# Patient Record
Sex: Female | Born: 1970 | Race: White | Hispanic: No | Marital: Married | Smoking: Never smoker
Health system: Southern US, Community
[De-identification: ages and names within clinical notes are randomized; demographics above are authoritative.]

## PROBLEM LIST (undated history)

## (undated) DIAGNOSIS — R32 Unspecified urinary incontinence: Secondary | ICD-10-CM

## (undated) DIAGNOSIS — J302 Other seasonal allergic rhinitis: Secondary | ICD-10-CM

## (undated) DIAGNOSIS — N029 Recurrent and persistent hematuria with unspecified morphologic changes: Secondary | ICD-10-CM

## (undated) DIAGNOSIS — R12 Heartburn: Secondary | ICD-10-CM

## (undated) DIAGNOSIS — K589 Irritable bowel syndrome without diarrhea: Secondary | ICD-10-CM

## (undated) HISTORY — DX: Recurrent and persistent hematuria with unspecified morphologic changes: N02.9

## (undated) HISTORY — DX: Unspecified urinary incontinence: R32

## (undated) HISTORY — DX: Irritable bowel syndrome without diarrhea: K58.9

## (undated) HISTORY — DX: Heartburn: R12

## (undated) HISTORY — DX: Other seasonal allergic rhinitis: J30.2

---

## 1991-03-08 HISTORY — PX: WISDOM TOOTH EXTRACTION: SHX21

## 1998-03-07 HISTORY — PX: COLONOSCOPY: SHX174

## 2007-08-06 HISTORY — PX: DILATION AND CURETTAGE OF UTERUS: SHX78

## 2008-08-05 ENCOUNTER — Other Ambulatory Visit: Admission: RE | Admit: 2008-08-05 | Discharge: 2008-08-05 | Payer: Self-pay | Admitting: Obstetrics and Gynecology

## 2009-10-01 ENCOUNTER — Other Ambulatory Visit: Admission: RE | Admit: 2009-10-01 | Discharge: 2009-10-01 | Payer: Self-pay | Admitting: Obstetrics and Gynecology

## 2010-07-07 ENCOUNTER — Other Ambulatory Visit (HOSPITAL_COMMUNITY)
Admission: RE | Admit: 2010-07-07 | Discharge: 2010-07-07 | Disposition: A | Discharge status: Home / Self Care | Payer: Managed Care, Other (non HMO) | Source: Ambulatory Visit | Attending: Obstetrics and Gynecology | Admitting: Obstetrics and Gynecology

## 2010-07-07 DIAGNOSIS — Z01419 Encounter for gynecological examination (general) (routine) without abnormal findings: Secondary | ICD-10-CM | POA: Insufficient documentation

## 2010-11-05 ENCOUNTER — Other Ambulatory Visit: Payer: Self-pay | Admitting: Obstetrics and Gynecology

## 2010-11-05 DIAGNOSIS — Z1231 Encounter for screening mammogram for malignant neoplasm of breast: Secondary | ICD-10-CM

## 2010-11-24 ENCOUNTER — Ambulatory Visit
Admission: RE | Admit: 2010-11-24 | Discharge: 2010-11-24 | Disposition: A | Discharge status: Home / Self Care | Payer: Managed Care, Other (non HMO) | Source: Ambulatory Visit | Attending: Obstetrics and Gynecology | Admitting: Obstetrics and Gynecology

## 2010-11-24 DIAGNOSIS — Z1231 Encounter for screening mammogram for malignant neoplasm of breast: Secondary | ICD-10-CM

## 2010-11-30 ENCOUNTER — Other Ambulatory Visit: Payer: Self-pay | Admitting: Obstetrics and Gynecology

## 2010-11-30 DIAGNOSIS — R928 Other abnormal and inconclusive findings on diagnostic imaging of breast: Secondary | ICD-10-CM

## 2010-12-09 ENCOUNTER — Ambulatory Visit
Admission: RE | Admit: 2010-12-09 | Discharge: 2010-12-09 | Disposition: A | Discharge status: Home / Self Care | Payer: Managed Care, Other (non HMO) | Source: Ambulatory Visit | Attending: Obstetrics and Gynecology | Admitting: Obstetrics and Gynecology

## 2010-12-09 DIAGNOSIS — R928 Other abnormal and inconclusive findings on diagnostic imaging of breast: Secondary | ICD-10-CM

## 2011-05-24 ENCOUNTER — Other Ambulatory Visit (HOSPITAL_COMMUNITY)
Admission: RE | Admit: 2011-05-24 | Discharge: 2011-05-24 | Disposition: A | Discharge status: Home / Self Care | Payer: Managed Care, Other (non HMO) | Source: Ambulatory Visit | Attending: Obstetrics and Gynecology | Admitting: Obstetrics and Gynecology

## 2011-05-24 DIAGNOSIS — Z01419 Encounter for gynecological examination (general) (routine) without abnormal findings: Secondary | ICD-10-CM | POA: Insufficient documentation

## 2011-11-10 ENCOUNTER — Other Ambulatory Visit: Payer: Self-pay | Admitting: Obstetrics and Gynecology

## 2011-11-11 ENCOUNTER — Other Ambulatory Visit: Payer: Self-pay | Admitting: Obstetrics and Gynecology

## 2011-11-11 DIAGNOSIS — Z1231 Encounter for screening mammogram for malignant neoplasm of breast: Secondary | ICD-10-CM

## 2012-01-10 ENCOUNTER — Ambulatory Visit
Admission: RE | Admit: 2012-01-10 | Discharge: 2012-01-10 | Disposition: A | Discharge status: Home / Self Care | Payer: Managed Care, Other (non HMO) | Source: Ambulatory Visit | Attending: Obstetrics and Gynecology | Admitting: Obstetrics and Gynecology

## 2012-01-10 DIAGNOSIS — Z1231 Encounter for screening mammogram for malignant neoplasm of breast: Secondary | ICD-10-CM

## 2012-10-04 ENCOUNTER — Other Ambulatory Visit: Payer: Self-pay | Admitting: Obstetrics and Gynecology

## 2012-10-04 ENCOUNTER — Other Ambulatory Visit (HOSPITAL_COMMUNITY)
Admission: RE | Admit: 2012-10-04 | Discharge: 2012-10-04 | Disposition: A | Discharge status: Home / Self Care | Payer: Managed Care, Other (non HMO) | Source: Ambulatory Visit | Attending: Obstetrics and Gynecology | Admitting: Obstetrics and Gynecology

## 2012-10-04 DIAGNOSIS — N644 Mastodynia: Secondary | ICD-10-CM

## 2012-10-04 DIAGNOSIS — Z1151 Encounter for screening for human papillomavirus (HPV): Secondary | ICD-10-CM | POA: Insufficient documentation

## 2012-10-04 DIAGNOSIS — Z01419 Encounter for gynecological examination (general) (routine) without abnormal findings: Secondary | ICD-10-CM | POA: Insufficient documentation

## 2012-10-19 ENCOUNTER — Ambulatory Visit
Admission: RE | Admit: 2012-10-19 | Discharge: 2012-10-19 | Disposition: A | Discharge status: Home / Self Care | Payer: Managed Care, Other (non HMO) | Source: Ambulatory Visit | Attending: Obstetrics and Gynecology | Admitting: Obstetrics and Gynecology

## 2012-10-19 DIAGNOSIS — N644 Mastodynia: Secondary | ICD-10-CM

## 2013-01-01 ENCOUNTER — Other Ambulatory Visit: Payer: Self-pay

## 2013-01-01 DIAGNOSIS — Z1231 Encounter for screening mammogram for malignant neoplasm of breast: Secondary | ICD-10-CM

## 2013-01-23 ENCOUNTER — Ambulatory Visit
Admission: RE | Admit: 2013-01-23 | Discharge: 2013-01-23 | Disposition: A | Discharge status: Home / Self Care | Payer: Managed Care, Other (non HMO) | Source: Ambulatory Visit

## 2013-01-23 DIAGNOSIS — Z1231 Encounter for screening mammogram for malignant neoplasm of breast: Secondary | ICD-10-CM

## 2013-11-29 ENCOUNTER — Other Ambulatory Visit (HOSPITAL_COMMUNITY)
Admission: RE | Admit: 2013-11-29 | Discharge: 2013-11-29 | Disposition: A | Discharge status: Home / Self Care | Payer: Managed Care, Other (non HMO) | Source: Ambulatory Visit | Attending: Obstetrics and Gynecology | Admitting: Obstetrics and Gynecology

## 2013-11-29 ENCOUNTER — Other Ambulatory Visit: Payer: Self-pay | Admitting: Obstetrics and Gynecology

## 2013-11-29 DIAGNOSIS — Z01419 Encounter for gynecological examination (general) (routine) without abnormal findings: Secondary | ICD-10-CM | POA: Diagnosis not present

## 2013-12-02 LAB — CYTOLOGY - PAP

## 2013-12-20 ENCOUNTER — Other Ambulatory Visit: Payer: Self-pay

## 2013-12-20 DIAGNOSIS — Z1231 Encounter for screening mammogram for malignant neoplasm of breast: Secondary | ICD-10-CM

## 2014-01-20 ENCOUNTER — Ambulatory Visit (INDEPENDENT_AMBULATORY_CARE_PROVIDER_SITE_OTHER): Payer: Managed Care, Other (non HMO) | Admitting: Family Medicine

## 2014-01-20 ENCOUNTER — Encounter: Payer: Self-pay | Admitting: Family Medicine

## 2014-01-20 VITALS — BP 118/70 | HR 72 | Temp 98.0°F | Ht 66.5 in | Wt 158.5 lb

## 2014-01-20 DIAGNOSIS — Z0000 Encounter for general adult medical examination without abnormal findings: Secondary | ICD-10-CM

## 2014-01-20 DIAGNOSIS — N029 Recurrent and persistent hematuria with unspecified morphologic changes: Secondary | ICD-10-CM

## 2014-01-20 NOTE — Assessment & Plan Note (Signed)
Preventative protocols reviewed and updated unless pt declined. Discussed healthy diet and lifestyle.  

## 2014-01-20 NOTE — Progress Notes (Signed)
Pre visit review using our clinic review tool, if applicable. No additional management support is needed unless otherwise documented below in the visit note. 

## 2014-01-20 NOTE — Patient Instructions (Signed)
Good to meet you today, call us with questions. Return as needed or in 2 years for next physical. Blood work today.

## 2014-01-20 NOTE — Progress Notes (Signed)
BP 118/70 mmHg  Pulse 72  Temp(Src) 98 F (36.7 C) (Oral)  Ht 5' 6.5" (1.689 m)  Wt 158 lb 8 oz (71.895 kg)  BMI 25.20 kg/m2  LMP 12/31/2013   CC: new pt to establish  Subjective:    Patient ID: Tiffany Zamora, female    DOB: 10/18/1970, 43 y.o.   MRN: [id number]  HPI: Tiffany Zamora is a 43 y.o. female presenting on 01/20/2014 for Establish Care   Wife of Carola RhineJames Iannelli  GERD - diet controlled  Preventative: Well woman with OBGYN 11/2013 (Dr Gerald Leitzara Cole), normal pap Mammogram 01/2013 - pending Friday Colonoscopy for IBS - done ~2000 Flu shot - declines Tetanus 2006 G2P1 Seat belt use discussed Sunscreen use discussed. No changing moles.  Wife of Fayrene FearingJames "Alycia RossettiRyan" Naser and daughter and dog Occupation: stay at home mom Edu: master's in criminology Activity: exercises regularly 4-5x/wk Diet: good water, vegetables daily  Relevant past medical, surgical, family and social history reviewed and updated as indicated.  Allergies and medications reviewed and updated. No current outpatient prescriptions on file prior to visit.   No current facility-administered medications on file prior to visit.    Review of Systems  Constitutional: Negative for fever, chills, activity change, appetite change, fatigue and unexpected weight change.  HENT: Negative for hearing loss.   Eyes: Negative for visual disturbance.  Respiratory: Negative for cough, chest tightness, shortness of breath and wheezing.   Cardiovascular: Negative for chest pain, palpitations and leg swelling.  Gastrointestinal: Negative for nausea, vomiting, abdominal pain, diarrhea, constipation, blood in stool and abdominal distention.  Genitourinary: Negative for hematuria and difficulty urinating.  Musculoskeletal: Negative for myalgias, arthralgias and neck pain.  Skin: Negative for rash.  Neurological: Negative for dizziness, seizures, syncope and headaches.  Hematological: Negative for adenopathy. Does not bruise/bleed  easily.  Psychiatric/Behavioral: Negative for dysphoric mood. The patient is not nervous/anxious.    Per HPI unless specifically indicated above    Objective:    BP 118/70 mmHg  Pulse 72  Temp(Src) 98 F (36.7 C) (Oral)  Ht 5' 6.5" (1.689 m)  Wt 158 lb 8 oz (71.895 kg)  BMI 25.20 kg/m2  LMP 12/31/2013  Physical Exam  Constitutional: She is oriented to person, place, and time. She appears well-developed and well-nourished. No distress.  HENT:  Head: Normocephalic and atraumatic.  Right Ear: Hearing, tympanic membrane, external ear and ear canal normal.  Left Ear: Hearing, tympanic membrane, external ear and ear canal normal.  Nose: Nose normal.  Mouth/Throat: Uvula is midline, oropharynx is clear and moist and mucous membranes are normal. No oropharyngeal exudate, posterior oropharyngeal edema or posterior oropharyngeal erythema.  Eyes: Conjunctivae and EOM are normal. Pupils are equal, round, and reactive to light. No scleral icterus.  Neck: Normal range of motion. Neck supple. No thyromegaly present.  Cardiovascular: Normal rate, regular rhythm, normal heart sounds and intact distal pulses.   No murmur heard. Pulses:      Radial pulses are 2+ on the right side, and 2+ on the left side.  Pulmonary/Chest: Effort normal and breath sounds normal. No respiratory distress. She has no wheezes. She has no rales.  Breast - deferred, per OBGYN  Abdominal: Soft. Bowel sounds are normal. She exhibits no distension and no mass. There is no tenderness. There is no rebound and no guarding.  Genitourinary:  Deferred - per OBGYN  Musculoskeletal: Normal range of motion. She exhibits no edema.  Lymphadenopathy:    She has no cervical adenopathy.  Neurological: She  is alert and oriented to person, place, and time.  CN grossly intact, station and gait intact  Skin: Skin is warm and dry. No rash noted.  Psychiatric: She has a normal mood and affect. Her behavior is normal. Judgment and thought  content normal.  Nursing note and vitals reviewed.  Results for orders placed or performed in visit on 11/29/13  Cytology - PAP  Result Value Ref Range   CYTOLOGY - PAP PAP RESULT       Assessment & Plan:   Problem List Items Addressed This Visit    Health maintenance examination - Primary    Preventative protocols reviewed and updated unless pt declined. Discussed healthy diet and lifestyle.    Relevant Orders      Lipid panel      Basic metabolic panel      TSH   Benign hematuria       Follow up plan: Return in about 2 years (around 01/21/2016), or as needed, for annual exam, prior fasting for blood work.

## 2014-01-21 LAB — BASIC METABOLIC PANEL
BUN: 13 mg/dL (ref 6–23)
CO2: 18 mEq/L — ABNORMAL LOW (ref 19–32)
Calcium: 9.4 mg/dL (ref 8.4–10.5)
Chloride: 110 mEq/L (ref 96–112)
Creatinine, Ser: 0.8 mg/dL (ref 0.4–1.2)
GFR: 89.24 mL/min (ref >60.00)
Glucose, Bld: 76 mg/dL (ref 70–99)
Potassium: 4.7 mEq/L (ref 3.5–5.1)
Sodium: 140 mEq/L (ref 135–145)

## 2014-01-21 LAB — LIPID PANEL
Cholesterol: 181 mg/dL (ref 0–200)
HDL: 61.5 mg/dL (ref >39.00)
LDL Cholesterol: 89 mg/dL (ref 0–99)
NonHDL: 119.5
Total CHOL/HDL Ratio: 3
Triglycerides: 153 mg/dL — ABNORMAL HIGH (ref 0.0–149.0)
VLDL: 30.6 mg/dL (ref 0.0–40.0)

## 2014-01-21 LAB — TSH: TSH: 1.3 u[IU]/mL (ref 0.35–4.50)

## 2014-01-22 ENCOUNTER — Encounter: Payer: Self-pay | Admitting: *Deleted

## 2014-01-24 ENCOUNTER — Ambulatory Visit
Admission: RE | Admit: 2014-01-24 | Discharge: 2014-01-24 | Disposition: A | Discharge status: Home / Self Care | Payer: Managed Care, Other (non HMO) | Source: Ambulatory Visit

## 2014-01-24 ENCOUNTER — Other Ambulatory Visit: Payer: Self-pay

## 2014-01-24 DIAGNOSIS — Z1231 Encounter for screening mammogram for malignant neoplasm of breast: Secondary | ICD-10-CM

## 2014-07-24 ENCOUNTER — Ambulatory Visit (INDEPENDENT_AMBULATORY_CARE_PROVIDER_SITE_OTHER): Payer: BLUE CROSS/BLUE SHIELD | Admitting: Family Medicine

## 2014-07-24 ENCOUNTER — Encounter: Payer: Self-pay | Admitting: Family Medicine

## 2014-07-24 VITALS — BP 124/62 | HR 73 | Temp 97.9°F | Wt 164.8 lb

## 2014-07-24 DIAGNOSIS — J209 Acute bronchitis, unspecified: Secondary | ICD-10-CM | POA: Diagnosis not present

## 2014-07-24 DIAGNOSIS — R05 Cough: Secondary | ICD-10-CM | POA: Insufficient documentation

## 2014-07-24 DIAGNOSIS — R059 Cough, unspecified: Secondary | ICD-10-CM | POA: Insufficient documentation

## 2014-07-24 MED ORDER — AZITHROMYCIN 250 MG PO TABS: ORAL_TABLET | ORAL | Status: DC

## 2014-07-24 NOTE — Assessment & Plan Note (Signed)
Given duration, cover for atypicals with zpack. Discussed ibuprofen, mucinex, OTC cough suppressant. Pt declines Rx cough med. Update if not improving with treatment. Pt agrees with plan.

## 2014-07-24 NOTE — Progress Notes (Signed)
Pre visit review using our clinic review tool, if applicable. No additional management support is needed unless otherwise documented below in the visit note. 

## 2014-07-24 NOTE — Patient Instructions (Signed)
I think we have developed bronchitis - treat with zpack and plenty of fluids and rest. May use ibuprofen 400-600mg  twice daily with food. May use plain mucinex over the counter with plenty of water to help mobilize mucous.  Watch for fever > 101, worsening productive cough, or worsening after initial improvement Let us know if not improving with treatment.

## 2014-07-24 NOTE — Progress Notes (Signed)
   BP 124/62 mmHg  Pulse 73  Temp(Src) 97.9 F (36.6 C) (Oral)  Wt 164 lb 12 oz (74.73 kg)  SpO2 97%  LMP 07/02/2014   CC: cough  Subjective:    Patient ID: Tiffany Zamora, female    DOB: 11/01/1970, 44 y.o.   MRN: [id number]  HPI: Tiffany Zamora is a 44 y.o. female presenting on 07/24/2014 for Cough   3 wk h/o productive cough of yellow/green mucous. Cough has plateaued. Noticing AM nasal and chest congestion. + dyspnea and possible wheeze. + coughing fits and post tussive gagging. Trouble sleeping 2/2 cough.   No fevers/chills, rashes, ear or tooth pain, headaches, PNdrainage, ST.   No sick contacts at home.  No smokers at home.  No h/o asthma. + allergic rhinitis but stable.   Did not receive flu shot this year.   Relevant past medical, surgical, family and social history reviewed and updated as indicated. Interim medical history since our last visit reviewed. Allergies and medications reviewed and updated. Current Outpatient Prescriptions on File Prior to Visit  Medication Sig  . levonorgestrel-ethinyl estradiol (AVIANE,ALESSE,LESSINA) 0.1-20 MG-MCG tablet Take 1 tablet by mouth daily.   No current facility-administered medications on file prior to visit.    Review of Systems Per HPI unless specifically indicated above     Objective:    BP 124/62 mmHg  Pulse 73  Temp(Src) 97.9 F (36.6 C) (Oral)  Wt 164 lb 12 oz (74.73 kg)  SpO2 97%  LMP 07/02/2014  Wt Readings from Last 3 Encounters:  07/24/14 164 lb 12 oz (74.73 kg)  01/20/14 158 lb 8 oz (71.895 kg)    Physical Exam  Constitutional: She appears well-developed and well-nourished. No distress.  HENT:  Head: Normocephalic and atraumatic.  Right Ear: Hearing, tympanic membrane, external ear and ear canal normal.  Left Ear: Hearing, tympanic membrane, external ear and ear canal normal.  Nose: No mucosal edema or rhinorrhea. Right sinus exhibits no maxillary sinus tenderness and no frontal sinus tenderness. Left  sinus exhibits no maxillary sinus tenderness and no frontal sinus tenderness.  Mouth/Throat: Uvula is midline, oropharynx is clear and moist and mucous membranes are normal. No oropharyngeal exudate, posterior oropharyngeal edema, posterior oropharyngeal erythema or tonsillar abscesses.  Eyes: Conjunctivae and EOM are normal. Pupils are equal, round, and reactive to light. No scleral icterus.  Neck: Normal range of motion. Neck supple.  Cardiovascular: Normal rate, regular rhythm, normal heart sounds and intact distal pulses.   No murmur heard. Pulmonary/Chest: Effort normal and breath sounds normal. No respiratory distress. She has no wheezes. She has no rales.  Deep rattling cough present  Lymphadenopathy:    She has no cervical adenopathy.  Skin: Skin is warm and dry. No rash noted.  Nursing note and vitals reviewed.     Assessment & Plan:   Problem List Items Addressed This Visit    Acute bronchitis - Primary    Given duration, cover for atypicals with zpack. Discussed ibuprofen, mucinex, OTC cough suppressant. Pt declines Rx cough med. Update if not improving with treatment. Pt agrees with plan.          Follow up plan: Return if symptoms worsen or fail to improve.

## 2014-08-05 ENCOUNTER — Ambulatory Visit: Payer: BLUE CROSS/BLUE SHIELD | Admitting: Primary Care

## 2014-08-05 ENCOUNTER — Ambulatory Visit (INDEPENDENT_AMBULATORY_CARE_PROVIDER_SITE_OTHER): Payer: BLUE CROSS/BLUE SHIELD | Admitting: Family Medicine

## 2014-08-05 ENCOUNTER — Encounter: Payer: Self-pay | Admitting: Family Medicine

## 2014-08-05 VITALS — BP 110/60 | HR 64 | Temp 98.4°F | Wt 161.5 lb

## 2014-08-05 DIAGNOSIS — R05 Cough: Secondary | ICD-10-CM | POA: Diagnosis not present

## 2014-08-05 DIAGNOSIS — R059 Cough, unspecified: Secondary | ICD-10-CM

## 2014-08-05 MED ORDER — LORATADINE 10 MG PO TABS: 10.0000 mg | ORAL_TABLET | Freq: Every day | ORAL | Status: DC | PRN

## 2014-08-05 MED ORDER — FLUTICASONE PROPIONATE 50 MCG/ACT NA SUSP: 2.0000 | Freq: Every day | NASAL | Status: DC

## 2014-08-05 NOTE — Assessment & Plan Note (Signed)
Initial bronchitis seems to have resolved - now persistent cough likely due to allergic rhinitis. rec start claritin daily, flonase, nasal saline irrigation. Discussed allergen avoidance. She does have persistent sinus congestion - advised to let us know if persistent despite above to consider augmentin course to cover sinusitis.

## 2014-08-05 NOTE — Progress Notes (Signed)
   BP 110/60 mmHg  Pulse 64  Temp(Src) 98.4 F (36.9 C) (Oral)  Wt 161 lb 8 oz (73.256 kg)  LMP 07/29/2014   CC: f/u visit  Subjective:    Patient ID: Tiffany Zamora, female    DOB: 07/31/1970, 44 y.o.   MRN: [id number]  HPI: Tiffany BeneKelly Boven is a 44 y.o. female presenting on 08/05/2014 for Follow-up   See prior note for details. Seen here 07/24/2014 with dx bronchitis after 3 wk of productive cough, treated with zpack antibiotic + ibuprofen/mucinex/OTC cough suppressant.   1+ mo h/o productive cough. Prior note reviewed. Cough seems to be drying up some. Persistent nasal congestion.   No GERD sxs. No significant GERD hx. No asthma history.  Here and there allergic rhinitis symptoms but significant.   Relevant past medical, surgical, family and social history reviewed and updated as indicated. Interim medical history since our last visit reviewed. Allergies and medications reviewed and updated. Current Outpatient Prescriptions on File Prior to Visit  Medication Sig  . levonorgestrel-ethinyl estradiol (AVIANE,ALESSE,LESSINA) 0.1-20 MG-MCG tablet Take 1 tablet by mouth daily.   No current facility-administered medications on file prior to visit.    Review of Systems Per HPI unless specifically indicated above     Objective:    BP 110/60 mmHg  Pulse 64  Temp(Src) 98.4 F (36.9 C) (Oral)  Wt 161 lb 8 oz (73.256 kg)  LMP 07/29/2014  Wt Readings from Last 3 Encounters:  08/05/14 161 lb 8 oz (73.256 kg)  07/24/14 164 lb 12 oz (74.73 kg)  01/20/14 158 lb 8 oz (71.895 kg)    Physical Exam  Constitutional: She appears well-developed and well-nourished. No distress.  HENT:  Head: Normocephalic and atraumatic.  Right Ear: Hearing, tympanic membrane, external ear and ear canal normal.  Left Ear: Hearing, tympanic membrane, external ear and ear canal normal.  Nose: Mucosal edema (L>R, pale and swollen turbinates) present. No rhinorrhea. Right sinus exhibits no maxillary sinus tenderness  and no frontal sinus tenderness. Left sinus exhibits no maxillary sinus tenderness and no frontal sinus tenderness.  Mouth/Throat: Uvula is midline, oropharynx is clear and moist and mucous membranes are normal. No oropharyngeal exudate, posterior oropharyngeal edema, posterior oropharyngeal erythema or tonsillar abscesses.  Eyes: Conjunctivae and EOM are normal. Pupils are equal, round, and reactive to light. No scleral icterus.  Neck: Normal range of motion. Neck supple.  Cardiovascular: Normal rate, regular rhythm, normal heart sounds and intact distal pulses.   No murmur heard. Pulmonary/Chest: Effort normal and breath sounds normal. No respiratory distress. She has no wheezes. She has no rales.  Lymphadenopathy:    She has no cervical adenopathy.  Skin: Skin is warm and dry. No rash noted.  Nursing note and vitals reviewed.     Assessment & Plan:   Problem List Items Addressed This Visit    Cough - Primary    Initial bronchitis seems to have resolved - now persistent cough likely due to allergic rhinitis. rec start claritin daily, flonase, nasal saline irrigation. Discussed allergen avoidance. She does have persistent sinus congestion - advised to let us know if persistent despite above to consider augmentin course to cover sinusitis.          Follow up plan: Return if symptoms worsen or fail to improve.

## 2014-08-05 NOTE — Patient Instructions (Signed)
I think bronchitis has cleared up but persistent cough is from allergic rhinitis - start claritin + flonase daily + nasal saline irrigation. If persistent headache or worsening congestion let me know to prescribe antibiotic for possible sinus infection - but I think this cough is more allergic.

## 2014-08-05 NOTE — Progress Notes (Signed)
Pre visit review using our clinic review tool, if applicable. No additional management support is needed unless otherwise documented below in the visit note. 

## 2014-08-07 ENCOUNTER — Ambulatory Visit: Payer: BLUE CROSS/BLUE SHIELD | Admitting: Family Medicine

## 2014-12-01 ENCOUNTER — Other Ambulatory Visit: Payer: Self-pay | Admitting: Obstetrics and Gynecology

## 2014-12-01 ENCOUNTER — Other Ambulatory Visit (HOSPITAL_COMMUNITY)
Admission: RE | Admit: 2014-12-01 | Discharge: 2014-12-01 | Disposition: A | Discharge status: Home / Self Care | Payer: BLUE CROSS/BLUE SHIELD | Source: Ambulatory Visit | Attending: Obstetrics and Gynecology | Admitting: Obstetrics and Gynecology

## 2014-12-01 DIAGNOSIS — Z01419 Encounter for gynecological examination (general) (routine) without abnormal findings: Secondary | ICD-10-CM | POA: Insufficient documentation

## 2014-12-02 LAB — CYTOLOGY - PAP

## 2015-02-11 ENCOUNTER — Other Ambulatory Visit: Payer: Self-pay

## 2015-02-11 DIAGNOSIS — Z1231 Encounter for screening mammogram for malignant neoplasm of breast: Secondary | ICD-10-CM

## 2015-02-27 ENCOUNTER — Ambulatory Visit
Admission: RE | Admit: 2015-02-27 | Discharge: 2015-02-27 | Disposition: A | Discharge status: Home / Self Care | Payer: BLUE CROSS/BLUE SHIELD | Source: Ambulatory Visit

## 2015-02-27 DIAGNOSIS — Z1231 Encounter for screening mammogram for malignant neoplasm of breast: Secondary | ICD-10-CM

## 2015-07-31 DIAGNOSIS — M7541 Impingement syndrome of right shoulder: Secondary | ICD-10-CM | POA: Diagnosis not present

## 2015-08-27 DIAGNOSIS — M25611 Stiffness of right shoulder, not elsewhere classified: Secondary | ICD-10-CM | POA: Diagnosis not present

## 2015-08-27 DIAGNOSIS — M25511 Pain in right shoulder: Secondary | ICD-10-CM | POA: Diagnosis not present

## 2015-08-27 DIAGNOSIS — M7541 Impingement syndrome of right shoulder: Secondary | ICD-10-CM | POA: Diagnosis not present

## 2015-09-20 ENCOUNTER — Other Ambulatory Visit: Payer: Self-pay | Admitting: Family Medicine

## 2015-09-20 DIAGNOSIS — E785 Hyperlipidemia, unspecified: Secondary | ICD-10-CM

## 2015-09-21 ENCOUNTER — Other Ambulatory Visit (INDEPENDENT_AMBULATORY_CARE_PROVIDER_SITE_OTHER): Payer: BLUE CROSS/BLUE SHIELD

## 2015-09-21 DIAGNOSIS — E785 Hyperlipidemia, unspecified: Secondary | ICD-10-CM | POA: Diagnosis not present

## 2015-09-21 LAB — BASIC METABOLIC PANEL
BUN: 12 mg/dL (ref 6–23)
CO2: 28 mEq/L (ref 19–32)
Calcium: 9.3 mg/dL (ref 8.4–10.5)
Chloride: 104 mEq/L (ref 96–112)
Creatinine, Ser: 0.69 mg/dL (ref 0.40–1.20)
GFR: 97.52 mL/min (ref >60.00)
Glucose, Bld: 86 mg/dL (ref 70–99)
Potassium: 3.6 mEq/L (ref 3.5–5.1)
Sodium: 139 mEq/L (ref 135–145)

## 2015-09-21 LAB — LIPID PANEL
Cholesterol: 171 mg/dL (ref 0–200)
HDL: 45.9 mg/dL (ref >39.00)
NonHDL: 125.56
Total CHOL/HDL Ratio: 4
Triglycerides: 257 mg/dL — ABNORMAL HIGH (ref 0.0–149.0)
VLDL: 51.4 mg/dL — ABNORMAL HIGH (ref 0.0–40.0)

## 2015-09-21 LAB — LDL CHOLESTEROL, DIRECT: Direct LDL: 93 mg/dL

## 2015-09-23 ENCOUNTER — Encounter: Payer: BLUE CROSS/BLUE SHIELD | Admitting: Family Medicine

## 2015-09-25 ENCOUNTER — Encounter: Payer: Self-pay | Admitting: Family Medicine

## 2015-09-25 ENCOUNTER — Ambulatory Visit (INDEPENDENT_AMBULATORY_CARE_PROVIDER_SITE_OTHER): Payer: BLUE CROSS/BLUE SHIELD | Admitting: Family Medicine

## 2015-09-25 VITALS — BP 126/70 | HR 72 | Temp 98.7°F | Ht 66.5 in | Wt 163.8 lb

## 2015-09-25 DIAGNOSIS — E781 Pure hyperglyceridemia: Secondary | ICD-10-CM

## 2015-09-25 DIAGNOSIS — K589 Irritable bowel syndrome without diarrhea: Secondary | ICD-10-CM

## 2015-09-25 DIAGNOSIS — Z0000 Encounter for general adult medical examination without abnormal findings: Secondary | ICD-10-CM | POA: Diagnosis not present

## 2015-09-25 HISTORY — DX: Irritable bowel syndrome, unspecified: K58.9

## 2015-09-25 NOTE — Assessment & Plan Note (Signed)
New. Pt attributes to recent traveling and eating out. Will continue to monitor. Discussed diet changes to improve level.s

## 2015-09-25 NOTE — Progress Notes (Signed)
BP 126/70 mmHg  Pulse 72  Temp(Src) 98.7 F (37.1 C) (Oral)  Ht 5' 6.5" (1.689 m)  Wt 163 lb 12 oz (74.277 kg)  BMI 26.04 kg/m2  LMP 09/16/2015   CC: CPE  Subjective:    Patient ID: Tiffany Zamora, female    DOB: 10/29/1970, 45 y.o.   MRN: [id number]  HPI: Tiffany Zamora is a 45 y.o. 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  Preventative: Well woman with OBGYN 11/2014 (Dr Gerald Leitzara Cole), normal pap Mammogram 02/2015.  Colonoscopy for IBS - done ~2000.  Flu shot - declines Tetanus 2006 G2P1 LMP - last Wednesday. Regular cycles, light period  Seat belt use discussed Sunscreen use discussed. No changing moles.   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 Interim medical history since our last visit reviewed. Allergies and medications reviewed and updated. Current Outpatient Prescriptions on File Prior to Visit  Medication Sig  . fluticasone (FLONASE) 50 MCG/ACT nasal spray Place 2 sprays into both nostrils daily. (Patient not taking: Reported on 09/25/2015)  . loratadine (CLARITIN) 10 MG tablet Take 1 tablet (10 mg total) by mouth daily as needed for allergies. (Patient not taking: Reported on 09/25/2015)   No current facility-administered medications on file prior to visit.    Review of Systems  Constitutional: Negative for fever, chills, activity change, appetite change, fatigue and unexpected weight change.  HENT: Negative for hearing loss.   Eyes: Negative for visual disturbance.  Respiratory: Negative for cough, chest tightness, shortness of breath and wheezing.   Cardiovascular: Negative for chest pain, palpitations and leg swelling.  Gastrointestinal: Negative for nausea, vomiting, abdominal pain, diarrhea,  constipation, blood in stool and abdominal distention.  Genitourinary: Negative for hematuria and difficulty urinating.  Musculoskeletal: Negative for myalgias, arthralgias and neck pain.  Skin: Negative for rash.  Neurological: Negative for dizziness, seizures, syncope and headaches.  Hematological: Negative for adenopathy. Does not bruise/bleed easily.  Psychiatric/Behavioral: Negative for dysphoric mood. The patient is not nervous/anxious.    Per HPI unless specifically indicated in ROS section     Objective:    BP 126/70 mmHg  Pulse 72  Temp(Src) 98.7 F (37.1 C) (Oral)  Ht 5' 6.5" (1.689 m)  Wt 163 lb 12 oz (74.277 kg)  BMI 26.04 kg/m2  LMP 09/16/2015  Wt Readings from Last 3 Encounters:  09/25/15 163 lb 12 oz (74.277 kg)  08/05/14 161 lb 8 oz (73.256 kg)  07/24/14 164 lb 12 oz (74.73 kg)    Physical Exam  Constitutional: She is oriented to person, place, and time. She appears well-developed and well-nourished. No distress.  HENT:  Head: Normocephalic and atraumatic.  Right Ear: Hearing, tympanic membrane, external ear and ear canal normal.  Left Ear: Hearing, tympanic membrane, external ear and ear canal normal.  Nose: Nose normal.  Mouth/Throat: Uvula is midline, oropharynx is clear and moist and mucous membranes are normal. No oropharyngeal exudate, posterior oropharyngeal edema or posterior oropharyngeal erythema.  Eyes: Conjunctivae and EOM are normal. Pupils are equal, round, and reactive to light. No scleral icterus.  Neck: Normal range of motion. Neck supple. No thyromegaly present.  Cardiovascular: Normal rate, regular rhythm, normal heart sounds and intact distal pulses.   No murmur heard. Pulses:      Radial  pulses are 2+ on the right side, and 2+ on the left side.  Pulmonary/Chest: Effort normal and breath sounds normal. No respiratory distress. She has no wheezes. She has no rales.  Abdominal: Soft. Bowel sounds are normal. She exhibits no distension and no  mass. There is no tenderness. There is no rebound and no guarding.  Musculoskeletal: Normal range of motion. She exhibits no edema.  Lymphadenopathy:    She has no cervical adenopathy.  Neurological: She is alert and oriented to person, place, and time.  CN grossly intact, station and gait intact  Skin: Skin is warm and dry. No rash noted.  Psychiatric: She has a normal mood and affect. Her behavior is normal. Judgment and thought content normal.  Nursing note and vitals reviewed.  Results for orders placed or performed in visit on 09/21/15  Lipid panel  Result Value Ref Range   Cholesterol 171 0 - 200 mg/dL   Triglycerides 161.0 (H) 0.0 - 149.0 mg/dL   HDL 96.04 >54.09 mg/dL   VLDL 81.1 (H) 0.0 - 91.4 mg/dL   Total CHOL/HDL Ratio 4    NonHDL 125.56   Basic metabolic panel  Result Value Ref Range   Sodium 139 135 - 145 mEq/L   Potassium 3.6 3.5 - 5.1 mEq/L   Chloride 104 96 - 112 mEq/L   CO2 28 19 - 32 mEq/L   Glucose, Bld 86 70 - 99 mg/dL   BUN 12 6 - 23 mg/dL   Creatinine, Ser 7.82 0.40 - 1.20 mg/dL   Calcium 9.3 8.4 - 95.6 mg/dL   GFR 21.30 >86.57 mL/min  LDL cholesterol, direct  Result Value Ref Range   Direct LDL 93.0 mg/dL      Assessment & Plan:   Problem List Items Addressed This Visit    Health maintenance examination - Primary    Preventative protocols reviewed and updated unless pt declined. Discussed healthy diet and lifestyle.       Hypertriglyceridemia    New. Pt attributes to recent traveling and eating out. Will continue to monitor. Discussed diet changes to improve level.s      IBS (irritable bowel syndrome)       Follow up plan: Return in about 1 year (around 09/24/2016), or as needed, for annual exam, prior fasting for blood work.  Eustaquio Boyden, MD

## 2015-09-25 NOTE — Patient Instructions (Signed)
You are doing well today Return as needed or in 1 year for next physical with labs  Health Maintenance, Female Adopting a healthy lifestyle and getting preventive care can go a long way to promote health and wellness. Talk with your health care provider about what schedule of regular examinations is right for you. This is a good chance for you to check in with your provider about disease prevention and staying healthy. In between checkups, there are plenty of things you can do on your own. Experts have done a lot of research about which lifestyle changes and preventive measures are most likely to keep you healthy. Ask your health care provider for more information. WEIGHT AND DIET  Eat a healthy diet  Be sure to include plenty of vegetables, fruits, low-fat dairy products, and lean protein.  Do not eat a lot of foods high in solid fats, added sugars, or salt.  Get regular exercise. This is one of the most important things you can do for your health.  Most adults should exercise for at least 150 minutes each week. The exercise should increase your heart rate and make you sweat (moderate-intensity exercise).  Most adults should also do strengthening exercises at least twice a week. This is in addition to the moderate-intensity exercise.  Maintain a healthy weight  Body mass index (BMI) is a measurement that can be used to identify possible weight problems. It estimates body fat based on height and weight. Your health care provider can help determine your BMI and help you achieve or maintain a healthy weight.  For females 43 years of age and older:   A BMI below 18.5 is considered underweight.  A BMI of 18.5 to 24.9 is normal.  A BMI of 25 to 29.9 is considered overweight.  A BMI of 30 and above is considered obese.  Watch levels of cholesterol and blood lipids  You should start having your blood tested for lipids and cholesterol at 46 years of age, then have this test every 5  years.  You may need to have your cholesterol levels checked more often if:  Your lipid or cholesterol levels are high.  You are older than 46 years of age.  You are at high risk for heart disease.  CANCER SCREENING   Lung Cancer  Lung cancer screening is recommended for adults 75-93 years old who are at high risk for lung cancer because of a history of smoking.  A yearly low-dose CT scan of the lungs is recommended for people who:  Currently smoke.  Have quit within the past 15 years.  Have at least a 30-pack-year history of smoking. A pack year is smoking an average of one pack of cigarettes a day for 1 year.  Yearly screening should continue until it has been 15 years since you quit.  Yearly screening should stop if you develop a health problem that would prevent you from having lung cancer treatment.  Breast Cancer  Practice breast self-awareness. This means understanding how your breasts normally appear and feel.  It also means doing regular breast self-exams. Let your health care provider know about any changes, no matter how small.  If you are in your 20s or 30s, you should have a clinical breast exam (CBE) by a health care provider every 1-3 years as part of a regular health exam.  If you are 17 or older, have a CBE every year. Also consider having a breast X-ray (mammogram) every year.  If you have  a family history of breast cancer, talk to your health care provider about genetic screening.  If you are at high risk for breast cancer, talk to your health care provider about having an MRI and a mammogram every year.  Breast cancer gene (BRCA) assessment is recommended for women who have family members with BRCA-related cancers. BRCA-related cancers include:  Breast.  Ovarian.  Tubal.  Peritoneal cancers.  Results of the assessment will determine the need for genetic counseling and BRCA1 and BRCA2 testing. Cervical Cancer Your health care provider may  recommend that you be screened regularly for cancer of the pelvic organs (ovaries, uterus, and vagina). This screening involves a pelvic examination, including checking for microscopic changes to the surface of your cervix (Pap test). You may be encouraged to have this screening done every 3 years, beginning at age 21.  For women ages 30-65, health care providers may recommend pelvic exams and Pap testing every 3 years, or they may recommend the Pap and pelvic exam, combined with testing for human papilloma virus (HPV), every 5 years. Some types of HPV increase your risk of cervical cancer. Testing for HPV may also be done on women of any age with unclear Pap test results.  Other health care providers may not recommend any screening for nonpregnant women who are considered low risk for pelvic cancer and who do not have symptoms. Ask your health care provider if a screening pelvic exam is right for you.  If you have had past treatment for cervical cancer or a condition that could lead to cancer, you need Pap tests and screening for cancer for at least 20 years after your treatment. If Pap tests have been discontinued, your risk factors (such as having a new sexual partner) need to be reassessed to determine if screening should resume. Some women have medical problems that increase the chance of getting cervical cancer. In these cases, your health care provider may recommend more frequent screening and Pap tests. Colorectal Cancer  This type of cancer can be detected and often prevented.  Routine colorectal cancer screening usually begins at 46 years of age and continues through 46 years of age.  Your health care provider may recommend screening at an earlier age if you have risk factors for colon cancer.  Your health care provider may also recommend using home test kits to check for hidden blood in the stool.  A small camera at the end of a tube can be used to examine your colon directly  (sigmoidoscopy or colonoscopy). This is done to check for the earliest forms of colorectal cancer.  Routine screening usually begins at age 50.  Direct examination of the colon should be repeated every 5-10 years through 46 years of age. However, you may need to be screened more often if early forms of precancerous polyps or small growths are found. Skin Cancer  Check your skin from head to toe regularly.  Tell your health care provider about any new moles or changes in moles, especially if there is a change in a mole's shape or color.  Also tell your health care provider if you have a mole that is larger than the size of a pencil eraser.  Always use sunscreen. Apply sunscreen liberally and repeatedly throughout the day.  Protect yourself by wearing long sleeves, pants, a wide-brimmed hat, and sunglasses whenever you are outside. HEART DISEASE, DIABETES, AND HIGH BLOOD PRESSURE   High blood pressure causes heart disease and increases the risk of stroke. High   blood pressure is more likely to develop in:  People who have blood pressure in the high end of the normal range (130-139/85-89 mm Hg).  People who are overweight or obese.  People who are African American.  If you are 18-39 years of age, have your blood pressure checked every 3-5 years. If you are 40 years of age or older, have your blood pressure checked every year. You should have your blood pressure measured twice--once when you are at a hospital or clinic, and once when you are not at a hospital or clinic. Record the average of the two measurements. To check your blood pressure when you are not at a hospital or clinic, you can use:  An automated blood pressure machine at a pharmacy.  A home blood pressure monitor.  If you are between 55 years and 79 years old, ask your health care provider if you should take aspirin to prevent strokes.  Have regular diabetes screenings. This involves taking a blood sample to check your  fasting blood sugar level.  If you are at a normal weight and have a low risk for diabetes, have this test once every three years after 45 years of age.  If you are overweight and have a high risk for diabetes, consider being tested at a younger age or more often. PREVENTING INFECTION  Hepatitis B  If you have a higher risk for hepatitis B, you should be screened for this virus. You are considered at high risk for hepatitis B if:  You were born in a country where hepatitis B is common. Ask your health care provider which countries are considered high risk.  Your parents were born in a high-risk country, and you have not been immunized against hepatitis B (hepatitis B vaccine).  You have HIV or AIDS.  You use needles to inject street drugs.  You live with someone who has hepatitis B.  You have had sex with someone who has hepatitis B.  You get hemodialysis treatment.  You take certain medicines for conditions, including cancer, organ transplantation, and autoimmune conditions. Hepatitis C  Blood testing is recommended for:  Everyone born from 1945 through 1965.  Anyone with known risk factors for hepatitis C. Sexually transmitted infections (STIs)  You should be screened for sexually transmitted infections (STIs) including gonorrhea and chlamydia if:  You are sexually active and are younger than 46 years of age.  You are older than 46 years of age and your health care provider tells you that you are at risk for this type of infection.  Your sexual activity has changed since you were last screened and you are at an increased risk for chlamydia or gonorrhea. Ask your health care provider if you are at risk.  If you do not have HIV, but are at risk, it may be recommended that you take a prescription medicine daily to prevent HIV infection. This is called pre-exposure prophylaxis (PrEP). You are considered at risk if:  You are sexually active and do not regularly use condoms or  know the HIV status of your partner(s).  You take drugs by injection.  You are sexually active with a partner who has HIV. Talk with your health care provider about whether you are at high risk of being infected with HIV. If you choose to begin PrEP, you should first be tested for HIV. You should then be tested every 3 months for as long as you are taking PrEP.  PREGNANCY   If you are   If you are premenopausal and you may become pregnant, ask your health care provider about preconception counseling.  If you may become pregnant, take 400 to 800 micrograms (mcg) of folic acid every day.  If you want to prevent pregnancy, talk to your health care provider about birth control (contraception). OSTEOPOROSIS AND MENOPAUSE   Osteoporosis is a disease in which the bones lose minerals and strength with aging. This can result in serious bone fractures. Your risk for osteoporosis can be identified using a bone density scan.  If you are 65 years of age or older, or if you are at risk for osteoporosis and fractures, ask your health care provider if you should be screened.  Ask your health care provider whether you should take a calcium or vitamin D supplement to lower your risk for osteoporosis.  Menopause may have certain physical symptoms and risks.  Hormone replacement therapy may reduce some of these symptoms and risks. Talk to your health care provider about whether hormone replacement therapy is right for you.  HOME CARE INSTRUCTIONS   Schedule regular health, dental, and eye exams.  Stay current with your immunizations.   Do not use any tobacco products including cigarettes, chewing tobacco, or electronic cigarettes.  If you are pregnant, do not drink alcohol.  If you are breastfeeding, limit how much and how often you drink alcohol.  Limit alcohol intake to no more than 1 drink per day for nonpregnant women. One drink equals 12 ounces of beer, 5 ounces of wine, or 1 ounces of hard  liquor.  Do not use street drugs.  Do not share needles.  Ask your health care provider for help if you need support or information about quitting drugs.  Tell your health care provider if you often feel depressed.  Tell your health care provider if you have ever been abused or do not feel safe at home.   This information is not intended to replace advice given to you by your health care provider. Make sure you discuss any questions you have with your health care provider.   Document Released: 09/06/2010 Document Revised: 03/14/2014 Document Reviewed: 01/23/2013 Elsevier Interactive Patient Education 2016 [employer]  

## 2015-09-25 NOTE — Assessment & Plan Note (Signed)
Preventative protocols reviewed and updated unless pt declined. Discussed healthy diet and lifestyle.  

## 2015-12-07 ENCOUNTER — Other Ambulatory Visit: Payer: Self-pay | Admitting: Obstetrics and Gynecology

## 2015-12-07 ENCOUNTER — Other Ambulatory Visit (HOSPITAL_COMMUNITY)
Admission: RE | Admit: 2015-12-07 | Discharge: 2015-12-07 | Disposition: A | Discharge status: Home / Self Care | Payer: BLUE CROSS/BLUE SHIELD | Source: Ambulatory Visit | Attending: Obstetrics and Gynecology | Admitting: Obstetrics and Gynecology

## 2015-12-07 DIAGNOSIS — Z1151 Encounter for screening for human papillomavirus (HPV): Secondary | ICD-10-CM | POA: Insufficient documentation

## 2015-12-07 DIAGNOSIS — Z01419 Encounter for gynecological examination (general) (routine) without abnormal findings: Secondary | ICD-10-CM | POA: Diagnosis not present

## 2015-12-10 LAB — CYTOLOGY - PAP

## 2016-01-19 DIAGNOSIS — H43812 Vitreous degeneration, left eye: Secondary | ICD-10-CM | POA: Diagnosis not present

## 2016-01-26 ENCOUNTER — Other Ambulatory Visit: Payer: Self-pay | Admitting: Obstetrics and Gynecology

## 2016-01-26 DIAGNOSIS — Z1231 Encounter for screening mammogram for malignant neoplasm of breast: Secondary | ICD-10-CM

## 2016-03-02 ENCOUNTER — Ambulatory Visit: Payer: BLUE CROSS/BLUE SHIELD

## 2016-03-02 DIAGNOSIS — H43812 Vitreous degeneration, left eye: Secondary | ICD-10-CM | POA: Diagnosis not present

## 2016-03-04 ENCOUNTER — Ambulatory Visit
Admission: RE | Admit: 2016-03-04 | Discharge: 2016-03-04 | Disposition: A | Discharge status: Home / Self Care | Payer: BLUE CROSS/BLUE SHIELD | Source: Ambulatory Visit | Attending: Obstetrics and Gynecology | Admitting: Obstetrics and Gynecology

## 2016-03-04 DIAGNOSIS — Z1231 Encounter for screening mammogram for malignant neoplasm of breast: Secondary | ICD-10-CM | POA: Diagnosis not present

## 2016-06-15 DIAGNOSIS — M7711 Lateral epicondylitis, right elbow: Secondary | ICD-10-CM | POA: Diagnosis not present

## 2016-06-15 DIAGNOSIS — M7541 Impingement syndrome of right shoulder: Secondary | ICD-10-CM | POA: Diagnosis not present

## 2016-07-12 DIAGNOSIS — M7711 Lateral epicondylitis, right elbow: Secondary | ICD-10-CM | POA: Diagnosis not present

## 2016-07-12 DIAGNOSIS — M25511 Pain in right shoulder: Secondary | ICD-10-CM | POA: Diagnosis not present

## 2016-07-19 DIAGNOSIS — M7711 Lateral epicondylitis, right elbow: Secondary | ICD-10-CM | POA: Diagnosis not present

## 2016-07-19 DIAGNOSIS — M7541 Impingement syndrome of right shoulder: Secondary | ICD-10-CM | POA: Diagnosis not present

## 2016-12-07 DIAGNOSIS — Z01419 Encounter for gynecological examination (general) (routine) without abnormal findings: Secondary | ICD-10-CM | POA: Diagnosis not present

## 2016-12-07 DIAGNOSIS — Z3041 Encounter for surveillance of contraceptive pills: Secondary | ICD-10-CM | POA: Diagnosis not present

## 2017-01-10 ENCOUNTER — Encounter: Payer: Self-pay | Admitting: Internal Medicine

## 2017-01-10 ENCOUNTER — Ambulatory Visit: Payer: Self-pay

## 2017-01-10 ENCOUNTER — Ambulatory Visit (INDEPENDENT_AMBULATORY_CARE_PROVIDER_SITE_OTHER)
Admission: RE | Admit: 2017-01-10 | Discharge: 2017-01-10 | Disposition: A | Discharge status: Home / Self Care | Payer: BLUE CROSS/BLUE SHIELD | Source: Ambulatory Visit | Attending: Internal Medicine | Admitting: Internal Medicine

## 2017-01-10 ENCOUNTER — Encounter: Payer: Self-pay | Admitting: Family Medicine

## 2017-01-10 ENCOUNTER — Ambulatory Visit: Payer: BLUE CROSS/BLUE SHIELD | Admitting: Internal Medicine

## 2017-01-10 VITALS — BP 120/78 | HR 77 | Temp 98.1°F | Wt 172.0 lb

## 2017-01-10 DIAGNOSIS — M546 Pain in thoracic spine: Secondary | ICD-10-CM | POA: Diagnosis not present

## 2017-01-10 DIAGNOSIS — M549 Dorsalgia, unspecified: Secondary | ICD-10-CM | POA: Diagnosis not present

## 2017-01-10 DIAGNOSIS — S299XXA Unspecified injury of thorax, initial encounter: Secondary | ICD-10-CM | POA: Diagnosis not present

## 2017-01-10 MED ORDER — METHOCARBAMOL 500 MG PO TABS: 500.0000 mg | ORAL_TABLET | Freq: Three times a day (TID) | ORAL | 0 refills | Status: DC | PRN

## 2017-01-10 NOTE — Patient Instructions (Signed)
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
## 2017-01-11 ENCOUNTER — Telehealth: Payer: Self-pay | Admitting: Internal Medicine

## 2017-01-11 ENCOUNTER — Ambulatory Visit: Payer: BLUE CROSS/BLUE SHIELD | Admitting: Family Medicine
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
## 2017-01-13 ENCOUNTER — Encounter: Payer: Self-pay | Admitting: Internal Medicine
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
## 2017-01-31 ENCOUNTER — Ambulatory Visit (INDEPENDENT_AMBULATORY_CARE_PROVIDER_SITE_OTHER): Payer: BLUE CROSS/BLUE SHIELD | Admitting: Family Medicine

## 2017-01-31 ENCOUNTER — Encounter: Payer: Self-pay | Admitting: Family Medicine

## 2017-01-31 ENCOUNTER — Other Ambulatory Visit: Payer: Self-pay | Admitting: Family Medicine

## 2017-01-31 ENCOUNTER — Other Ambulatory Visit (INDEPENDENT_AMBULATORY_CARE_PROVIDER_SITE_OTHER): Payer: BLUE CROSS/BLUE SHIELD

## 2017-01-31 VITALS — BP 120/74 | HR 74 | Temp 98.4°F | Ht 65.5 in | Wt 169.0 lb

## 2017-01-31 DIAGNOSIS — Z23 Encounter for immunization: Secondary | ICD-10-CM

## 2017-01-31 DIAGNOSIS — M542 Cervicalgia: Secondary | ICD-10-CM | POA: Diagnosis not present

## 2017-01-31 DIAGNOSIS — E781 Pure hyperglyceridemia: Secondary | ICD-10-CM

## 2017-01-31 DIAGNOSIS — Z0000 Encounter for general adult medical examination without abnormal findings: Secondary | ICD-10-CM

## 2017-01-31 LAB — COMPREHENSIVE METABOLIC PANEL
ALT: 20 U/L (ref 0–35)
AST: 17 U/L (ref 0–37)
Albumin: 4.1 g/dL (ref 3.5–5.2)
Alkaline Phosphatase: 80 U/L (ref 39–117)
BUN: 13 mg/dL (ref 6–23)
CO2: 27 mEq/L (ref 19–32)
Calcium: 9.3 mg/dL (ref 8.4–10.5)
Chloride: 103 mEq/L (ref 96–112)
Creatinine, Ser: 0.7 mg/dL (ref 0.40–1.20)
GFR: 95.34 mL/min (ref >60.00)
Glucose, Bld: 92 mg/dL (ref 70–99)
Potassium: 4.6 mEq/L (ref 3.5–5.1)
Sodium: 137 mEq/L (ref 135–145)
Total Bilirubin: 0.8 mg/dL (ref 0.2–1.2)
Total Protein: 7.1 g/dL (ref 6.0–8.3)

## 2017-01-31 LAB — LIPID PANEL
Cholesterol: 186 mg/dL (ref 0–200)
HDL: 64.1 mg/dL (ref >39.00)
LDL Cholesterol: 96 mg/dL (ref 0–99)
NonHDL: 121.74
Total CHOL/HDL Ratio: 3
Triglycerides: 128 mg/dL (ref 0.0–149.0)
VLDL: 25.6 mg/dL (ref 0.0–40.0)
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
## 2017-01-31 NOTE — Patient Instructions (Addendum)
Tdap today Use heating pad to neck, as well as stretching exercises provided today. May continue advil and robaxin as needed.  Continue healthy diet and exercise. Good to see you today, call us with questions. Return as needed or in 1 year for next physical  Health Maintenance, Female Adopting a healthy lifestyle and getting preventive care can go a long way to promote health and wellness. Talk with your health care provider about what schedule of regular examinations is right for you. This is a good chance for you to check in with your provider about disease prevention and staying healthy. In between checkups, there are plenty of things you can do on your own. Experts have done a lot of research about which lifestyle changes and preventive measures are most likely to keep you healthy. Ask your health care provider for more information. Weight and diet Eat a healthy diet  Be sure to include plenty of vegetables, fruits, low-fat dairy products, and lean protein.  Do not eat a lot of foods high in solid fats, added sugars, or salt.  Get regular exercise. This is one of the most important things you can do for your health. ? Most adults should exercise for at least 150 minutes each week. The exercise should increase your heart rate and make you sweat (moderate-intensity exercise). ? Most adults should also do strengthening exercises at least twice a week. This is in addition to the moderate-intensity exercise.  Maintain a healthy weight  Body mass index (BMI) is a measurement that can be used to identify possible weight problems. It estimates body fat based on height and weight. Your health care provider can help determine your BMI and help you achieve or maintain a healthy weight.  For females 5 years of age and older: ? A BMI below 18.5 is considered underweight. ? A BMI of 18.5 to 24.9 is normal. ? A BMI of 25 to 29.9 is considered overweight. ? A BMI of 30 and above is considered  obese.  Watch levels of cholesterol and blood lipids  You should start having your blood tested for lipids and cholesterol at 47 years of age, then have this test every 5 years.  You may need to have your cholesterol levels checked more often if: ? Your lipid or cholesterol levels are high. ? You are older than 47 years of age. ? You are at high risk for heart disease.  Cancer screening Lung Cancer  Lung cancer screening is recommended for adults 35-39 years old who are at high risk for lung cancer because of a history of smoking.  A yearly low-dose CT scan of the lungs is recommended for people who: ? Currently smoke. ? Have quit within the past 15 years. ? Have at least a 30-pack-year history of smoking. A pack year is smoking an average of one pack of cigarettes a day for 1 year.  Yearly screening should continue until it has been 15 years since you quit.  Yearly screening should stop if you develop a health problem that would prevent you from having lung cancer treatment.  Breast Cancer  Practice breast self-awareness. This means understanding how your breasts normally appear and feel.  It also means doing regular breast self-exams. Let your health care provider know about any changes, no matter how small.  If you are in your 20s or 30s, you should have a clinical breast exam (CBE) by a health care provider every 1-3 years as part of a regular health exam.  If you are 40 or older, have a CBE every year. Also consider having a breast X-ray (mammogram) every year.  If you have a family history of breast cancer, talk to your health care provider about genetic screening.  If you are at high risk for breast cancer, talk to your health care provider about having an MRI and a mammogram every year.  Breast cancer gene (BRCA) assessment is recommended for women who have family members with BRCA-related cancers. BRCA-related cancers  include: ? Breast. ? Ovarian. ? Tubal. ? Peritoneal cancers.  Results of the assessment will determine the need for genetic counseling and BRCA1 and BRCA2 testing.  Cervical Cancer Your health care provider may recommend that you be screened regularly for cancer of the pelvic organs (ovaries, uterus, and vagina). This screening involves a pelvic examination, including checking for microscopic changes to the surface of your cervix (Pap test). You may be encouraged to have this screening done every 3 years, beginning at age 21.  For women ages 30-65, health care providers may recommend pelvic exams and Pap testing every 3 years, or they may recommend the Pap and pelvic exam, combined with testing for human papilloma virus (HPV), every 5 years. Some types of HPV increase your risk of cervical cancer. Testing for HPV may also be done on women of any age with unclear Pap test results.  Other health care providers may not recommend any screening for nonpregnant women who are considered low risk for pelvic cancer and who do not have symptoms. Ask your health care provider if a screening pelvic exam is right for you.  If you have had past treatment for cervical cancer or a condition that could lead to cancer, you need Pap tests and screening for cancer for at least 20 years after your treatment. If Pap tests have been discontinued, your risk factors (such as having a new sexual partner) need to be reassessed to determine if screening should resume. Some women have medical problems that increase the chance of getting cervical cancer. In these cases, your health care provider may recommend more frequent screening and Pap tests.  Colorectal Cancer  This type of cancer can be detected and often prevented.  Routine colorectal cancer screening usually begins at 47 years of age and continues through 47 years of age.  Your health care provider may recommend screening at an earlier age if you have risk factors  for colon cancer.  Your health care provider may also recommend using home test kits to check for hidden blood in the stool.  A small camera at the end of a tube can be used to examine your colon directly (sigmoidoscopy or colonoscopy). This is done to check for the earliest forms of colorectal cancer.  Routine screening usually begins at age 50.  Direct examination of the colon should be repeated every 5-10 years through 47 years of age. However, you may need to be screened more often if early forms of precancerous polyps or small growths are found.  Skin Cancer  Check your skin from head to toe regularly.  Tell your health care provider about any new moles or changes in moles, especially if there is a change in a mole's shape or color.  Also tell your health care provider if you have a mole that is larger than the size of a pencil eraser.  Always use sunscreen. Apply sunscreen liberally and repeatedly throughout the day.  Protect yourself by wearing long sleeves, pants, a wide-brimmed hat, and   sunglasses whenever you are outside.  Heart disease, diabetes, and high blood pressure  High blood pressure causes heart disease and increases the risk of stroke. High blood pressure is more likely to develop in: ? People who have blood pressure in the high end of the normal range (130-139/85-89 mm Hg). ? People who are overweight or obese. ? People who are African American.  If you are 25-70 years of age, have your blood pressure checked every 3-5 years. If you are 86 years of age or older, have your blood pressure checked every year. You should have your blood pressure measured twice-once when you are at a hospital or clinic, and once when you are not at a hospital or clinic. Record the average of the two measurements. To check your blood pressure when you are not at a hospital or clinic, you can use: ? An automated blood pressure machine at a pharmacy. ? A home blood pressure monitor.  If  you are between 34 years and 33 years old, ask your health care provider if you should take aspirin to prevent strokes.  Have regular diabetes screenings. This involves taking a blood sample to check your fasting blood sugar level. ? If you are at a normal weight and have a low risk for diabetes, have this test once every three years after 47 years of age. ? If you are overweight and have a high risk for diabetes, consider being tested at a younger age or more often. Preventing infection Hepatitis B  If you have a higher risk for hepatitis B, you should be screened for this virus. You are considered at high risk for hepatitis B if: ? You were born in a country where hepatitis B is common. Ask your health care provider which countries are considered high risk. ? Your parents were born in a high-risk country, and you have not been immunized against hepatitis B (hepatitis B vaccine). ? You have HIV or AIDS. ? You use needles to inject street drugs. ? You live with someone who has hepatitis B. ? You have had sex with someone who has hepatitis B. ? You get hemodialysis treatment. ? You take certain medicines for conditions, including cancer, organ transplantation, and autoimmune conditions.  Hepatitis C  Blood testing is recommended for: ? Everyone born from [birth year] through 1966. ? Anyone with known risk factors for hepatitis C.  Sexually transmitted infections (STIs)  You should be screened for sexually transmitted infections (STIs) including gonorrhea and chlamydia if: ? You are sexually active and are younger than 47 years of age. ? You are older than 47 years of age and your health care provider tells you that you are at risk for this type of infection. ? Your sexual activity has changed since you were last screened and you are at an increased risk for chlamydia or gonorrhea. Ask your health care provider if you are at risk.  If you do not have HIV, but are at risk, it may be recommended  that you take a prescription medicine daily to prevent HIV infection. This is called pre-exposure prophylaxis (PrEP). You are considered at risk if: ? You are sexually active and do not regularly use condoms or know the HIV status of your partner(s). ? You take drugs by injection. ? You are sexually active with a partner who has HIV.  Talk with your health care provider about whether you are at high risk of being infected with HIV. If you choose to begin PrEP, you  should first be tested for HIV. You should then be tested every 3 months for as long as you are taking PrEP. Pregnancy  If you are premenopausal and you may become pregnant, ask your health care provider about preconception counseling.  If you may become pregnant, take 400 to 800 micrograms (mcg) of folic acid every day.  If you want to prevent pregnancy, talk to your health care provider about birth control (contraception). Osteoporosis and menopause  Osteoporosis is a disease in which the bones lose minerals and strength with aging. This can result in serious bone fractures. Your risk for osteoporosis can be identified using a bone density scan.  If you are 65 years of age or older, or if you are at risk for osteoporosis and fractures, ask your health care provider if you should be screened.  Ask your health care provider whether you should take a calcium or vitamin D supplement to lower your risk for osteoporosis.  Menopause may have certain physical symptoms and risks.  Hormone replacement therapy may reduce some of these symptoms and risks. Talk to your health care provider about whether hormone replacement therapy is right for you. Follow these instructions at home:  Schedule regular health, dental, and eye exams.  Stay current with your immunizations.  Do not use any tobacco products including cigarettes, chewing tobacco, or electronic cigarettes.  If you are pregnant, do not drink alcohol.  If you are  breastfeeding, limit how much and how often you drink alcohol.  Limit alcohol intake to no more than 1 drink per day for nonpregnant women. One drink equals 12 ounces of beer, 5 ounces of wine, or 1 ounces of hard liquor.  Do not use street drugs.  Do not share needles.  Ask your health care provider for help if you need support or information about quitting drugs.  Tell your health care provider if you often feel depressed.  Tell your health care provider if you have ever been abused or do not feel safe at home. This information is not intended to replace advice given to you by your health care provider. Make sure you discuss any questions you have with your health care provider. Document Released: 09/06/2010 Document Revised: 07/30/2015 Document Reviewed: 11/25/2014 Elsevier Interactive Patient Education  2018 [employer]  

## 2017-01-31 NOTE — Addendum Note (Signed)
Addended by: Nanci PinaGOINS, Kailly Richoux on: 01/31/2017 04:30 PM   Modules accepted: Orders

## 2017-01-31 NOTE — Assessment & Plan Note (Signed)
Improved readings this year.

## 2017-01-31 NOTE — Assessment & Plan Note (Signed)
Preventative protocols reviewed and updated unless pt declined. Discussed healthy diet and lifestyle.  

## 2017-04-14 ENCOUNTER — Other Ambulatory Visit: Payer: Self-pay | Admitting: Obstetrics and Gynecology

## 2017-04-14 DIAGNOSIS — Z1231 Encounter for screening mammogram for malignant neoplasm of breast: Secondary | ICD-10-CM

## 2017-05-08 ENCOUNTER — Ambulatory Visit
Admission: RE | Admit: 2017-05-08 | Discharge: 2017-05-08 | Disposition: A | Discharge status: Home / Self Care | Payer: BLUE CROSS/BLUE SHIELD | Source: Ambulatory Visit | Attending: Obstetrics and Gynecology | Admitting: Obstetrics and Gynecology

## 2017-05-08 DIAGNOSIS — Z1231 Encounter for screening mammogram for malignant neoplasm of breast: Secondary | ICD-10-CM | POA: Diagnosis not present

## 2017-05-18 ENCOUNTER — Encounter: Payer: Self-pay | Admitting: *Deleted

## 2017-05-18 ENCOUNTER — Encounter: Payer: Self-pay | Admitting: Family Medicine

## 2017-05-18 ENCOUNTER — Ambulatory Visit: Payer: BLUE CROSS/BLUE SHIELD | Admitting: Family Medicine

## 2017-05-18 ENCOUNTER — Ambulatory Visit (INDEPENDENT_AMBULATORY_CARE_PROVIDER_SITE_OTHER)
Admission: RE | Admit: 2017-05-18 | Discharge: 2017-05-18 | Disposition: A | Discharge status: Home / Self Care | Payer: BLUE CROSS/BLUE SHIELD | Source: Ambulatory Visit | Attending: Family Medicine | Admitting: Family Medicine

## 2017-05-18 ENCOUNTER — Other Ambulatory Visit: Payer: Self-pay

## 2017-05-18 VITALS — BP 120/84 | HR 65 | Temp 98.3°F | Ht 65.5 in | Wt 169.5 lb

## 2017-05-18 DIAGNOSIS — S6991XA Unspecified injury of right wrist, hand and finger(s), initial encounter: Secondary | ICD-10-CM

## 2017-05-18 DIAGNOSIS — S62316A Displaced fracture of base of fifth metacarpal bone, right hand, initial encounter for closed fracture: Secondary | ICD-10-CM | POA: Diagnosis not present

## 2017-05-18 NOTE — Patient Instructions (Signed)

## 2017-05-18 NOTE — Progress Notes (Signed)
Dr. Karleen Hampshire T. Marzelle Rutten, MD, CAQ Sports Medicine Primary Care and Sports Medicine [address] [place], [address] Phone: [phone] Fax: [phone]  05/18/2017  Patient: Tiffany Zamora, MRN: [id number], DOB: August 14, 1970, 47 y.o.  Primary Physician:  Eustaquio Boyden, MD   Chief Complaint  Patient presents with  . Hand Injury    [place] yesterday   Subjective:   Tiffany Zamora is a 47 y.o. very pleasant female patient who presents with the following:  R 4th metacarpal fracture.  Very pleasant lady who presents after falling on her outstretched hand in a creek yesterday, and now she has pain primarily at the fourth metacarpal.  On the right.  She is right-hand dominant.  She is otherwise a relatively healthy lady.  She has been able to use her hand today, has full sensation as well as extension and flexion at all joints.  Past Medical History, Surgical History, Social History, Family History, Problem List, Medications, and Allergies have been reviewed and updated if relevant.  Patient Active Problem List   Diagnosis Date Noted  . Cervical pain (neck) 01/31/2017  . IBS (irritable bowel syndrome) 09/25/2015  . Hypertriglyceridemia 09/25/2015  . Health maintenance examination 01/20/2014  . Benign hematuria     Past Medical History:  Diagnosis Date  . Benign hematuria ~2005   per pt normal workup with cystoscopy [facility])  . Heartburn   . Seasonal allergies   . Urine incontinence     Past Surgical History:  Procedure Laterality Date  . COLONOSCOPY  2000   for IBS sxs, WNL per patient  . DILATION AND CURETTAGE OF UTERUS  08/2007  . WISDOM TOOTH EXTRACTION  1993    Social History   Socioeconomic History  . Marital status: Married    Spouse name: Not on file  . Number of children: Not on file  . Years of education: Not on file  . Highest education level: Not on file  Social Needs  . Financial resource strain: Not on file  . Food insecurity - worry: Not on  file  . Food insecurity - inability: Not on file  . Transportation needs - medical: Not on file  . Transportation needs - non-medical: Not on file  Occupational History  . Not on file  Tobacco Use  . Smoking status: Never Smoker  . Smokeless tobacco: Never Used  Substance and Sexual Activity  . Alcohol use: Yes    Alcohol/week: 0.0 oz    Comment: wine weekly  . Drug use: No  . Sexual activity: Not on file  Other Topics Concern  . Not on file  Social History Narrative   Wife of Fayrene Fearing "Alycia Rossetti" [place] and daughter and dog   Occupation: stay at home mom   Edu: master's in criminology   Activity: exercises regularly 4-5x/wk   Diet: good water, vegetables daily    Family History  Problem Relation Age of Onset  . Diabetes Sister   . CAD Maternal Grandfather 75       MI  . Hypertension Mother   . Hypertension Father   . Dementia Maternal Grandmother 95  . Cancer Neg Hx   . Stroke Neg Hx     No Known Allergies  Medication list reviewed and updated in full in [place] [facility].  GEN: No fevers, chills. Nontoxic. Primarily MSK c/o today. MSK: Detailed in the HPI GI: tolerating PO intake without difficulty Neuro: No numbness, parasthesias, or tingling associated. Otherwise the pertinent positives of the  ROS are noted above.   Objective:   BP 120/84   Pulse 65   Temp 98.3 F (36.8 C) (Oral)   Ht 5' 5.5" (1.664 m)   Wt 169 lb 8 oz (76.9 kg)   LMP 05/17/2017   BMI 27.78 kg/m    GEN: WDWN, NAD, Non-toxic, Alert & Oriented x 3 HEENT: Atraumatic, Normocephalic.  Ears and Nose: No external deformity. EXTR: No clubbing/cyanosis/edema NEURO: Normal gait.  PSYCH: Normally interactive. Conversant. Not depressed or anxious appearing.  Calm demeanor.    Patient is nontender throughout all phalanges.  They move without any difficulty.  At the fourth metacarpal distally, there is significant swelling, bruising.  There is tenderness to palpation in this  region.  Radiology:  Assessment and Plan:   Hand injury, right, initial encounter - Plan: DG Hand Complete Right, Ambulatory referral to Orthopedic Surgery  The radiological images were independently reviewed by myself in the office and results were reviewed with the patient. My independent interpretation of images: 3 views of the hand, right, there is a fracture at the right metacarpal head laterally.  There is questionable lucency through the body of the metacarpal head, which may represent fracture extension. Electronically Signed  By: Hannah BeatSpencer Breylon Sherrow, MD On: 05/18/2017 4:01 PM   I stabilized the patient in an ulnar gutter splint, fiberglass on the right with 2 additional Ace wraps for support.  I meant to have her see Dr. Megan MansKracinski, her orthopedic surgeon in follow-up.  Follow-up: No Follow-up on file.  Orders Placed This Encounter  Procedures  . DG Hand Complete Right  . Ambulatory referral to Orthopedic Surgery    Signed,  Karleen HampshireSpencer T. Chandlar Staebell, MD   Allergies as of 05/18/2017   No Known Allergies     Medication List        Accurate as of 05/18/17  4:00 PM. Always use your most recent med list.          fluticasone 50 MCG/ACT nasal spray Commonly known as:  FLONASE Place 2 sprays into both nostrils daily.   loratadine 10 MG tablet Commonly known as:  CLARITIN Take 1 tablet (10 mg total) by mouth daily as needed for allergies.   LUTERA 0.1-20 MG-MCG tablet Generic drug:  levonorgestrel-ethinyl estradiol Take 1 tablet by mouth daily.   methocarbamol 500 MG tablet Commonly known as:  ROBAXIN Take 1 tablet (500 mg total) every 8 (eight) hours as needed by mouth for muscle spasms.

## 2017-05-19 DIAGNOSIS — S62364A Nondisplaced fracture of neck of fourth metacarpal bone, right hand, initial encounter for closed fracture: Secondary | ICD-10-CM | POA: Diagnosis not present

## 2017-05-25 DIAGNOSIS — S62364D Nondisplaced fracture of neck of fourth metacarpal bone, right hand, subsequent encounter for fracture with routine healing: Secondary | ICD-10-CM | POA: Diagnosis not present

## 2017-05-25 DIAGNOSIS — S62309A Unspecified fracture of unspecified metacarpal bone, initial encounter for closed fracture: Secondary | ICD-10-CM | POA: Insufficient documentation

## 2017-06-08 DIAGNOSIS — S62364D Nondisplaced fracture of neck of fourth metacarpal bone, right hand, subsequent encounter for fracture with routine healing: Secondary | ICD-10-CM | POA: Diagnosis not present

## 2017-09-20 ENCOUNTER — Ambulatory Visit: Payer: Self-pay | Admitting: *Deleted

## 2017-09-20 ENCOUNTER — Ambulatory Visit: Payer: BLUE CROSS/BLUE SHIELD | Admitting: Family Medicine

## 2017-09-20 ENCOUNTER — Ambulatory Visit (INDEPENDENT_AMBULATORY_CARE_PROVIDER_SITE_OTHER)
Admission: RE | Admit: 2017-09-20 | Discharge: 2017-09-20 | Disposition: A | Discharge status: Home / Self Care | Payer: BLUE CROSS/BLUE SHIELD | Source: Ambulatory Visit | Attending: Family Medicine | Admitting: Family Medicine

## 2017-09-20 ENCOUNTER — Encounter: Payer: Self-pay | Admitting: Family Medicine

## 2017-09-20 VITALS — BP 126/80 | HR 65 | Temp 98.4°F | Ht 65.5 in | Wt 168.2 lb

## 2017-09-20 DIAGNOSIS — R0609 Other forms of dyspnea: Secondary | ICD-10-CM | POA: Insufficient documentation

## 2017-09-20 DIAGNOSIS — R0600 Dyspnea, unspecified: Secondary | ICD-10-CM | POA: Diagnosis not present

## 2017-09-20 DIAGNOSIS — R0602 Shortness of breath: Secondary | ICD-10-CM | POA: Diagnosis not present

## 2017-09-20 MED ORDER — HYDROXYZINE HCL 25 MG PO TABS: 12.5000 mg | ORAL_TABLET | Freq: Two times a day (BID) | ORAL | 0 refills | Status: DC | PRN

## 2017-09-20 NOTE — Patient Instructions (Signed)
EKG, chest xray and labs today.  Trial hydroxyzine as needed for shortness of breath. Continue regular activity/exercise.  Return as needed or in 1 month for follow up visit.

## 2017-09-20 NOTE — Telephone Encounter (Signed)
Patient is calling to discuss her breathing- she states she has had long standing problems with her breathing that come and go. She states she does not have asthma or any other diagnosed lung issue. She states she can be fine and then out of no where feel like she can't catch her breath- she feels this may be stress/anxiety related. She would like to discuss this with her PCP because it is becoming very worrisome to her. Appointment made to discuss options for evaluation and treatment. Reason for Disposition . [1] MILD longstanding difficulty breathing AND [2]  SAME as normal  Answer Assessment - Initial Assessment Questions 1. RESPIRATORY STATUS: "Describe your breathing?" (e.g., wheezing, shortness of breath, unable to speak, severe coughing)      Patient will be breathing normal- then she has a catch when she goes to breath- something is stopping the breath in 2. ONSET: "When did this breathing problem begin?"      Off and on for 6-7 years 3. PATTERN "Does the difficult breathing come and go, or has it been constant since it started?"      No pattern- or trigger 4. SEVERITY: "How bad is your breathing?" (e.g., mild, moderate, severe)    - MILD: No SOB at rest, mild SOB with walking, speaks normally in sentences, can lay down, no retractions, pulse < 100.    - MODERATE: SOB at rest, SOB with minimal exertion and prefers to sit, cannot lie down flat, speaks in phrases, mild retractions, audible wheezing, pulse 100-120.    - SEVERE: Very SOB at rest, speaks in single words, struggling to breathe, sitting hunched forward, retractions, pulse > 120      Hard to pinpoint- comes and goes for 1 week period 5. RECURRENT SYMPTOM: "Have you had difficulty breathing before?" If so, ask: "When was the last time?" and "What happened that time?"      Patient has never been diagnosed with problem 6. CARDIAC HISTORY: "Do you have any history of heart disease?" (e.g., heart attack, angina, bypass surgery,  angioplasty)      no 7. LUNG HISTORY: "Do you have any history of lung disease?"  (e.g., pulmonary embolus, asthma, emphysema)     no 8. CAUSE: "What do you think is causing the breathing problem?"      Possible stress related 9. OTHER SYMPTOMS: "Do you have any other symptoms? (e.g., dizziness, runny nose, cough, chest pain, fever)     no 10. PREGNANCY: "Is there any chance you are pregnant?" "When was your last menstrual period?"       No- LMP- 09/06/17 11. TRAVEL: "Have you traveled out of the country in the last month?" (e.g., travel history, exposures)       n/a  Protocols used: BREATHING DIFFICULTY-A-AH

## 2017-09-20 NOTE — Progress Notes (Signed)
BP 126/80 (BP Location: Left Arm, Patient Position: Sitting, Cuff Size: Normal)   Pulse 65   Temp 98.4 F (36.9 C) (Oral)   Ht 5' 5.5" (1.664 m)   Wt 168 lb 4 oz (76.3 kg)   LMP 09/06/2017   SpO2 99%   BMI 27.57 kg/m    CC: dyspnea Subjective:    Patient ID: Tiffany Zamora, female    DOB: 04/17/1970, 47 y.o.   MRN: [id number]  HPI: Tiffany BeneKelly Meloni is a 47 y.o. female presenting on 09/20/2017 for Breathing Issues (Has issues trying to get a deep breath. Has this issue off and on for yrs. Says she is fine moving around but sometimes when sitting still or lying in bed is when it occurs. Thinks it could be anxiety-related. )   Longstanding issue for years. At times has trouble taking deep breath . Sometimes associated with chest heaviness. Symptoms not present with exertion or when she's busy. Notes mostly when resting or trying to sleep.   Some stress at home.   No recent prolonged immobility. No leg swelling. No family or personal history of blood clots.  She is regular with OCP.  LMP 09/06/2017.   No fevers/chills, cough, wheezing or chest pain.  No h/o asthma. Second hand smoke growing up.  No personal history of smoking.   She has had several friends of same age with health problems (cancer, heart attacks).   Relevant past medical, surgical, family and social history reviewed and updated as indicated. Interim medical history since our last visit reviewed. Allergies and medications reviewed and updated. Outpatient Medications Prior to Visit  Medication Sig Dispense Refill  . levonorgestrel-ethinyl estradiol (LUTERA) 0.1-20 MG-MCG tablet Take 1 tablet by mouth daily.    . fluticasone (FLONASE) 50 MCG/ACT nasal spray Place 2 sprays into both nostrils daily. 16 g 3  . loratadine (CLARITIN) 10 MG tablet Take 1 tablet (10 mg total) by mouth daily as needed for allergies.    . methocarbamol (ROBAXIN) 500 MG tablet Take 1 tablet (500 mg total) every 8 (eight) hours as needed by mouth for  muscle spasms. 20 tablet 0   No facility-administered medications prior to visit.      Per HPI unless specifically indicated in ROS section below Review of Systems     Objective:    BP 126/80 (BP Location: Left Arm, Patient Position: Sitting, Cuff Size: Normal)   Pulse 65   Temp 98.4 F (36.9 C) (Oral)   Ht 5' 5.5" (1.664 m)   Wt 168 lb 4 oz (76.3 kg)   LMP 09/06/2017   SpO2 99%   BMI 27.57 kg/m   Wt Readings from Last 3 Encounters:  09/20/17 168 lb 4 oz (76.3 kg)  05/18/17 169 lb 8 oz (76.9 kg)  01/31/17 169 lb (76.7 kg)    Physical Exam  Constitutional: She appears well-developed and well-nourished. No distress.  HENT:  Mouth/Throat: Oropharynx is clear and moist. No oropharyngeal exudate.  Cardiovascular: Normal rate, regular rhythm and normal heart sounds.  No murmur heard. Pulmonary/Chest: Effort normal and breath sounds normal. No respiratory distress. She has no wheezes. She has no rales.  Musculoskeletal: She exhibits no edema.  Skin: No rash noted.  Psychiatric: Her speech is normal and behavior is normal. Thought content normal. Her mood appears anxious.  Tearful with discussion of stressors and recent friend's deaths  Nursing note and vitals reviewed.     Assessment & Plan:   Problem List Items Addressed This Visit  Shortness of breath - Primary    Longstanding episodes of dyspnea even at rest.  Recommend eval for organic cause. She is on OCP - will check D dimer eval for ?chronic PE. Check EKG and CXR today. Check TSH, CBC as well.  Discussed possible anxiety contribution, reviewed healthy stress relieving strategies. Rx hydroxyzine PRN episodes to see if any benefit.  Rec f/u in 1 month for re evaluation, sooner if needed.      Relevant Orders   DG Chest 2 View   CBC with Differential/Platelet   TSH   D-dimer, quantitative (not at [facility])   EKG 12-Lead       Meds ordered this encounter  Medications  . hydrOXYzine (ATARAX/VISTARIL) 25 MG tablet     Sig: Take 0.5-1 tablets (12.5-25 mg total) by mouth 2 (two) times daily as needed (stress, shortness of breath).    Dispense:  30 tablet    Refill:  0   Orders Placed This Encounter  Procedures  . DG Chest 2 View    Standing Status:   Future    Number of Occurrences:   1    Standing Expiration Date:   11/22/2018    Order Specific Question:   Reason for Exam (SYMPTOM  OR DIAGNOSIS REQUIRED)    Answer:   longstanding intermittent dyspnea, acutely worsening    Order Specific Question:   Is patient pregnant?    Answer:   No    Order Specific Question:   Preferred imaging location?    Answer:   [facility]    Order Specific Question:   Radiology Contrast Protocol - do NOT remove file path    Answer:   [email or web address]  . CBC with Differential/Platelet  . TSH  . D-dimer, quantitative (not at [facility])  . EKG 12-Lead    Follow up plan: Return in about 1 month (around 10/18/2017) for follow up visit.  Eustaquio Boyden, MD

## 2017-09-20 NOTE — Telephone Encounter (Signed)
Spoke to pt who prefers to be seen to day. [facility]t

## 2017-09-20 NOTE — Telephone Encounter (Signed)
Would offer appt today if availability. Thanks.  Otherwise can keep Friday appt.

## 2017-09-20 NOTE — Assessment & Plan Note (Signed)
Longstanding episodes of dyspnea even at rest.  Recommend eval for organic cause. She is on OCP - will check D dimer eval for ?chronic PE. Check EKG and CXR today. Check TSH, CBC as well.  Discussed possible anxiety contribution, reviewed healthy stress relieving strategies. Rx hydroxyzine PRN episodes to see if any benefit.  Rec f/u in 1 month for re evaluation, sooner if needed.

## 2017-09-21 LAB — CBC WITH DIFFERENTIAL/PLATELET
Basophils Absolute: 0.1 10*3/uL (ref 0.0–0.1)
Basophils Relative: 1.1 % (ref 0.0–3.0)
Eosinophils Absolute: 0.2 10*3/uL (ref 0.0–0.7)
Eosinophils Relative: 2.6 % (ref 0.0–5.0)
HCT: 38.8 % (ref 36.0–46.0)
Hemoglobin: 13.5 g/dL (ref 12.0–15.0)
Lymphocytes Relative: 32.2 % (ref 12.0–46.0)
Lymphs Abs: 2.8 10*3/uL (ref 0.7–4.0)
MCHC: 34.9 g/dL (ref 30.0–36.0)
MCV: 94.6 fl (ref 78.0–100.0)
Monocytes Absolute: 0.6 10*3/uL (ref 0.1–1.0)
Monocytes Relative: 7.1 % (ref 3.0–12.0)
Neutro Abs: 4.9 10*3/uL (ref 1.4–7.7)
Neutrophils Relative %: 57 % (ref 43.0–77.0)
Platelets: 294 10*3/uL (ref 150.0–400.0)
RBC: 4.1 Mil/uL (ref 3.87–5.11)
RDW: 12.6 % (ref 11.5–15.5)
WBC: 8.6 10*3/uL (ref 4.0–10.5)

## 2017-09-21 LAB — TSH: TSH: 1.81 u[IU]/mL (ref 0.35–4.50)

## 2017-09-21 LAB — D-DIMER, QUANTITATIVE: D-Dimer, Quant: 0.78 mcg/mL FEU — ABNORMAL HIGH (ref <0.50)

## 2017-09-22 ENCOUNTER — Ambulatory Visit: Payer: BLUE CROSS/BLUE SHIELD | Admitting: Family Medicine

## 2017-09-22 ENCOUNTER — Ambulatory Visit
Admission: RE | Admit: 2017-09-22 | Discharge: 2017-09-22 | Disposition: A | Discharge status: Home / Self Care | Payer: BLUE CROSS/BLUE SHIELD | Source: Ambulatory Visit | Attending: Family Medicine | Admitting: Family Medicine

## 2017-09-22 ENCOUNTER — Encounter: Payer: Self-pay | Admitting: Family Medicine

## 2017-09-22 ENCOUNTER — Other Ambulatory Visit: Payer: Self-pay | Admitting: Family Medicine

## 2017-09-22 DIAGNOSIS — R911 Solitary pulmonary nodule: Secondary | ICD-10-CM | POA: Insufficient documentation

## 2017-09-22 DIAGNOSIS — R0602 Shortness of breath: Secondary | ICD-10-CM | POA: Diagnosis not present

## 2017-09-22 MED ORDER — IOPAMIDOL (ISOVUE-370) INJECTION 76%
75.0000 mL | Freq: Once | INTRAVENOUS | Status: AC | PRN
  Administered 2017-09-22: 75 mL via INTRAVENOUS

## 2017-10-26 ENCOUNTER — Encounter: Payer: Self-pay | Admitting: Family Medicine

## 2017-11-02 ENCOUNTER — Encounter: Payer: Self-pay | Admitting: Family Medicine

## 2017-11-02 ENCOUNTER — Ambulatory Visit: Payer: BLUE CROSS/BLUE SHIELD | Admitting: Family Medicine

## 2017-11-02 VITALS — BP 118/76 | HR 65 | Temp 98.0°F | Ht 65.5 in | Wt 170.0 lb

## 2017-11-02 DIAGNOSIS — I517 Cardiomegaly: Secondary | ICD-10-CM

## 2017-11-02 DIAGNOSIS — F419 Anxiety disorder, unspecified: Secondary | ICD-10-CM

## 2017-11-02 DIAGNOSIS — R0602 Shortness of breath: Secondary | ICD-10-CM | POA: Diagnosis not present

## 2017-11-02 MED ORDER — BUSPIRONE HCL 7.5 MG PO TABS: 7.5000 mg | ORAL_TABLET | Freq: Every day | ORAL | 1 refills | Status: DC

## 2017-11-02 NOTE — Progress Notes (Signed)
BP 118/76 (BP Location: Left Arm, Patient Position: Sitting, Cuff Size: Normal)   Pulse 65   Temp 98 F (36.7 C) (Oral)   Ht 5' 5.5" (1.664 m)   Wt 170 lb (77.1 kg)   LMP 11/01/2017   SpO2 99%   PF 370 L/min Comment: Best of 3 tries  BMI 27.86 kg/m    CC: dyspnea Subjective:    Patient ID: Tiffany Zamora, female    DOB: 31-Mar-1970, 47 y.o.   MRN: [id number]  HPI: Tiffany Zamora is a 47 y.o. female presenting on 11/02/2017 for Shortness of Breath (States she is still having shortness of breath. Thinks it is stress related. )   See prior note for details.  Sensation of dyspnea over last ~6 yrs - breathes out ok, but trouble taking deep breaths in. Associated with stress. Sometimes associated with chest heaviness. Occasional heart fluttering but no racing heart. Symptoms absent with exertion or when she's busy. Mostly noted with rest, when thinking about her breathing, or when trying to sleep. No sense of impending doom. No dizziness.   Workup included elevated D dimer, but CTA was negative for PE, showing borderline cardiomegaly and 5mm LLL pulm nodule (no f/u needed if low risk). CXR was normal. EKG was ok. Labs were otherwise normal (CBC, TSH).   Intermittent heartburn - a few times a week.  Ongoing dyspnea, feels this is getting worse.  Hydroxyzine was not really effective.  Non smoker.  Significant second hand smoke exposure.  No h/o asthma.   Increased stress recently - daughter having a tough time in 7th grade.  Well woman with OBGYN Dr Richardson Dopp on [facility] for over 20 yrs.   Relevant past medical, surgical, family and social history reviewed and updated as indicated. Interim medical history since our last visit reviewed. Allergies and medications reviewed and updated. Outpatient Medications Prior to Visit  Medication Sig Dispense Refill  . hydrOXYzine (ATARAX/VISTARIL) 25 MG tablet Take 0.5-1 tablets (12.5-25 mg total) by mouth 2 (two) times daily as needed (stress, shortness of  breath). 30 tablet 0  . levonorgestrel-ethinyl estradiol (LUTERA) 0.1-20 MG-MCG tablet Take 1 tablet by mouth daily.     No facility-administered medications prior to visit.      Per HPI unless specifically indicated in ROS section below Review of Systems     Objective:    BP 118/76 (BP Location: Left Arm, Patient Position: Sitting, Cuff Size: Normal)   Pulse 65   Temp 98 F (36.7 C) (Oral)   Ht 5' 5.5" (1.664 m)   Wt 170 lb (77.1 kg)   LMP 11/01/2017   SpO2 99%   PF 370 L/min Comment: Best of 3 tries  BMI 27.86 kg/m   Wt Readings from Last 3 Encounters:  11/02/17 170 lb (77.1 kg)  09/20/17 168 lb 4 oz (76.3 kg)  05/18/17 169 lb 8 oz (76.9 kg)    Physical Exam  Constitutional: She appears well-developed and well-nourished. No distress.  HENT:  Mouth/Throat: Oropharynx is clear and moist. No oropharyngeal exudate.  Eyes: Pupils are equal, round, and reactive to light. EOM are normal.  Cardiovascular: Normal rate, regular rhythm and normal heart sounds.  No murmur heard. Pulmonary/Chest: Effort normal and breath sounds normal. No respiratory distress. She has no wheezes. She has no rales.  Musculoskeletal: She exhibits no edema.  Psychiatric: She has a normal mood and affect.  Nursing note and vitals reviewed.  Results for orders placed or performed in visit on 09/20/17  CBC  with Differential/Platelet  Result Value Ref Range   WBC 8.6 4.0 - 10.5 K/uL   RBC 4.10 3.87 - 5.11 Mil/uL   Hemoglobin 13.5 12.0 - 15.0 g/dL   HCT 16.1 09.6 - 04.5 %   MCV 94.6 78.0 - 100.0 fl   MCHC 34.9 30.0 - 36.0 g/dL   RDW 40.9 81.1 - 91.4 %   Platelets 294.0 150.0 - 400.0 K/uL   Neutrophils Relative % 57.0 43.0 - 77.0 %   Lymphocytes Relative 32.2 12.0 - 46.0 %   Monocytes Relative 7.1 3.0 - 12.0 %   Eosinophils Relative 2.6 0.0 - 5.0 %   Basophils Relative 1.1 0.0 - 3.0 %   Neutro Abs 4.9 1.4 - 7.7 K/uL   Lymphs Abs 2.8 0.7 - 4.0 K/uL   Monocytes Absolute 0.6 0.1 - 1.0 K/uL    Eosinophils Absolute 0.2 0.0 - 0.7 K/uL   Basophils Absolute 0.1 0.0 - 0.1 K/uL  TSH  Result Value Ref Range   TSH 1.81 0.35 - 4.50 uIU/mL  D-dimer, quantitative (not at [facility])  Result Value Ref Range   D-Dimer, Quant 0.78 (H) <0.50 mcg/mL FEU   GAD 7 : Generalized Anxiety Score 11/02/2017  Nervous, Anxious, on Edge 1  Control/stop worrying 3  Worry too much - different things 3  Trouble relaxing 2  Restless 0  Easily annoyed or irritable 1  Afraid - awful might happen 0  Total GAD 7 Score 10    Depression screen [facility] 2/9 11/02/2017 01/31/2017  Decreased Interest 0 0  Down, Depressed, Hopeless 0 0  PHQ - 2 Score 0 0  Altered sleeping 3 -  Tired, decreased energy 1 -  Change in appetite 0 -  Feeling bad or failure about yourself  0 -  Trouble concentrating 0 -  Moving slowly or fidgety/restless 1 -  Suicidal thoughts 0 -  PHQ-9 Score 5 -    Peak flow today = 370 Expected peak flow for age and height = 425 ~87% expected    Assessment & Plan:   Problem List Items Addressed This Visit    Shortness of breath - Primary    Ongoing, hydroxyzine was not beneficial. Symptoms not consistent with ischemic cause. CT did raise question of borderline cardiomegaly. Will check echocardiogram to further evaluate this.  PF overall ok today.  Discussed possible anxiety contribution to symptoms. Pt agrees to trial buspar 7.5mg  - discussed side effects to monitor for ie dizziness as well as slow onset of effect of 3-4 wks. Discussed possible referral for counseling pending results of echo.  RTC 1 mo, or update me over mychart in 1 month.      Relevant Orders   ECHOCARDIOGRAM COMPLETE    Other Visit Diagnoses    Cardiomegaly       Relevant Orders   ECHOCARDIOGRAM COMPLETE   Anxiety       Relevant Medications   busPIRone (BUSPAR) 7.5 MG tablet       Meds ordered this encounter  Medications  . busPIRone (BUSPAR) 7.5 MG tablet    Sig: Take 1 tablet (7.5 mg total) by mouth daily.     Dispense:  30 tablet    Refill:  1   Orders Placed This Encounter  Procedures  . ECHOCARDIOGRAM COMPLETE    Standing Status:   Future    Standing Expiration Date:   02/03/2019    Order Specific Question:   Where should this test be performed    Answer:  [facility]    Order Specific Question:   Perflutren DEFINITY (image enhancing agent) should be administered unless hypersensitivity or allergy exist    Answer:   Administer Perflutren    Order Specific Question:   Expected Date:    Answer:   1 week    Follow up plan: No follow-ups on file.  Eustaquio BoydenJavier Margurite Duffy, MD

## 2017-11-02 NOTE — Patient Instructions (Addendum)
Peak flow reassuring today. We will check heart ultrasound to further evaluate possible cardiomegaly. Try buspar 7.5mg  daily.  We will be in touch with ultrasound results and plan  We may discuss referral for counseling.

## 2017-11-02 NOTE — Assessment & Plan Note (Addendum)
Ongoing, hydroxyzine was not beneficial. Symptoms not consistent with ischemic cause. CT did raise question of borderline cardiomegaly. Will check echocardiogram to further evaluate this.  PF overall ok today.  Discussed possible anxiety contribution to symptoms. Pt agrees to trial buspar 7.5mg  - discussed side effects to monitor for ie dizziness as well as slow onset of effect of 3-4 wks. Discussed possible referral for counseling pending results of echo.  RTC 1 mo, or update me over mychart in 1 month.

## 2017-11-15 ENCOUNTER — Ambulatory Visit (INDEPENDENT_AMBULATORY_CARE_PROVIDER_SITE_OTHER): Payer: BLUE CROSS/BLUE SHIELD

## 2017-11-15 ENCOUNTER — Other Ambulatory Visit: Payer: Self-pay

## 2017-11-15 DIAGNOSIS — I517 Cardiomegaly: Secondary | ICD-10-CM | POA: Diagnosis not present

## 2017-11-15 DIAGNOSIS — R0602 Shortness of breath: Secondary | ICD-10-CM

## 2017-11-24 ENCOUNTER — Other Ambulatory Visit: Payer: Self-pay | Admitting: Family Medicine

## 2017-11-24 DIAGNOSIS — F419 Anxiety disorder, unspecified: Secondary | ICD-10-CM

## 2017-11-24 NOTE — Telephone Encounter (Signed)
You just started pt on buspirone. Is it ok for 90-day rx?

## 2018-01-12 DIAGNOSIS — Z01419 Encounter for gynecological examination (general) (routine) without abnormal findings: Secondary | ICD-10-CM | POA: Diagnosis not present

## 2018-01-29 ENCOUNTER — Ambulatory Visit: Payer: BLUE CROSS/BLUE SHIELD | Admitting: Pulmonary Disease

## 2018-01-29 ENCOUNTER — Encounter: Payer: Self-pay | Admitting: Pulmonary Disease

## 2018-01-29 VITALS — BP 122/70 | HR 74 | Resp 16 | Ht 65.5 in | Wt 171.0 lb

## 2018-01-29 DIAGNOSIS — R0609 Other forms of dyspnea: Secondary | ICD-10-CM

## 2018-01-29 MED ORDER — ALBUTEROL SULFATE HFA 108 (90 BASE) MCG/ACT IN AERS: 1.0000 | INHALATION_SPRAY | RESPIRATORY_TRACT | 10 refills | Status: DC | PRN

## 2018-01-29 NOTE — Patient Instructions (Signed)
Trial of albuterol inhaler.  Use 1-2 actuations as needed for increased sensation of shortness of breath.  Pay attention to whether this relieves your symptoms (or possibly even exacerbates them).  Follow-up in 2 to 4 weeks with lung function tests (PFTs) prior to that visit

## 2018-02-03 ENCOUNTER — Other Ambulatory Visit: Payer: Self-pay | Admitting: Family Medicine

## 2018-02-03 DIAGNOSIS — E781 Pure hyperglyceridemia: Secondary | ICD-10-CM

## 2018-02-03 DIAGNOSIS — R0602 Shortness of breath: Secondary | ICD-10-CM

## 2018-02-05 ENCOUNTER — Other Ambulatory Visit (INDEPENDENT_AMBULATORY_CARE_PROVIDER_SITE_OTHER): Payer: BLUE CROSS/BLUE SHIELD

## 2018-02-05 ENCOUNTER — Encounter: Payer: Self-pay | Admitting: Pulmonary Disease

## 2018-02-05 DIAGNOSIS — E781 Pure hyperglyceridemia: Secondary | ICD-10-CM | POA: Diagnosis not present

## 2018-02-05 DIAGNOSIS — R0602 Shortness of breath: Secondary | ICD-10-CM | POA: Diagnosis not present

## 2018-02-05 LAB — LIPID PANEL
Cholesterol: 165 mg/dL (ref 0–200)
HDL: 57.7 mg/dL (ref >39.00)
LDL Cholesterol: 75 mg/dL (ref 0–99)
NonHDL: 106.83
Total CHOL/HDL Ratio: 3
Triglycerides: 158 mg/dL — ABNORMAL HIGH (ref 0.0–149.0)
VLDL: 31.6 mg/dL (ref 0.0–40.0)

## 2018-02-05 LAB — BASIC METABOLIC PANEL
BUN: 12 mg/dL (ref 6–23)
CO2: 25 mEq/L (ref 19–32)
Calcium: 9.1 mg/dL (ref 8.4–10.5)
Chloride: 103 mEq/L (ref 96–112)
Creatinine, Ser: 0.78 mg/dL (ref 0.40–1.20)
GFR: 83.78 mL/min (ref >60.00)
Glucose, Bld: 92 mg/dL (ref 70–99)
Potassium: 3.8 mEq/L (ref 3.5–5.1)
Sodium: 137 mEq/L (ref 135–145)

## 2018-02-05 LAB — BRAIN NATRIURETIC PEPTIDE: Pro B Natriuretic peptide (BNP): 18 pg/mL (ref 0.0–100.0)

## 2018-02-05 NOTE — Progress Notes (Signed)
PULMONARY CONSULT NOTE  Requesting MD/Service: Sharen Hones Date of initial consultation: 01/29/18 Reason for consultation: Dyspnea  PT PROFILE: 47 y.o. female never smoker referred for bouts of dyspnea with associated anxiety described as "cannot get a deep breath".  Symptoms have occurred intermittently over past 5 years.  DATA: 09/22/17 CTA chest: No PE.  No CT findings to explain shortness of breath.  5 mm LLL nodule.  No follow-up needed if patient is low risk. 11/15/17 echocardiogram: Normal LVEF.  Left atrium mildly dilated.  Right atrium upper limits of normal.  RVSP could not be estimated.   INTERVAL:  HPI:  As above.  Patient describes episodic dyspnea ("cannot get a deep breath") over the past 5 years.  During these episodes she feels a sense of anxiety or even panic but is unable to discern whether the anxiety precedes or follows the dyspnea.  She is unable to identify any exacerbating factors other than anxiety.  She is unable to discern any alleviating factors other than "not thinking about it".  Overall, this is gotten substantially worse in the past 4 months.  She reports that others are unaware of her symptoms.  In other words, she does not have an outward appearance of dyspnea.  She exercises on a regular basis and does not notice breathing difficulties during what she describes as vigorous exercise.  She has been evaluated with EKG, echocardiogram, "blood tests", CT chest.  Occasionally, her symptoms are worse in a supine position.  Occasionally, shortness of breath awakens her at night.  She does note occasional heartburn symptoms.  She has no significant occupational or environmental exposures.  She has no exotic pets in the home.  She has no significant travel history.  Past Medical History:  Diagnosis Date  . Benign hematuria ~2005   per pt normal workup with cystoscopy [facility])  . Heartburn   . IBS (irritable bowel syndrome) 09/25/2015  . Seasonal allergies   . Urine  incontinence     Past Surgical History:  Procedure Laterality Date  . COLONOSCOPY  2000   for IBS sxs, WNL per patient  . DILATION AND CURETTAGE OF UTERUS  08/2007  . WISDOM TOOTH EXTRACTION  1993    MEDICATIONS: I have reviewed all medications and confirmed regimen as documented  Social History   Socioeconomic History  . Marital status: Married    Spouse name: Not on file  . Number of children: Not on file  . Years of education: Not on file  . Highest education level: Not on file  Occupational History  . Not on file  Social Needs  . Financial resource strain: Not on file  . Food insecurity:    Worry: Not on file    Inability: Not on file  . Transportation needs:    Medical: Not on file    Non-medical: Not on file  Tobacco Use  . Smoking status: Never Smoker  . Smokeless tobacco: Never Used  Substance and Sexual Activity  . Alcohol use: Yes    Alcohol/week: 0.0 standard drinks    Comment: wine weekly  . Drug use: No  . Sexual activity: Not on file  Lifestyle  . Physical activity:    Days per week: Not on file    Minutes per session: Not on file  . Stress: Not on file  Relationships  . Social connections:    Talks on phone: Not on file    Gets together: Not on file    Attends religious service: Not on  file    Active member of club or organization: Not on file    Attends meetings of clubs or organizations: Not on file    Relationship status: Not on file  . Intimate partner violence:    Fear of current or ex partner: Not on file    Emotionally abused: Not on file    Physically abused: Not on file    Forced sexual activity: Not on file  Other Topics Concern  . Not on file  Social History Narrative   Wife of Fayrene Fearing "Alycia Rossetti" [place] and daughter and dog   Occupation: stay at home mom   Edu: master's in criminology   Activity: exercises regularly 4-5x/wk   Diet: good water, vegetables daily    Family History  Problem Relation Age of Onset  . Diabetes Sister    . CAD Maternal Grandfather 23       MI  . Hypertension Mother   . Hypertension Father   . Dementia Maternal Grandmother 95  . Cancer Neg Hx   . Stroke Neg Hx     ROS: No fever, myalgias/arthralgias, unexplained weight loss or weight gain No new focal weakness or sensory deficits No otalgia, hearing loss, visual changes, nasal and sinus symptoms, mouth and throat problems No neck pain or adenopathy No abdominal pain, N/V/D, diarrhea, change in bowel pattern No dysuria, change in urinary pattern   Vitals:   01/29/18 1440 01/29/18 1452  BP:  122/70  Pulse:  74  Resp: 16   SpO2:  98%  Weight: 171 lb (77.6 kg)   Height: 5' 5.5" (1.664 m)      EXAM:  Gen: NAD HEENT: NCAT, sclera white Neck: No JVD Lungs: breath sounds full, no wheezes or other adventitious sounds Cardiovascular: RRR, no murmurs Abdomen: Soft, nontender, normal BS Ext: without clubbing, cyanosis, edema Neuro: grossly intact Skin: Limited exam, no lesions noted  DATA:   BMP Latest Ref Rng & Units 02/05/2018 01/31/2017 09/21/2015  Glucose 70 - 99 mg/dL 92 92 86  BUN 6 - 23 mg/dL 12 13 12   Creatinine 0.40 - 1.20 mg/dL 1.61 0.96 0.45  Sodium 135 - 145 mEq/L 137 137 139  Potassium 3.5 - 5.1 mEq/L 3.8 4.6 3.6  Chloride 96 - 112 mEq/L 103 103 104  CO2 19 - 32 mEq/L 25 27 28   Calcium 8.4 - 10.5 mg/dL 9.1 9.3 9.3    CBC Latest Ref Rng & Units 09/20/2017  WBC 4.0 - 10.5 K/uL 8.6  Hemoglobin 12.0 - 15.0 g/dL 40.9  Hematocrit 81.1 - 46.0 % 38.8  Platelets 150.0 - 400.0 K/uL 294.0    CXR 07/17: No evidence of acute or chronic pulmonary or cardiac disease  I have personally reviewed all chest radiographs reported above including CXRs and CT chest unless otherwise indicated  IMPRESSION:     ICD-10-CM   1. Episodic dyspnea  R06.09 Pulmonary Function Test [facility] Only   Her episodic dyspnea is unexplained by any evaluation thus far.  Statistically, most likely diagnosis would be asthma.  However, her  description of symptoms is not highly suggestive of this diagnosis.  It is possible that she has very early pulmonary hypertension.  It is also highly likely that anxiety is a cause of or an amplifier of her symptoms.   PLAN:  Trial of albuterol inhaler.  Use 1-2 actuations as needed for increased sensation of shortness of breath.  Pay attention to whether this relieves your symptoms (or possibly even exacerbates them).  Follow-up in  2 to 4 weeks with lung function tests (PFTs) prior to that visit   Billy Fischeravid Simonds, MD PCCM service Mobile [phone] Pager [phone] 02/05/2018 1:02 PM

## 2018-02-06 ENCOUNTER — Ambulatory Visit (INDEPENDENT_AMBULATORY_CARE_PROVIDER_SITE_OTHER): Payer: BLUE CROSS/BLUE SHIELD | Admitting: Family Medicine

## 2018-02-06 ENCOUNTER — Other Ambulatory Visit: Payer: BLUE CROSS/BLUE SHIELD

## 2018-02-06 ENCOUNTER — Encounter: Payer: Self-pay | Admitting: Family Medicine

## 2018-02-06 VITALS — BP 124/80 | HR 65 | Temp 98.6°F | Ht 65.5 in | Wt 171.0 lb

## 2018-02-06 DIAGNOSIS — E781 Pure hyperglyceridemia: Secondary | ICD-10-CM

## 2018-02-06 DIAGNOSIS — R0600 Dyspnea, unspecified: Secondary | ICD-10-CM | POA: Diagnosis not present

## 2018-02-06 DIAGNOSIS — Z0000 Encounter for general adult medical examination without abnormal findings: Secondary | ICD-10-CM

## 2018-02-06 DIAGNOSIS — Z23 Encounter for immunization: Secondary | ICD-10-CM | POA: Diagnosis not present

## 2018-02-06 DIAGNOSIS — R0609 Other forms of dyspnea: Secondary | ICD-10-CM

## 2018-02-06 NOTE — Assessment & Plan Note (Signed)
Minimal - discussed healthy diet choices to control trig levels.

## 2018-02-06 NOTE — Patient Instructions (Addendum)
Flu shot today.  You are doing well today.  We will wait for results of lung function test.  Work on lowering triglycerides through healthy diet choices.  Return as needed or in 1 year for next physical.   Health Maintenance, Female Adopting a healthy lifestyle and getting preventive care can go a long way to promote health and wellness. Talk with your health care provider about what schedule of regular examinations is right for you. This is a good chance for you to check in with your provider about disease prevention and staying healthy. In between checkups, there are plenty of things you can do on your own. Experts have done a lot of research about which lifestyle changes and preventive measures are most likely to keep you healthy. Ask your health care provider for more information. Weight and diet Eat a healthy diet  Be sure to include plenty of vegetables, fruits, low-fat dairy products, and lean protein.  Do not eat a lot of foods high in solid fats, added sugars, or salt.  Get regular exercise. This is one of the most important things you can do for your health. ? Most adults should exercise for at least 150 minutes each week. The exercise should increase your heart rate and make you sweat (moderate-intensity exercise). ? Most adults should also do strengthening exercises at least twice a week. This is in addition to the moderate-intensity exercise.  Maintain a healthy weight  Body mass index (BMI) is a measurement that can be used to identify possible weight problems. It estimates body fat based on height and weight. Your health care provider can help determine your BMI and help you achieve or maintain a healthy weight.  For females 29 years of age and older: ? A BMI below 18.5 is considered underweight. ? A BMI of 18.5 to 24.9 is normal. ? A BMI of 25 to 29.9 is considered overweight. ? A BMI of 30 and above is considered obese.  Watch levels of cholesterol and blood  lipids  You should start having your blood tested for lipids and cholesterol at 48 years of age, then have this test every 5 years.  You may need to have your cholesterol levels checked more often if: ? Your lipid or cholesterol levels are high. ? You are older than 48 years of age. ? You are at high risk for heart disease.  Cancer screening Lung Cancer  Lung cancer screening is recommended for adults 47-56 years old who are at high risk for lung cancer because of a history of smoking.  A yearly low-dose CT scan of the lungs is recommended for people who: ? Currently smoke. ? Have quit within the past 15 years. ? Have at least a 30-pack-year history of smoking. A pack year is smoking an average of one pack of cigarettes a day for 1 year.  Yearly screening should continue until it has been 15 years since you quit.  Yearly screening should stop if you develop a health problem that would prevent you from having lung cancer treatment.  Breast Cancer  Practice breast self-awareness. This means understanding how your breasts normally appear and feel.  It also means doing regular breast self-exams. Let your health care provider know about any changes, no matter how small.  If you are in your 20s or 30s, you should have a clinical breast exam (CBE) by a health care provider every 1-3 years as part of a regular health exam.  If you are 40 or  older, have a CBE every year. Also consider having a breast X-ray (mammogram) every year.  If you have a family history of breast cancer, talk to your health care provider about genetic screening.  If you are at high risk for breast cancer, talk to your health care provider about having an MRI and a mammogram every year.  Breast cancer gene (BRCA) assessment is recommended for women who have family members with BRCA-related cancers. BRCA-related cancers include: ? Breast. ? Ovarian. ? Tubal. ? Peritoneal cancers.  Results of the assessment will  determine the need for genetic counseling and BRCA1 and BRCA2 testing.  Cervical Cancer Your health care provider may recommend that you be screened regularly for cancer of the pelvic organs (ovaries, uterus, and vagina). This screening involves a pelvic examination, including checking for microscopic changes to the surface of your cervix (Pap test). You may be encouraged to have this screening done every 3 years, beginning at age 35.  For women ages 82-65, health care providers may recommend pelvic exams and Pap testing every 3 years, or they may recommend the Pap and pelvic exam, combined with testing for human papilloma virus (HPV), every 5 years. Some types of HPV increase your risk of cervical cancer. Testing for HPV may also be done on women of any age with unclear Pap test results.  Other health care providers may not recommend any screening for nonpregnant women who are considered low risk for pelvic cancer and who do not have symptoms. Ask your health care provider if a screening pelvic exam is right for you.  If you have had past treatment for cervical cancer or a condition that could lead to cancer, you need Pap tests and screening for cancer for at least 20 years after your treatment. If Pap tests have been discontinued, your risk factors (such as having a new sexual partner) need to be reassessed to determine if screening should resume. Some women have medical problems that increase the chance of getting cervical cancer. In these cases, your health care provider may recommend more frequent screening and Pap tests.  Colorectal Cancer  This type of cancer can be detected and often prevented.  Routine colorectal cancer screening usually begins at 48 years of age and continues through 48 years of age.  Your health care provider may recommend screening at an earlier age if you have risk factors for colon cancer.  Your health care provider may also recommend using home test kits to check  for hidden blood in the stool.  A small camera at the end of a tube can be used to examine your colon directly (sigmoidoscopy or colonoscopy). This is done to check for the earliest forms of colorectal cancer.  Routine screening usually begins at age 53.  Direct examination of the colon should be repeated every 5-10 years through 48 years of age. However, you may need to be screened more often if early forms of precancerous polyps or small growths are found.  Skin Cancer  Check your skin from head to toe regularly.  Tell your health care provider about any new moles or changes in moles, especially if there is a change in a mole's shape or color.  Also tell your health care provider if you have a mole that is larger than the size of a pencil eraser.  Always use sunscreen. Apply sunscreen liberally and repeatedly throughout the day.  Protect yourself by wearing long sleeves, pants, a wide-brimmed hat, and sunglasses whenever you are outside.  Heart disease, diabetes, and high blood pressure  High blood pressure causes heart disease and increases the risk of stroke. High blood pressure is more likely to develop in: ? People who have blood pressure in the high end of the normal range (130-139/85-89 mm Hg). ? People who are overweight or obese. ? People who are African American.  If you are 3-21 years of age, have your blood pressure checked every 3-5 years. If you are 28 years of age or older, have your blood pressure checked every year. You should have your blood pressure measured twice-once when you are at a hospital or clinic, and once when you are not at a hospital or clinic. Record the average of the two measurements. To check your blood pressure when you are not at a hospital or clinic, you can use: ? An automated blood pressure machine at a pharmacy. ? A home blood pressure monitor.  If you are between 62 years and 77 years old, ask your health care provider if you should take  aspirin to prevent strokes.  Have regular diabetes screenings. This involves taking a blood sample to check your fasting blood sugar level. ? If you are at a normal weight and have a low risk for diabetes, have this test once every three years after 48 years of age. ? If you are overweight and have a high risk for diabetes, consider being tested at a younger age or more often. Preventing infection Hepatitis B  If you have a higher risk for hepatitis B, you should be screened for this virus. You are considered at high risk for hepatitis B if: ? You were born in a country where hepatitis B is common. Ask your health care provider which countries are considered high risk. ? Your parents were born in a high-risk country, and you have not been immunized against hepatitis B (hepatitis B vaccine). ? You have HIV or AIDS. ? You use needles to inject street drugs. ? You live with someone who has hepatitis B. ? You have had sex with someone who has hepatitis B. ? You get hemodialysis treatment. ? You take certain medicines for conditions, including cancer, organ transplantation, and autoimmune conditions.  Hepatitis C  Blood testing is recommended for: ? Everyone born from [birth year] through 1966. ? Anyone with known risk factors for hepatitis C.  Sexually transmitted infections (STIs)  You should be screened for sexually transmitted infections (STIs) including gonorrhea and chlamydia if: ? You are sexually active and are younger than 48 years of age. ? You are older than 48 years of age and your health care provider tells you that you are at risk for this type of infection. ? Your sexual activity has changed since you were last screened and you are at an increased risk for chlamydia or gonorrhea. Ask your health care provider if you are at risk.  If you do not have HIV, but are at risk, it may be recommended that you take a prescription medicine daily to prevent HIV infection. This is called  pre-exposure prophylaxis (PrEP). You are considered at risk if: ? You are sexually active and do not regularly use condoms or know the HIV status of your partner(s). ? You take drugs by injection. ? You are sexually active with a partner who has HIV.  Talk with your health care provider about whether you are at high risk of being infected with HIV. If you choose to begin PrEP, you should first be tested for HIV.  You should then be tested every 3 months for as long as you are taking PrEP. Pregnancy  If you are premenopausal and you may become pregnant, ask your health care provider about preconception counseling.  If you may become pregnant, take 400 to 800 micrograms (mcg) of folic acid every day.  If you want to prevent pregnancy, talk to your health care provider about birth control (contraception). Osteoporosis and menopause  Osteoporosis is a disease in which the bones lose minerals and strength with aging. This can result in serious bone fractures. Your risk for osteoporosis can be identified using a bone density scan.  If you are 65 years of age or older, or if you are at risk for osteoporosis and fractures, ask your health care provider if you should be screened.  Ask your health care provider whether you should take a calcium or vitamin D supplement to lower your risk for osteoporosis.  Menopause may have certain physical symptoms and risks.  Hormone replacement therapy may reduce some of these symptoms and risks. Talk to your health care provider about whether hormone replacement therapy is right for you. Follow these instructions at home:  Schedule regular health, dental, and eye exams.  Stay current with your immunizations.  Do not use any tobacco products including cigarettes, chewing tobacco, or electronic cigarettes.  If you are pregnant, do not drink alcohol.  If you are breastfeeding, limit how much and how often you drink alcohol.  Limit alcohol intake to no more  than 1 drink per day for nonpregnant women. One drink equals 12 ounces of beer, 5 ounces of wine, or 1 ounces of hard liquor.  Do not use street drugs.  Do not share needles.  Ask your health care provider for help if you need support or information about quitting drugs.  Tell your health care provider if you often feel depressed.  Tell your health care provider if you have ever been abused or do not feel safe at home. This information is not intended to replace advice given to you by your health care provider. Make sure you discuss any questions you have with your health care provider. Document Released: 09/06/2010 Document Revised: 07/30/2015 Document Reviewed: 11/25/2014 [employer] Interactive Patient Education  2018 [employer]  

## 2018-02-06 NOTE — Progress Notes (Signed)
BP 124/80 (BP Location: Left Arm, Patient Position: Sitting, Cuff Size: Normal)   Pulse 65   Temp 98.6 F (37 C) (Oral)   Ht 5' 5.5" (1.664 m)   Wt 171 lb (77.6 kg)   LMP 01/17/2018   SpO2 98%   BMI 28.02 kg/m    CC: CPE Subjective:    Patient ID: Tiffany Zamora, female    DOB: 06-15-1970, 47 y.o.   MRN: [id number]  HPI: Tiffany Zamora is a 47 y.o. female presenting on 02/06/2018 for Annual Exam   Ongoing longstanding episodic dyspnea- saw pulm Dr Sung Amabile, note reviewed. Planned formal PFTs to evaluate for asthma. Also placed on albuterol inhaler PRN. Hydroxyzine and bupropion didn't help.   Preventative: Well woman with OBGYN 01/2018 (Dr Gerald Leitz), normal pap.  Mammogram 05/2017 birads1 Colonoscopy for IBS - done ~2000.  Flu shot - today Td 2006, Tdap 2018  G2P1 LMP - last Wednesday. Regular cycles, light period. On OCP.  Seat belt use discussed Sunscreen use discussed. No changing moles.  Non smoker Alcohol - socially Dentist q4 months Eye exam yearly  Wife of Fayrene Fearing "Alycia Rossetti" Heeg and daughter and dog Occupation: stay at home mom Edu: master's in criminology Activity: exercises regularly 4-5x/wk  Diet: good water, vegetables daily   Relevant past medical, surgical, family and social history reviewed and updated as indicated. Interim medical history since our last visit reviewed. Allergies and medications reviewed and updated. Outpatient Medications Prior to Visit  Medication Sig Dispense Refill  . albuterol (PROVENTIL HFA;VENTOLIN HFA) 108 (90 Base) MCG/ACT inhaler Inhale 1-2 puffs into the lungs every 4 (four) hours as needed for wheezing or shortness of breath. 1 Inhaler 10  . levonorgestrel-ethinyl estradiol (LUTERA) 0.1-20 MG-MCG tablet Take 1 tablet by mouth daily.    . hydrOXYzine (ATARAX/VISTARIL) 25 MG tablet Take 0.5-1 tablets (12.5-25 mg total) by mouth 2 (two) times daily as needed (stress, shortness of breath). 30 tablet 0   No facility-administered  medications prior to visit.      Per HPI unless specifically indicated in ROS section below Review of Systems  Constitutional: Negative for activity change, appetite change, chills, fatigue, fever and unexpected weight change.  HENT: Negative for hearing loss.   Eyes: Negative for visual disturbance.  Respiratory: Negative for cough, chest tightness, shortness of breath and wheezing.   Cardiovascular: Negative for chest pain, palpitations and leg swelling.  Gastrointestinal: Negative for abdominal distention, abdominal pain, blood in stool, constipation, diarrhea, nausea and vomiting.  Genitourinary: Negative for difficulty urinating and hematuria.  Musculoskeletal: Negative for arthralgias, myalgias and neck pain.  Skin: Negative for rash.  Neurological: Negative for dizziness, seizures, syncope and headaches.  Hematological: Negative for adenopathy. Bruises/bleeds easily (mild).  Psychiatric/Behavioral: Negative for dysphoric mood. The patient is not nervous/anxious.        Objective:    BP 124/80 (BP Location: Left Arm, Patient Position: Sitting, Cuff Size: Normal)   Pulse 65   Temp 98.6 F (37 C) (Oral)   Ht 5' 5.5" (1.664 m)   Wt 171 lb (77.6 kg)   LMP 01/17/2018   SpO2 98%   BMI 28.02 kg/m   Wt Readings from Last 3 Encounters:  02/06/18 171 lb (77.6 kg)  01/29/18 171 lb (77.6 kg)  11/02/17 170 lb (77.1 kg)    Physical Exam  Constitutional: She is oriented to person, place, and time. She appears well-developed and well-nourished. No distress.  HENT:  Head: Normocephalic and atraumatic.  Right Ear: Hearing, tympanic membrane, external  ear and ear canal normal.  Left Ear: Hearing, tympanic membrane, external ear and ear canal normal.  Nose: Nose normal.  Mouth/Throat: Uvula is midline, oropharynx is clear and moist and mucous membranes are normal. No oropharyngeal exudate, posterior oropharyngeal edema or posterior oropharyngeal erythema.  Eyes: Pupils are equal,  round, and reactive to light. Conjunctivae and EOM are normal. No scleral icterus.  Neck: Normal range of motion. Neck supple.  Cardiovascular: Normal rate, regular rhythm, normal heart sounds and intact distal pulses.  No murmur heard. Pulses:      Radial pulses are 2+ on the right side, and 2+ on the left side.  Pulmonary/Chest: Effort normal and breath sounds normal. No respiratory distress. She has no wheezes. She has no rales.  Abdominal: Soft. Bowel sounds are normal. She exhibits no distension and no mass. There is no tenderness. There is no rebound and no guarding.  Musculoskeletal: Normal range of motion. She exhibits no edema.  Lymphadenopathy:    She has no cervical adenopathy.  Neurological: She is alert and oriented to person, place, and time.  CN grossly intact, station and gait intact  Skin: Skin is warm and dry. No rash noted.  Psychiatric: She has a normal mood and affect. Her behavior is normal. Judgment and thought content normal.  Nursing note and vitals reviewed.  Results for orders placed or performed in visit on 02/05/18  Brain natriuretic peptide  Result Value Ref Range   Pro B Natriuretic peptide (BNP) 18.0 0.0 - 100.0 pg/mL  Basic metabolic panel  Result Value Ref Range   Sodium 137 135 - 145 mEq/L   Potassium 3.8 3.5 - 5.1 mEq/L   Chloride 103 96 - 112 mEq/L   CO2 25 19 - 32 mEq/L   Glucose, Bld 92 70 - 99 mg/dL   BUN 12 6 - 23 mg/dL   Creatinine, Ser 1.610.78 0.40 - 1.20 mg/dL   Calcium 9.1 8.4 - 09.610.5 mg/dL   GFR 04.5483.78 >09.81>60.00 mL/min  Lipid panel  Result Value Ref Range   Cholesterol 165 0 - 200 mg/dL   Triglycerides 191.4158.0 (H) 0.0 - 149.0 mg/dL   HDL 78.2957.70 >56.21>39.00 mg/dL   VLDL 30.831.6 0.0 - 65.740.0 mg/dL   LDL Cholesterol 75 0 - 99 mg/dL   Total CHOL/HDL Ratio 3    NonHDL 106.83       Assessment & Plan:   Problem List Items Addressed This Visit    Hypertriglyceridemia    Minimal - discussed healthy diet choices to control trig levels.       Health  maintenance examination - Primary    Preventative protocols reviewed and updated unless pt declined. Discussed healthy diet and lifestyle.       Chronic dyspnea    Appreciate pulm care.        Other Visit Diagnoses    Need for influenza vaccination       Relevant Orders   Flu Vaccine QUAD 36+ mos IM (Completed)       No orders of the defined types were placed in this encounter.  Orders Placed This Encounter  Procedures  . Flu Vaccine QUAD 36+ mos IM    Follow up plan: Return in about 1 year (around 02/07/2019) for annual exam, prior fasting for blood work.  Eustaquio BoydenJavier Jonaya Freshour, MD

## 2018-02-06 NOTE — Assessment & Plan Note (Signed)
Appreciate pulm care.  ?

## 2018-02-06 NOTE — Assessment & Plan Note (Signed)
Preventative protocols reviewed and updated unless pt declined. Discussed healthy diet and lifestyle.  

## 2018-02-20 ENCOUNTER — Ambulatory Visit: Payer: BLUE CROSS/BLUE SHIELD | Attending: Pulmonary Disease

## 2018-02-20 DIAGNOSIS — R0609 Other forms of dyspnea: Secondary | ICD-10-CM | POA: Diagnosis not present

## 2018-02-23 ENCOUNTER — Ambulatory Visit: Payer: BLUE CROSS/BLUE SHIELD | Admitting: Pulmonary Disease

## 2018-02-23 ENCOUNTER — Encounter: Payer: Self-pay | Admitting: Pulmonary Disease

## 2018-02-23 VITALS — BP 124/74 | HR 78 | Ht 65.5 in | Wt 170.6 lb

## 2018-02-23 DIAGNOSIS — J4520 Mild intermittent asthma, uncomplicated: Secondary | ICD-10-CM | POA: Diagnosis not present

## 2018-02-23 NOTE — Patient Instructions (Signed)
Continue albuterol as needed for increased shortness of breath, wheezing, chest tightness, cough.  You may also use it prior to exercise if you notice that exercise brings up the symptoms.  Follow-up as needed.  In particular, I want to hear back from you if you are using albuterol for the above symptoms more than 3 or 4 times per week.

## 2018-03-04 NOTE — Progress Notes (Signed)
PULMONARY OFFICE FOLLOW-UP NOTE  Requesting MD/Service: Sharen HonesGutierrez Date of initial consultation: 01/29/18 Reason for consultation: Dyspnea  PT PROFILE: 47 y.o. female never smoker referred for bouts of dyspnea with associated anxiety described as "cannot get a deep breath".  Symptoms have occurred intermittently over past 5 years.  Responded favorably to albuterol inhaler.  Provisional diagnosis: Mild intermittent asthma  DATA: 09/22/17 CTA chest: No PE.  No CT findings to explain shortness of breath.  5 mm LLL nodule.  No follow-up needed if patient is low risk. 11/15/17 echocardiogram: Normal LVEF.  Left atrium mildly dilated.  Right atrium upper limits of normal.  RVSP could not be estimated. 02/20/18 PFTs: Spirometry normal, lung volumes normal, DLCO normal.  Bronchodilator not administered   INTERVAL: No major events  SUBJ:  This is a scheduled follow-up.  Last visit, I prescribed albuterol to be used as needed during episodes of chest tightness and inability to "get a deep breath".  She believes that the albuterol has helped significantly.  She has no new complaints.  She is presently using albuterol 2-3 times per week.  She denies CP, fever, purulent sputum, hemoptysis, LE edema and calf tenderness.   Vitals:   02/23/18 1051 02/23/18 1055  BP:  124/74  Pulse:  78  SpO2:  98%  Weight: 170 lb 9.6 oz (77.4 kg)   Height: 5' 5.5" (1.664 m)      EXAM:  Gen: NAD HEENT: NCAT, sclera white Neck: No JVD Lungs: breath sounds full, no wheezes or other adventitious sounds Cardiovascular: RRR, no murmurs Abdomen: Soft, nontender, normal BS Ext: without clubbing, cyanosis, edema Neuro: grossly intact Skin: Limited exam, no lesions noted   DATA:   BMP Latest Ref Rng & Units 02/05/2018 01/31/2017 09/21/2015  Glucose 70 - 99 mg/dL 92 92 86  BUN 6 - 23 mg/dL 12 13 12   Creatinine 0.40 - 1.20 mg/dL 1.610.78 0.960.70 0.450.69  Sodium 135 - 145 mEq/L 137 137 139  Potassium 3.5 - 5.1 mEq/L 3.8 4.6  3.6  Chloride 96 - 112 mEq/L 103 103 104  CO2 19 - 32 mEq/L 25 27 28   Calcium 8.4 - 10.5 mg/dL 9.1 9.3 9.3    CBC Latest Ref Rng & Units 09/20/2017  WBC 4.0 - 10.5 K/uL 8.6  Hemoglobin 12.0 - 15.0 g/dL 40.913.5  Hematocrit 81.136.0 - 46.0 % 38.8  Platelets 150.0 - 400.0 K/uL 294.0    CXR: No new film  I have personally reviewed all chest radiographs reported above including CXRs and CT chest unless otherwise indicated  IMPRESSION:     ICD-10-CM   1. Mild intermittent asthma without complication J45.20    Episodic dyspnea and chest tightness responsive to albuterol is virtually diagnostic of asthma.  Hers is best classified as mild intermittent and does not presently require controller therapy.   PLAN:  She is instructed to continue albuterol as needed for increased shortness of breath, wheezing, chest tightness, cough  She is also instructed that she may use it prior to exercise  She is to follow-up as needed.  In particular, I want to hear back from her if she is using her albuterol for the above symptoms more than 3-4 times per week at which point, we would consider initiating controller medication.   Billy Fischeravid Rayshawn Visconti, MD PCCM service Mobile [phone] Pager [phone] 03/04/2018 1:19 PM

## 2018-03-12 ENCOUNTER — Ambulatory Visit: Payer: BLUE CROSS/BLUE SHIELD | Admitting: Pulmonary Disease

## 2018-04-05 DIAGNOSIS — M542 Cervicalgia: Secondary | ICD-10-CM | POA: Diagnosis not present

## 2018-04-05 DIAGNOSIS — M546 Pain in thoracic spine: Secondary | ICD-10-CM | POA: Diagnosis not present

## 2018-04-05 DIAGNOSIS — M6281 Muscle weakness (generalized): Secondary | ICD-10-CM | POA: Diagnosis not present

## 2018-04-16 DIAGNOSIS — M546 Pain in thoracic spine: Secondary | ICD-10-CM | POA: Diagnosis not present

## 2018-04-16 DIAGNOSIS — M6281 Muscle weakness (generalized): Secondary | ICD-10-CM | POA: Diagnosis not present

## 2018-04-16 DIAGNOSIS — M542 Cervicalgia: Secondary | ICD-10-CM | POA: Diagnosis not present

## 2018-04-19 DIAGNOSIS — M542 Cervicalgia: Secondary | ICD-10-CM | POA: Diagnosis not present

## 2018-04-19 DIAGNOSIS — M546 Pain in thoracic spine: Secondary | ICD-10-CM | POA: Diagnosis not present

## 2018-04-19 DIAGNOSIS — M6281 Muscle weakness (generalized): Secondary | ICD-10-CM | POA: Diagnosis not present

## 2018-04-23 DIAGNOSIS — M542 Cervicalgia: Secondary | ICD-10-CM | POA: Diagnosis not present

## 2018-04-23 DIAGNOSIS — M546 Pain in thoracic spine: Secondary | ICD-10-CM | POA: Diagnosis not present

## 2018-04-23 DIAGNOSIS — M6281 Muscle weakness (generalized): Secondary | ICD-10-CM | POA: Diagnosis not present

## 2018-04-25 ENCOUNTER — Encounter: Payer: Self-pay | Admitting: Family Medicine

## 2018-04-25 DIAGNOSIS — M546 Pain in thoracic spine: Secondary | ICD-10-CM | POA: Diagnosis not present

## 2018-04-25 DIAGNOSIS — M542 Cervicalgia: Secondary | ICD-10-CM | POA: Diagnosis not present

## 2018-04-25 DIAGNOSIS — M6281 Muscle weakness (generalized): Secondary | ICD-10-CM | POA: Diagnosis not present

## 2018-04-25 DIAGNOSIS — J4520 Mild intermittent asthma, uncomplicated: Secondary | ICD-10-CM | POA: Insufficient documentation

## 2018-04-30 DIAGNOSIS — M6281 Muscle weakness (generalized): Secondary | ICD-10-CM | POA: Diagnosis not present

## 2018-04-30 DIAGNOSIS — M546 Pain in thoracic spine: Secondary | ICD-10-CM | POA: Diagnosis not present

## 2018-04-30 DIAGNOSIS — M542 Cervicalgia: Secondary | ICD-10-CM | POA: Diagnosis not present

## 2018-05-02 DIAGNOSIS — M542 Cervicalgia: Secondary | ICD-10-CM | POA: Diagnosis not present

## 2018-05-02 DIAGNOSIS — M6281 Muscle weakness (generalized): Secondary | ICD-10-CM | POA: Diagnosis not present

## 2018-05-02 DIAGNOSIS — M546 Pain in thoracic spine: Secondary | ICD-10-CM | POA: Diagnosis not present

## 2018-05-07 DIAGNOSIS — M6281 Muscle weakness (generalized): Secondary | ICD-10-CM | POA: Diagnosis not present

## 2018-05-07 DIAGNOSIS — M546 Pain in thoracic spine: Secondary | ICD-10-CM | POA: Diagnosis not present

## 2018-05-07 DIAGNOSIS — M542 Cervicalgia: Secondary | ICD-10-CM | POA: Diagnosis not present

## 2018-05-15 DIAGNOSIS — M546 Pain in thoracic spine: Secondary | ICD-10-CM | POA: Diagnosis not present

## 2018-05-15 DIAGNOSIS — M6281 Muscle weakness (generalized): Secondary | ICD-10-CM | POA: Diagnosis not present

## 2018-05-15 DIAGNOSIS — M542 Cervicalgia: Secondary | ICD-10-CM | POA: Diagnosis not present

## 2018-10-02 ENCOUNTER — Other Ambulatory Visit: Payer: Self-pay | Admitting: Obstetrics and Gynecology

## 2018-10-02 DIAGNOSIS — N644 Mastodynia: Secondary | ICD-10-CM | POA: Diagnosis not present

## 2018-10-08 ENCOUNTER — Ambulatory Visit
Admission: RE | Admit: 2018-10-08 | Discharge: 2018-10-08 | Disposition: A | Discharge status: Home / Self Care | Payer: BLUE CROSS/BLUE SHIELD | Source: Ambulatory Visit | Attending: Obstetrics and Gynecology | Admitting: Obstetrics and Gynecology

## 2018-10-08 ENCOUNTER — Ambulatory Visit
Admission: RE | Admit: 2018-10-08 | Discharge: 2018-10-08 | Disposition: A | Discharge status: Home / Self Care | Payer: BC Managed Care – PPO | Source: Ambulatory Visit | Attending: Obstetrics and Gynecology | Admitting: Obstetrics and Gynecology

## 2018-10-08 ENCOUNTER — Other Ambulatory Visit: Payer: Self-pay

## 2018-10-08 DIAGNOSIS — R922 Inconclusive mammogram: Secondary | ICD-10-CM | POA: Diagnosis not present

## 2018-10-08 DIAGNOSIS — N6489 Other specified disorders of breast: Secondary | ICD-10-CM | POA: Diagnosis not present

## 2018-10-08 DIAGNOSIS — N644 Mastodynia: Secondary | ICD-10-CM

## 2018-11-18 ENCOUNTER — Other Ambulatory Visit: Payer: Self-pay

## 2018-11-18 ENCOUNTER — Ambulatory Visit
Admission: EM | Admit: 2018-11-18 | Discharge: 2018-11-18 | Disposition: A | Discharge status: Home / Self Care | Payer: BC Managed Care – PPO | Attending: Emergency Medicine | Admitting: Emergency Medicine

## 2018-11-18 DIAGNOSIS — S61213A Laceration without foreign body of left middle finger without damage to nail, initial encounter: Secondary | ICD-10-CM | POA: Diagnosis not present

## 2018-11-18 DIAGNOSIS — W293XXA Contact with powered garden and outdoor hand tools and machinery, initial encounter: Secondary | ICD-10-CM

## 2018-11-18 NOTE — ED Triage Notes (Signed)
Patient states that she was using hedge trimmer and cut her left middle finger.

## 2018-11-18 NOTE — ED Provider Notes (Signed)
[facility] [place]   Name: Tiffany BeneKelly Zamora DOB: 08/16/1970 MRN: [id number] CSN: [id number] PCP: Eustaquio BoydenGutierrez, Javier, MD  Arrival date and time:  11/18/18 1201  Chief Complaint:  Laceration (left middle finger)   NOTE: Prior to seeing the patient today, I have reviewed the triage nursing documentation and vital signs. Clinical staff has updated patient's PMH/PSHx, current medication list, and drug allergies/intolerances to ensure comprehensive history available to assist in medical decision making.   History:   HPI: Tiffany Zamora is a 48 y.o. female who presents today with complaints of a laceration to her left middle finger. She was using a Counsellorhedge trimmer and misplaced her hand. She came to the clinic as soon aright after the injury, about 30 mins ago. She denies any numbness or tingling to the tip of her finger.    Past Medical History:  Diagnosis Date  . Benign hematuria ~2005   per pt normal workup with cystoscopy [facility]  . Heartburn   . IBS (irritable bowel syndrome) 09/25/2015  . Seasonal allergies   . Urine incontinence     Past Surgical History:  Procedure Laterality Date  . COLONOSCOPY  2000   for IBS sxs, WNL per patient  . DILATION AND CURETTAGE OF UTERUS  08/2007  . WISDOM TOOTH EXTRACTION  1993    Family History  Problem Relation Age of Onset  . Diabetes Sister   . CAD Maternal Grandfather 3061       MI  . Hypertension Mother   . Hypertension Father   . Dementia Maternal Grandmother 95  . Cancer Neg Hx   . Stroke Neg Hx     Social History   Tobacco Use  . Smoking status: Never Smoker  . Smokeless tobacco: Never Used  Substance Use Topics  . Alcohol use: Yes    Alcohol/week: 0.0 standard drinks    Comment: wine weekly  . Drug use: No    Patient Active Problem List   Diagnosis Date Noted  . Mild intermittent asthma 04/25/2018  . Chronic dyspnea 09/20/2017  . Fracture of metacarpal bone 05/25/2017  . Cervical pain (neck) 01/31/2017   . Hypertriglyceridemia 09/25/2015  . Health maintenance examination 01/20/2014  . Benign hematuria     Home Medications:    Current Meds  Medication Sig  . levonorgestrel-ethinyl estradiol (LUTERA) 0.1-20 MG-MCG tablet Take 1 tablet by mouth daily.    Allergies:   Patient has no known allergies.  Review of Systems (ROS): Review of Systems  Skin: Positive for wound.  All other systems reviewed and are negative.    Vital Signs: Today's Vitals   11/18/18 1221 11/18/18 1222  BP:  (!) 143/83  Pulse:  76  Resp:  18  Temp:  98.5 F (36.9 C)  TempSrc:  Oral  SpO2:  100%  Weight: 170 lb (77.1 kg)   Height: 5\' 6"  (1.676 m)   PainSc: 5      Physical Exam: Physical Exam Vitals signs and nursing note reviewed.  Constitutional:      Appearance: Normal appearance.  Skin:      Neurological:     Mental Status: She is alert.  Psychiatric:        Mood and Affect: Mood normal.        Behavior: Behavior normal.     Urgent Care Treatments / Results:   LABS: PLEASE NOTE: all labs that were ordered this encounter are listed, however only abnormal results are displayed. Labs Reviewed - No  data to display  EKG: -None  RADIOLOGY: No results found.  PROCEDURES: Laceration Repair  Date/Time: 11/18/2018 1:39 PM Performed by: Tiffany Baron, NP Authorized by: Tiffany Baron, NP   Consent:    Consent obtained:  Verbal   Consent given by:  Patient   Risks discussed:  Pain and infection Anesthesia (see MAR for exact dosages):    Anesthesia method:  Local infiltration   Local anesthetic:  Lidocaine 1% w/o epi Laceration details:    Location:  Finger   Finger location:  L long finger   Length (cm):  2 Pre-procedure details:    Preparation:  Patient was prepped and draped in usual sterile fashion Exploration:    Hemostasis achieved with:  Direct pressure   Wound extent: no foreign bodies/material noted, no muscle damage noted, no tendon damage noted and no  underlying fracture noted     Contaminated: no   Treatment:    Area cleansed with:  Betadine   Amount of cleaning:  Standard   Irrigation solution:  Sterile saline   Irrigation volume:  30 ml   Irrigation method:  Syringe Mucous membrane repair:    Suture size:  4-0   Suture material:  Vicryl   Suture technique:  Simple interrupted Skin repair:    Repair method:  Sutures Approximation:    Approximation:  Close (with a few areas of loose aproximation due to jagged tissue) Post-procedure details:    Dressing:  Antibiotic ointment and non-adherent dressing   Patient tolerance of procedure:  Tolerated well, no immediate complications    MEDICATIONS RECEIVED THIS VISIT: Medications - No data to display  PERTINENT CLINICAL COURSE NOTES/UPDATES:   Initial Impression / Assessment and Plan / Urgent Care Course:  Pertinent labs & imaging results that were available during my care of the patient were personally reviewed by me and considered in my medical decision making (see lab/imaging section of note for values and interpretations).  Tiffany Zamora is a 48 y.o. female who presents to [facility] today with complaints of a finger laceration, diagnosed with the same, and treated as such with the procedure above. NP and patient reviewed discharge instructions below during visit.   Patient is well appearing overall in clinic today. She does not appear to be in any acute distress. Presenting symptoms (see HPI) and exam as documented above.   I have reviewed the follow up and strict return precautions for any new or worsening symptoms. Patient is aware of symptoms that would be deemed urgent/emergent, and would thus require further evaluation either here or in the emergency department. At the time of discharge, she verbalized understanding and consent with the discharge plan as it was reviewed with her. All questions were fielded by provider and/or clinic staff prior to patient discharge.     Final Clinical Impressions / Urgent Care Diagnoses:   Final diagnoses:  Laceration of left middle finger without foreign body without damage to nail, initial encounter    New Prescriptions:  [place] Controlled Substance Registry consulted? Not Applicable  No orders of the defined types were placed in this encounter.     Discharge Instructions     You have 5 sutures (stiches) to your cut on your left middle finger. Keep the area covered for two days, then leave it uncovered when there is little/no chance that contamination.   Follow-up in 7-10 days for possible removal.   Follow-up sooner if signs or symptoms of infection arise.      Recommended Follow  up Care:  Patient encouraged to follow up with the following provider within the specified time frame, or sooner as dictated by the severity of her symptoms. As always, she was instructed that for any urgent/emergent care needs, she should seek care either here or in the emergency department for more immediate evaluation.   Bailey Mech, DNP, NP-c    Bailey Mech, NP 11/18/18 1342

## 2018-11-18 NOTE — Discharge Instructions (Addendum)
You have 5 sutures (stiches) to your cut on your left middle finger. Keep the area covered for two days, then leave it uncovered when there is little/no chance that contamination.   Follow-up in 7-10 days for possible removal.   Follow-up sooner if signs or symptoms of infection arise.

## 2018-11-27 ENCOUNTER — Encounter: Payer: Self-pay | Admitting: Family Medicine

## 2018-11-27 ENCOUNTER — Ambulatory Visit: Payer: BC Managed Care – PPO | Admitting: Family Medicine

## 2018-11-27 ENCOUNTER — Other Ambulatory Visit: Payer: Self-pay

## 2018-11-27 DIAGNOSIS — S61219A Laceration without foreign body of unspecified finger without damage to nail, initial encounter: Secondary | ICD-10-CM | POA: Diagnosis not present

## 2018-11-27 NOTE — Assessment & Plan Note (Addendum)
Sutures removed, pt tolerated well. After care instructions reviewed.

## 2018-11-27 NOTE — Progress Notes (Signed)
   This visit was conducted in person.  BP 130/80 (BP Location: Left Arm, Patient Position: Sitting, Cuff Size: Normal)   Pulse 88   Temp 97.9 F (36.6 C) (Temporal)   Ht 5' 5.5" (1.664 m)   Wt 174 lb 2 oz (79 kg)   LMP 11/21/2018   SpO2 98%   BMI 28.54 kg/m    CC: suture removal Subjective:    Patient ID: Tiffany Zamora, female    DOB: [birth year], 48 y.o.   MRN: [id number]  HPI: Tiffany Zamora is a 48 y.o. female presenting on 11/27/2018 for Suture / Staple Removal (Had 5 sutures applied to laceration left middle finger on 11/18/18 at [place].  Told to have removed in 7-10 days.  Denies any sxs of infection. )   DOI: 11/18/2018 Cute L middle finger while using hedge trimmer to cut bushes at home.  5 simple sutures placed 11/18/2018 at [place]. Note reviewed.  Here for suture removal.  Tolerated sutures well. Denies redness or discharge or pain.      Relevant past medical, surgical, family and social history reviewed and updated as indicated. Interim medical history since our last visit reviewed. Allergies and medications reviewed and updated. Outpatient Medications Prior to Visit  Medication Sig Dispense Refill  . albuterol (PROVENTIL HFA;VENTOLIN HFA) 108 (90 Base) MCG/ACT inhaler Inhale 1-2 puffs into the lungs every 4 (four) hours as needed for wheezing or shortness of breath. 1 Inhaler 10  . levonorgestrel-ethinyl estradiol (LUTERA) 0.1-20 MG-MCG tablet Take 1 tablet by mouth daily.     No facility-administered medications prior to visit.      Per HPI unless specifically indicated in ROS section below Review of Systems Objective:    BP 130/80 (BP Location: Left Arm, Patient Position: Sitting, Cuff Size: Normal)   Pulse 88   Temp 97.9 F (36.6 C) (Temporal)   Ht 5' 5.5" (1.664 m)   Wt 174 lb 2 oz (79 kg)   LMP 11/21/2018   SpO2 98%   BMI 28.54 kg/m   Wt Readings from Last 3 Encounters:  11/27/18 174 lb 2 oz (79 kg)  11/18/18 170 lb (77.1 kg)  02/23/18 170 lb  9.6 oz (77.4 kg)    Physical Exam Vitals signs and nursing note reviewed.  Constitutional:      General: She is not in acute distress.    Appearance: Normal appearance. She is not ill-appearing.  Musculoskeletal: Normal range of motion.     Comments: 2+ rad pulses bilat. Sensation intact  Skin:    Findings: Laceration (U shaped laceration to mid phalanx R middle finger) present. No erythema or rash.     Comments: Wound scabbed over, edges well approximated, without surrounding erythema or drainage  Neurological:     Mental Status: She is alert.       Suture removal: 5 sutures removed from L middle finger laceration without complication, pt tolerated well. Wound cleaned with alcohol pad and dressed with abx and bandage Assessment & Plan:   Problem List Items Addressed This Visit    Finger laceration, initial encounter    Sutures removed, pt tolerated well. After care instructions reviewed.           No orders of the defined types were placed in this encounter.  No orders of the defined types were placed in this encounter.   Follow up plan: No follow-ups on file.  [name], MD

## 2019-01-15 ENCOUNTER — Other Ambulatory Visit (HOSPITAL_COMMUNITY)
Admission: RE | Admit: 2019-01-15 | Discharge: 2019-01-15 | Disposition: A | Discharge status: Home / Self Care | Payer: BC Managed Care – PPO | Source: Ambulatory Visit | Attending: Obstetrics and Gynecology | Admitting: Obstetrics and Gynecology

## 2019-01-15 DIAGNOSIS — Z01419 Encounter for gynecological examination (general) (routine) without abnormal findings: Secondary | ICD-10-CM | POA: Insufficient documentation

## 2019-01-15 LAB — RESULTS CONSOLE HPV: CHL HPV: NEGATIVE

## 2019-01-15 LAB — HM PAP SMEAR

## 2019-01-17 LAB — CYTOLOGY - PAP
Comment: NEGATIVE
Diagnosis: NEGATIVE
High risk HPV: NEGATIVE

## 2019-02-24 DIAGNOSIS — Z20828 Contact with and (suspected) exposure to other viral communicable diseases: Secondary | ICD-10-CM | POA: Diagnosis not present

## 2019-04-18 DIAGNOSIS — J069 Acute upper respiratory infection, unspecified: Secondary | ICD-10-CM | POA: Diagnosis not present

## 2019-04-23 ENCOUNTER — Telehealth: Payer: Self-pay | Admitting: Family Medicine

## 2019-04-23 NOTE — Telephone Encounter (Signed)
OK with me.

## 2019-04-23 NOTE — Telephone Encounter (Signed)
Ok by me - very nice patient.

## 2019-04-23 NOTE — Telephone Encounter (Signed)
Patient called to schedule her annual physical Patient would really prefer a female provider and would like to know if she can transfer her care over to Dr Selena Batten  Is this TOC okay?

## 2019-05-23 ENCOUNTER — Other Ambulatory Visit: Payer: Self-pay

## 2019-05-23 ENCOUNTER — Encounter: Payer: Self-pay | Admitting: Family Medicine

## 2019-05-23 ENCOUNTER — Ambulatory Visit (INDEPENDENT_AMBULATORY_CARE_PROVIDER_SITE_OTHER): Payer: BC Managed Care – PPO | Admitting: Family Medicine

## 2019-05-23 VITALS — BP 132/78 | HR 71 | Temp 98.2°F | Ht 65.5 in | Wt 174.5 lb

## 2019-05-23 DIAGNOSIS — Z131 Encounter for screening for diabetes mellitus: Secondary | ICD-10-CM | POA: Diagnosis not present

## 2019-05-23 DIAGNOSIS — R32 Unspecified urinary incontinence: Secondary | ICD-10-CM | POA: Insufficient documentation

## 2019-05-23 DIAGNOSIS — Z0000 Encounter for general adult medical examination without abnormal findings: Secondary | ICD-10-CM | POA: Diagnosis not present

## 2019-05-23 DIAGNOSIS — Z13220 Encounter for screening for lipoid disorders: Secondary | ICD-10-CM

## 2019-05-23 NOTE — Progress Notes (Signed)
Annual Exam   Chief Complaint:  Chief Complaint  Patient presents with  . Annual Exam    History of Present Illness:  Ms. Annice Jolly is a 49 y.o. No obstetric history on file. who LMP was Patient's last menstrual period was 05/08/2019., presents today for her annual examination.    #Axillary symptoms - concerned about lymph nodes - somewhat tender when she touches - other spot feels like a pressure/pulling when she raises her arm - normal mammogram and axillary [place] in August - symptoms >6 months  Nutrition She does get adequate calcium and Vitamin D in her diet. Diet: trying to do better Exercise: 3-5 times a week  Safety The patient wears seatbelts: yes.     The patient feels safe at home and in their relationships: yes.   Menstrual Her menses are regular every 28-30 days, light periods which are getting lighter.  Taking OCPs  GYN She is single partner, contraception - OCP (estrogen/progesterone).    Cervical Cancer Screening:   Last Pap:   October 2020 Results were: no abnormalities /neg HPV DNA   Abnormal PAP in 1993  Breast Cancer Screening There is no FH of breast cancer. There is no FH of ovarian cancer. BRCA screening Not Indicated.  Discussed that for average risk women between age 48-49 screening may reduce the risk of breast cancer death, however, at a lower rate than those over age 5. And that the the false-positive rates resulting in unnecessary biopsies with more screening is higher. The balance of benefits vs harms likely improves as you progress through your 40s. The patient does want a mammogram this year. - gets with GYN and last was 8/3  Weight Wt Readings from Last 3 Encounters:  05/23/19 174 lb 8 oz (79.2 kg)  11/27/18 174 lb 2 oz (79 kg)  11/18/18 170 lb (77.1 kg)   Patient has high BMI  BMI Readings from Last 1 Encounters:  05/23/19 28.60 kg/m     Chronic disease screening Blood pressure monitoring:  BP Readings from Last 3  Encounters:  05/23/19 132/78  11/27/18 130/80  11/18/18 (!) 143/83    Lipid Monitoring: Indication for screening: age >69, obesity, diabetes, family hx, CV risk factors.  Lipid screening: Yes  Lab Results  Component Value Date   CHOL 165 02/05/2018   HDL 57.70 02/05/2018   LDLCALC 75 02/05/2018   LDLDIRECT 93.0 09/21/2015   TRIG 158.0 (H) 02/05/2018   CHOLHDL 3 02/05/2018     Diabetes Screening: age >52, overweight, family hx, PCOS, hx of gestational diabetes, at risk ethnicity Diabetes Screening screening: Yes  No results found for: HGBA1C   Past Medical History:  Diagnosis Date  . Benign hematuria ~2005   per pt normal workup with cystoscopy [facility])  . Heartburn   . IBS (irritable bowel syndrome) 09/25/2015  . Seasonal allergies   . Urine incontinence     Past Surgical History:  Procedure Laterality Date  . COLONOSCOPY  2000   for IBS sxs, WNL per patient  . DILATION AND CURETTAGE OF UTERUS  08/2007  . [place]    Prior to Admission medications   Medication Sig Start Date End Date Taking? Authorizing Provider  albuterol (PROVENTIL HFA;VENTOLIN HFA) 108 (90 Base) MCG/ACT inhaler Inhale 1-2 puffs into the lungs every 4 (four) hours as needed for wheezing or shortness of breath. 01/29/18  Yes Wilhelmina Mcardle, MD  levonorgestrel-ethinyl estradiol (LUTERA) 0.1-20 MG-MCG tablet Take 1 tablet by mouth daily.  Yes [provider]    No Known Allergies  Gynecologic History: Patient's last menstrual period was 05/08/2019.  Obstetric History: No obstetric history on file.  Social History   Socioeconomic History  . Marital status: Married    Spouse name: Thurmond Butts  . Number of children: 1  . Years of education: Masters  . Highest education level: Not on file  Occupational History  . Not on file  Tobacco Use  . Smoking status: Never Smoker  . Smokeless tobacco: Never Used  Substance and Sexual Activity  . Alcohol use: Yes     Alcohol/week: 0.0 standard drinks    Comment: a few times a week, 2 glasses  . Drug use: No  . Sexual activity: Yes    Birth control/protection: Pill  Other Topics Concern  . 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 Difficulty of Paying Living Expenses:   Food Insecurity:   . Worried About Charity fundraiser in the Last Year:   . Arboriculturist in the Last Year:   Transportation Needs:   . Film/video editor (Medical):   Marland Kitchen Lack of Transportation (Non-Medical):   Physical Activity:   . Days of Exercise per Week:   . Minutes of Exercise per Session:   Stress:   . Feeling of Stress :   Social Connections:   . Frequency of Communication with Friends and Family:   . Frequency of Social Gatherings with Friends and Family:   . Attends Religious Services:   . Active Member of Clubs or Organizations:   . Attends Archivist Meetings:   Marland Kitchen Marital Status:   Intimate Partner Violence:   . Fear of Current or Ex-Partner:   . Emotionally Abused:   Marland Kitchen Physically Abused:   . Sexually Abused:     Family History  Problem Relation Age of Onset  . Diabetes Sister   . Heart attack Maternal Grandfather 61       MI  . Hypertension Mother   . Tongue cancer Mother        prior smoker  .  Hypertension Father   . Dementia Maternal Grandmother 95       died at 33  . Cancer Neg Hx   . Stroke Neg Hx     Review of Systems  Constitutional: Negative for chills and fever.  HENT: Negative for congestion and sore throat.   Eyes: Negative for blurred vision and double vision.  Respiratory: Negative for shortness of breath.   Cardiovascular: Negative for chest pain.  Gastrointestinal: Negative for heartburn, nausea and vomiting.  Genitourinary: Negative.   Musculoskeletal: Negative.  Negative for myalgias.  Skin: Negative for rash.  Neurological: Negative for dizziness and headaches.  Endo/Heme/Allergies: Does not bruise/bleed easily.  Psychiatric/Behavioral: Negative for depression. The patient is not nervous/anxious.      Physical Exam BP 132/78 (BP Location: Left Arm, Patient Position: Sitting, Cuff Size: Normal)   Pulse 71   Temp 98.2 F (36.8 C) (Temporal)   Ht 5' 5.5" (1.664 m)   Wt 174 lb 8 oz (79.2 kg)   LMP 05/08/2019   SpO2 100%   BMI 28.60 kg/m    BP Readings from Last 3 Encounters:  05/23/19 132/78  11/27/18 130/80  11/18/18 (!) 143/83      Physical Exam Constitutional:      General: She is not in acute distress.    Appearance: She is well-developed. She is not diaphoretic.  HENT:     Head: Normocephalic and atraumatic.     Right Ear: External ear normal.     Left Ear: External ear normal.     Nose: Nose normal.  Eyes:     General: No scleral icterus.    Conjunctiva/sclera: Conjunctivae normal.  Cardiovascular:     Rate and Rhythm: Normal rate and regular rhythm.     Heart sounds: No murmur.  Pulmonary:     Effort: Pulmonary effort is normal. No respiratory distress.     Breath sounds: Normal breath sounds. No wheezing.  Chest:     Comments: The right lateral chest adjacent to the breast with mild muscular hypertrophy and some ttp. No skin changes, no masses Abdominal:     General: Bowel sounds are normal. There is no distension.      Palpations: Abdomen is soft. There is no mass.     Tenderness: There is no abdominal tenderness. There is no guarding or rebound.  Musculoskeletal:        General: Normal range of motion.     Cervical back: Neck supple.  Lymphadenopathy:     Cervical: No cervical adenopathy.     Upper Body:     Right upper body: No axillary adenopathy.     Left upper body: No axillary adenopathy.  Skin:    General: Skin is warm and dry.     Capillary Refill: Capillary refill takes less than 2 seconds.  Neurological:     Mental Status: She is alert and oriented to person, place, and time.     Deep Tendon Reflexes: Reflexes normal.  Psychiatric:        Behavior: Behavior normal.      Results:  PHQ-9:    Office Visit from 11/02/2017 in [place] at [facility]  PHQ-9 Total Score  5        Assessment: 49 y.o. No obstetric history on file. female here for routine annual physical examination.  Plan: Problem List Items Addressed This Visit      Other   Urinary incontinence    Other Visit Diagnoses    Annual physical exam    -  Primary   Screening for hyperlipidemia       Relevant Orders   Lipid panel   Screening for diabetes mellitus       Relevant Orders   Basic metabolic panel      Screening: -- Blood pressure screen normal -- cholesterol screening: will obtain -- Weight screening: overweight: continue to monitor -- Diabetes Screening: will obtain -- Nutrition: normal - encouraged healthy eating  The 10-year ASCVD risk score Mikey Bussing  DC Jr., et al., 2013) is: 0.9%   Values used to calculate the score:     Age: 65 years     Sex: Female     Is Non-Hispanic African American: No     Diabetic: No     Tobacco smoker: No     Systolic Blood Pressure: 275 mmHg     Is BP treated: No     HDL Cholesterol: 57.7 mg/dL     Total Cholesterol: 165 mg/dL  -- Statin therapy for Age 58-75 with CVD risk >7.5%  Psych -- Depression screening (PHQ-9):    Office Visit from 11/02/2017  in [place] at [facility]  PHQ-9 Total Score  5       Safety -- tobacco screening: not using -- alcohol screening:  low-risk usage. -- no evidence of domestic violence or intimate partner violence.   Cancer Screening -- pap smear not collected per ASCCP guidelines -- family history of breast cancer screening: done. not at high risk. -- Mammogram - up to date  Immunizations -- flu vaccine up to date -- TDAP q10 years up to date  Encouraged regular vision and dental care.   Reassurance regarding area of concern. No swollen lymph nodes. Suspect muscle strain - recommend stretching routine.    Lesleigh Noe, MD

## 2019-05-23 NOTE — Patient Instructions (Addendum)
Arm pit area 1) No swollen lymph nodes - normal fat distribution 2) Some muscle enlargement - may have some inflammation. I suggest a good upper body stretching routine    Preventive Care 50-50 Years Old, Female Preventive care refers to visits with your health care provider and lifestyle choices that can promote health and wellness. This includes:  A yearly physical exam. This may also be called an annual well check.  Regular dental visits and eye exams.  Immunizations.  Screening for certain conditions.  Healthy lifestyle choices, such as eating a healthy diet, getting regular exercise, not using drugs or products that contain nicotine and tobacco, and limiting alcohol use. What can I expect for my preventive care visit? Physical exam Your health care provider will check your:  Height and weight. This may be used to calculate body mass index (BMI), which tells if you are at a healthy weight.  Heart rate and blood pressure.  Skin for abnormal spots. Counseling Your health care provider may ask you questions about your:  Alcohol, tobacco, and drug use.  Emotional well-being.  Home and relationship well-being.  Sexual activity.  Eating habits.  Work and work Statistician.  Method of birth control.  Menstrual cycle.  Pregnancy history. What immunizations do I need?  Influenza (flu) vaccine  This is recommended every year. Tetanus, diphtheria, and pertussis (Tdap) vaccine  You may need a Td booster every 10 years. Varicella (chickenpox) vaccine  You may need this if you have not been vaccinated. Zoster (shingles) vaccine  You may need this after age 50. Measles, mumps, and rubella (MMR) vaccine  You may need at least one dose of MMR if you were born in 1957 or later. You may also need a second dose. Pneumococcal conjugate (PCV13) vaccine  You may need this if you have certain conditions and were not previously vaccinated. Pneumococcal polysaccharide  (PPSV23) vaccine  You may need one or two doses if you smoke cigarettes or if you have certain conditions. Meningococcal conjugate (MenACWY) vaccine  You may need this if you have certain conditions. Hepatitis A vaccine  You may need this if you have certain conditions or if you travel or work in places where you may be exposed to hepatitis A. Hepatitis B vaccine  You may need this if you have certain conditions or if you travel or work in places where you may be exposed to hepatitis B. Haemophilus influenzae type b (Hib) vaccine  You may need this if you have certain conditions. Human papillomavirus (HPV) vaccine  If recommended by your health care provider, you may need three doses over 6 months. You may receive vaccines as individual doses or as more than one vaccine together in one shot (combination vaccines). Talk with your health care provider about the risks and benefits of combination vaccines. What tests do I need? Blood tests  Lipid and cholesterol levels. These may be checked every 5 years, or more frequently if you are over 63 years old.  Hepatitis C test.  Hepatitis B test. Screening  Lung cancer screening. You may have this screening every year starting at age 35 if you have a 30-pack-year history of smoking and currently smoke or have quit within the past 15 years.  Colorectal cancer screening. All adults should have this screening starting at age 44 and continuing until age 74. Your health care provider may recommend screening at age 53 if you are at increased risk. You will have tests every 1-10 years, depending on your  results and the type of screening test.  Diabetes screening. This is done by checking your blood sugar (glucose) after you have not eaten for a while (fasting). You may have this done every 1-3 years.  Mammogram. This may be done every 1-2 years. Talk with your health care provider about when you should start having regular mammograms. This may  depend on whether you have a family history of breast cancer.  BRCA-related cancer screening. This may be done if you have a family history of breast, ovarian, tubal, or peritoneal cancers.  Pelvic exam and Pap test. This may be done every 3 years starting at age 24. Starting at age 3, this may be done every 5 years if you have a Pap test in combination with an HPV test. Other tests  Sexually transmitted disease (STD) testing.  Bone density scan. This is done to screen for osteoporosis. You may have this scan if you are at high risk for osteoporosis. Follow these instructions at home: Eating and drinking  Eat a diet that includes fresh fruits and vegetables, whole grains, lean protein, and low-fat dairy.  Take vitamin and mineral supplements as recommended by your health care provider.  Do not drink alcohol if: ? Your health care provider tells you not to drink. ? You are pregnant, may be pregnant, or are planning to become pregnant.  If you drink alcohol: ? Limit how much you have to 0-1 drink a day. ? Be aware of how much alcohol is in your drink. In the [place] one drink equals one 12 oz bottle of beer (355 mL), one 5 oz glass of wine (148 mL), or one 1 oz glass of hard liquor (44 mL). Lifestyle  Take daily care of your teeth and gums.  Stay active. Exercise for at least 30 minutes on 5 or more days each week.  Do not use any products that contain nicotine or tobacco, such as cigarettes, e-cigarettes, and chewing tobacco. If you need help quitting, ask your health care provider.  If you are sexually active, practice safe sex. Use a condom or other form of birth control (contraception) in order to prevent pregnancy and STIs (sexually transmitted infections).  If told by your health care provider, take low-dose aspirin daily starting at age 78. What's next?  Visit your health care provider once a year for a well check visit.  Ask your health care provider how often you should  have your eyes and teeth checked.  Stay up to date on all vaccines. This information is not intended to replace advice given to you by your health care provider. Make sure you discuss any questions you have with your health care provider. Document Revised: 11/02/2017 Document Reviewed: 11/02/2017 Elsevier Patient Education  2020 [employer].

## 2019-05-24 LAB — LIPID PANEL
Cholesterol: 173 mg/dL (ref 0–200)
HDL: 61.6 mg/dL (ref >39.00)
LDL Cholesterol: 83 mg/dL (ref 0–99)
NonHDL: 111.38
Total CHOL/HDL Ratio: 3
Triglycerides: 142 mg/dL (ref 0.0–149.0)
VLDL: 28.4 mg/dL (ref 0.0–40.0)

## 2019-05-24 LAB — BASIC METABOLIC PANEL
BUN: 13 mg/dL (ref 6–23)
CO2: 24 mEq/L (ref 19–32)
Calcium: 9.2 mg/dL (ref 8.4–10.5)
Chloride: 101 mEq/L (ref 96–112)
Creatinine, Ser: 0.72 mg/dL (ref 0.40–1.20)
GFR: 85.99 mL/min (ref >60.00)
Glucose, Bld: 90 mg/dL (ref 70–99)
Potassium: 3.9 mEq/L (ref 3.5–5.1)
Sodium: 134 mEq/L — ABNORMAL LOW (ref 135–145)

## 2019-06-03 ENCOUNTER — Other Ambulatory Visit: Payer: Self-pay

## 2019-06-03 ENCOUNTER — Ambulatory Visit: Payer: BC Managed Care – PPO | Attending: Internal Medicine

## 2019-06-03 DIAGNOSIS — Z23 Encounter for immunization: Secondary | ICD-10-CM

## 2019-06-03 NOTE — Progress Notes (Signed)
   Covid-19 Vaccination Clinic  Name:  Tiffany Zamora    MRN: [id number] DOB: October 23, 1970  06/03/2019  Tiffany Zamora was observed post Covid-19 immunization for 15 minutes without incident. She was provided with Vaccine Information Sheet and instruction to access the V-Safe system.   Tiffany Zamora was instructed to call 911 with any severe reactions post vaccine: Marland Kitchen Difficulty breathing  . Swelling of face and throat  . A fast heartbeat  . A bad rash all over body  . Dizziness and weakness   Immunizations Administered    Name Date Dose VIS Date Route   Pfizer COVID-19 Vaccine 06/03/2019 10:09 AM 0.3 mL 02/15/2019 Intramuscular   Manufacturer: [employer], [employer]   Lot: [id number]   NDC: [address]-2

## 2019-06-26 ENCOUNTER — Encounter: Payer: Self-pay | Admitting: Family Medicine

## 2019-06-26 ENCOUNTER — Other Ambulatory Visit: Payer: Self-pay

## 2019-06-26 ENCOUNTER — Ambulatory Visit: Payer: BC Managed Care – PPO | Admitting: Family Medicine

## 2019-06-26 VITALS — BP 112/64 | HR 90 | Temp 98.2°F | Ht 65.5 in | Wt 174.0 lb

## 2019-06-26 DIAGNOSIS — R3129 Other microscopic hematuria: Secondary | ICD-10-CM | POA: Diagnosis not present

## 2019-06-26 DIAGNOSIS — R1030 Lower abdominal pain, unspecified: Secondary | ICD-10-CM | POA: Diagnosis not present

## 2019-06-26 LAB — POC URINALSYSI DIPSTICK (AUTOMATED)
Bilirubin, UA: NEGATIVE
Glucose, UA: NEGATIVE
Ketones, UA: NEGATIVE
Leukocytes, UA: NEGATIVE
Nitrite, UA: NEGATIVE
Protein, UA: NEGATIVE
Spec Grav, UA: 1.015 (ref 1.010–1.025)
Urobilinogen, UA: 0.2 E.U./dL
pH, UA: 7 (ref 5.0–8.0)

## 2019-06-26 MED ORDER — NITROFURANTOIN MONOHYD MACRO 100 MG PO CAPS: 100.0000 mg | ORAL_CAPSULE | Freq: Two times a day (BID) | ORAL | 0 refills | Status: DC

## 2019-06-26 NOTE — Patient Instructions (Signed)
Can take AZO over the counter (pyridium)  I will notify you of urine culture results

## 2019-06-26 NOTE — Progress Notes (Signed)
   Subjective:    Patient ID: Tiffany Zamora, female    DOB: 03-27-1970, 49 y.o.   MRN: [id number]  HPI This is a 49 yo female who presents today with lower abdominal pressure x 3 days. Started after using new soap, [employer]. Increased frequency yesterday, some pressure yesterday and today. Good water intake. LMP 2 weeks ago. No vaginal itching, burning or discharge. Has had prior UTI, not for years. History of hematuria, has been worked up in past by urology.  No vaginal discharge, itching or burning, no fever/chills, some mild nausea, no vomiting, no back pain. No history of kidney stones.    Review of Systems Per HPI    Objective:   Physical Exam Vitals reviewed.  Constitutional:      General: She is not in acute distress.    Appearance: Normal appearance. She is normal weight. She is not ill-appearing, toxic-appearing or diaphoretic.  HENT:     Head: Normocephalic and atraumatic.     Right Ear: External ear normal.     Left Ear: External ear normal.  Eyes:     Conjunctiva/sclera: Conjunctivae normal.  Cardiovascular:     Rate and Rhythm: Normal rate.  Pulmonary:     Effort: Pulmonary effort is normal.  Neurological:     Mental Status: She is alert and oriented to person, place, and time.  Psychiatric:        Mood and Affect: Mood normal.        Behavior: Behavior normal.        Thought Content: Thought content normal.        Judgment: Judgment normal.       BP 112/64 (BP Location: Left Arm, Patient Position: Sitting, Cuff Size: Normal)   Pulse 90   Temp 98.2 F (36.8 C) (Temporal)   Ht 5' 5.5" (1.664 m)   Wt 174 lb (78.9 kg)   SpO2 99%   BMI 28.51 kg/m      Assessment & Plan:  1. Other microscopic hematuria - Urine Culture  2. Lower abdominal pain - ua with blood only, discussed starting antibiotic therapy and will notify her of culture results and length of course of antibiotic - continue good fluid intake, can take otc pyridium as needed, follow  up if any vomiting, fever, severe pain - nitrofurantoin, macrocrystal-monohydrate, (MACROBID) 100 MG capsule; Take 1 capsule (100 mg total) by mouth 2 (two) times daily.  Dispense: 20 capsule; Refill: 0   Olean Ree, FNP-BC  [facility], [facility]  06/26/2019 10:39 AM

## 2019-06-26 NOTE — Addendum Note (Signed)
Addended by: Janelle Floor B on: 06/26/2019 11:39 AM   Modules accepted: Orders

## 2019-06-27 LAB — URINE CULTURE
MICRO NUMBER:: 10389800
Result:: NO GROWTH
SPECIMEN QUALITY:: ADEQUATE

## 2019-07-02 ENCOUNTER — Ambulatory Visit: Payer: BC Managed Care – PPO | Attending: Internal Medicine

## 2019-07-02 DIAGNOSIS — Z23 Encounter for immunization: Secondary | ICD-10-CM

## 2019-07-02 NOTE — Progress Notes (Signed)
   Covid-19 Vaccination Clinic  Name:  Tiffany Zamora    MRN: [id number] DOB: [birth year]  07/02/2019  [name] was observed post Covid-19 immunization for 15 minutes without incident. She was provided with Vaccine Information Sheet and instruction to access the V-Safe system.   [name] was instructed to call 911 with any severe reactions post vaccine: Marland Kitchen Difficulty breathing  . Swelling of face and throat  . A fast heartbeat  . A bad rash all over body  . Dizziness and weakness   Immunizations Administered    Name Date Dose VIS Date Route   Pfizer COVID-19 Vaccine 07/02/2019 10:17 AM 0.3 mL 05/01/2018 Intramuscular   Manufacturer: [employer], [employer]   Lot: [id number]   NDC: [address]-6

## 2019-08-20 IMAGING — DX DG CHEST 2V
2 series · 2 of 2 positions shown · non-contrast
Comparison: None

CLINICAL DATA: Acutely worsening intermittent dyspnea..

EXAM:
CHEST - 2 VIEW

[chest pa]
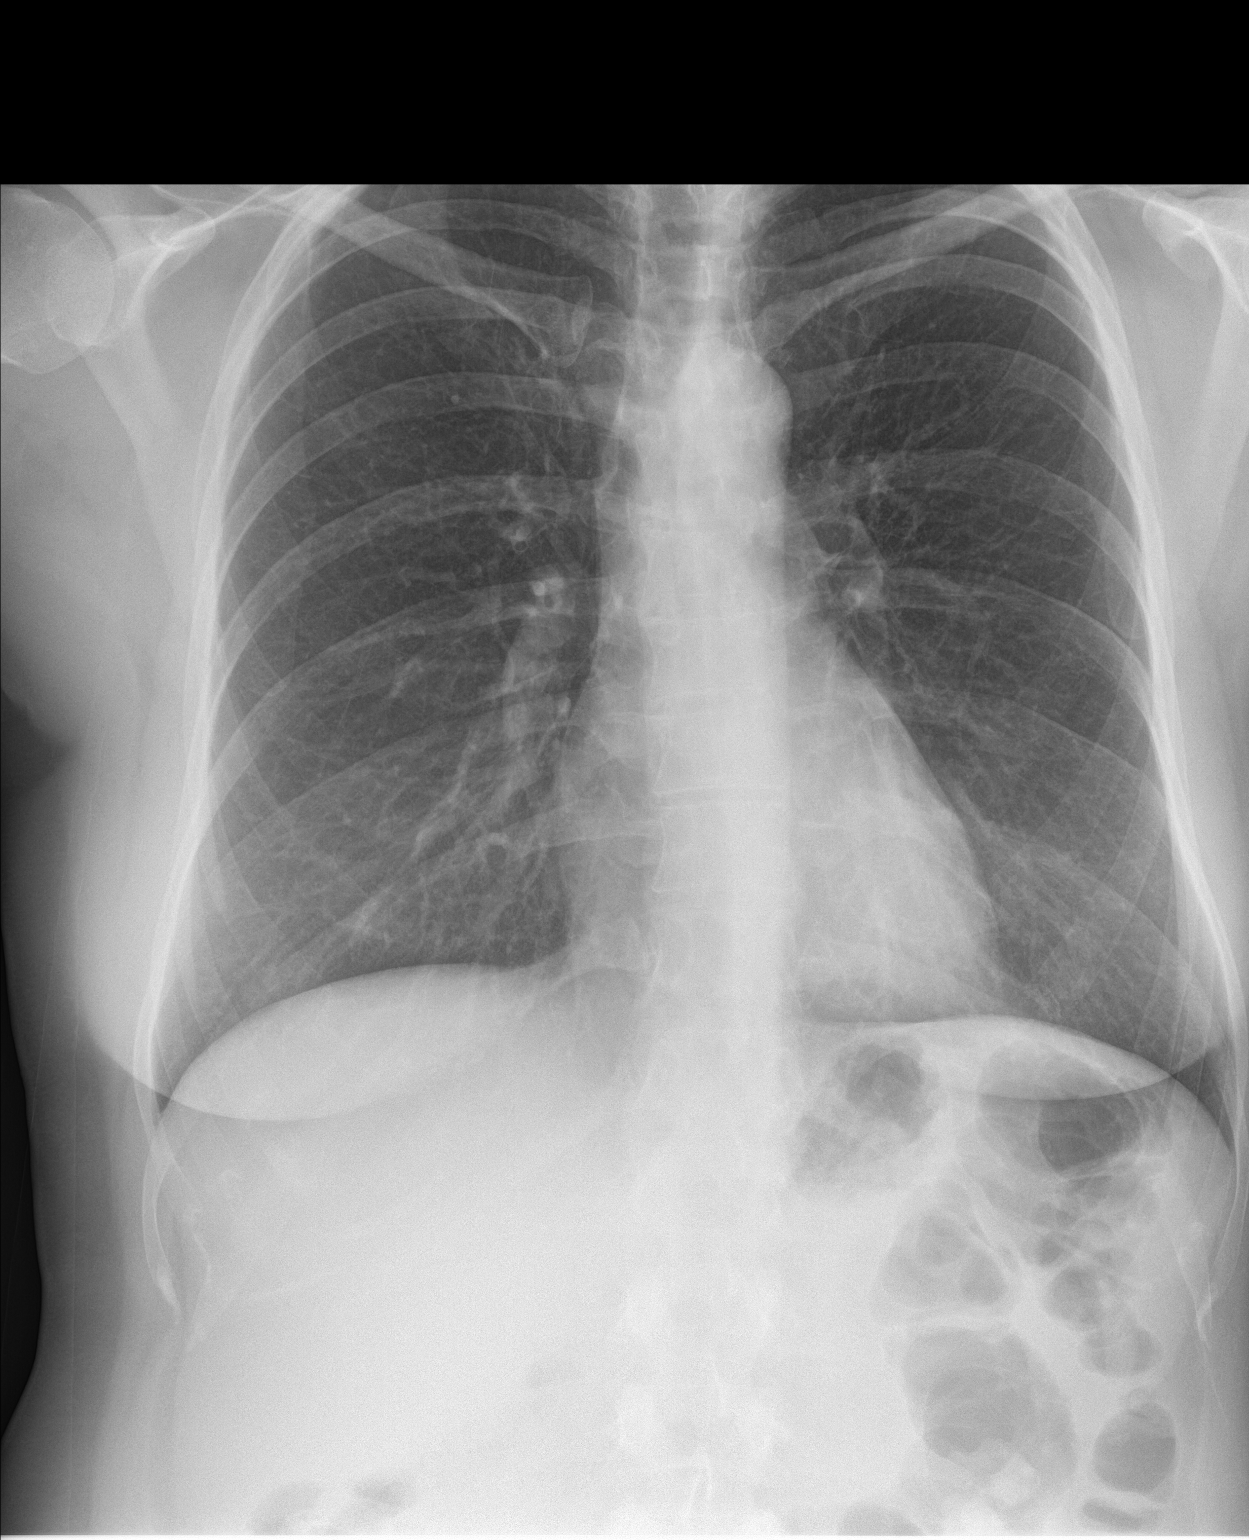

[chest lat]
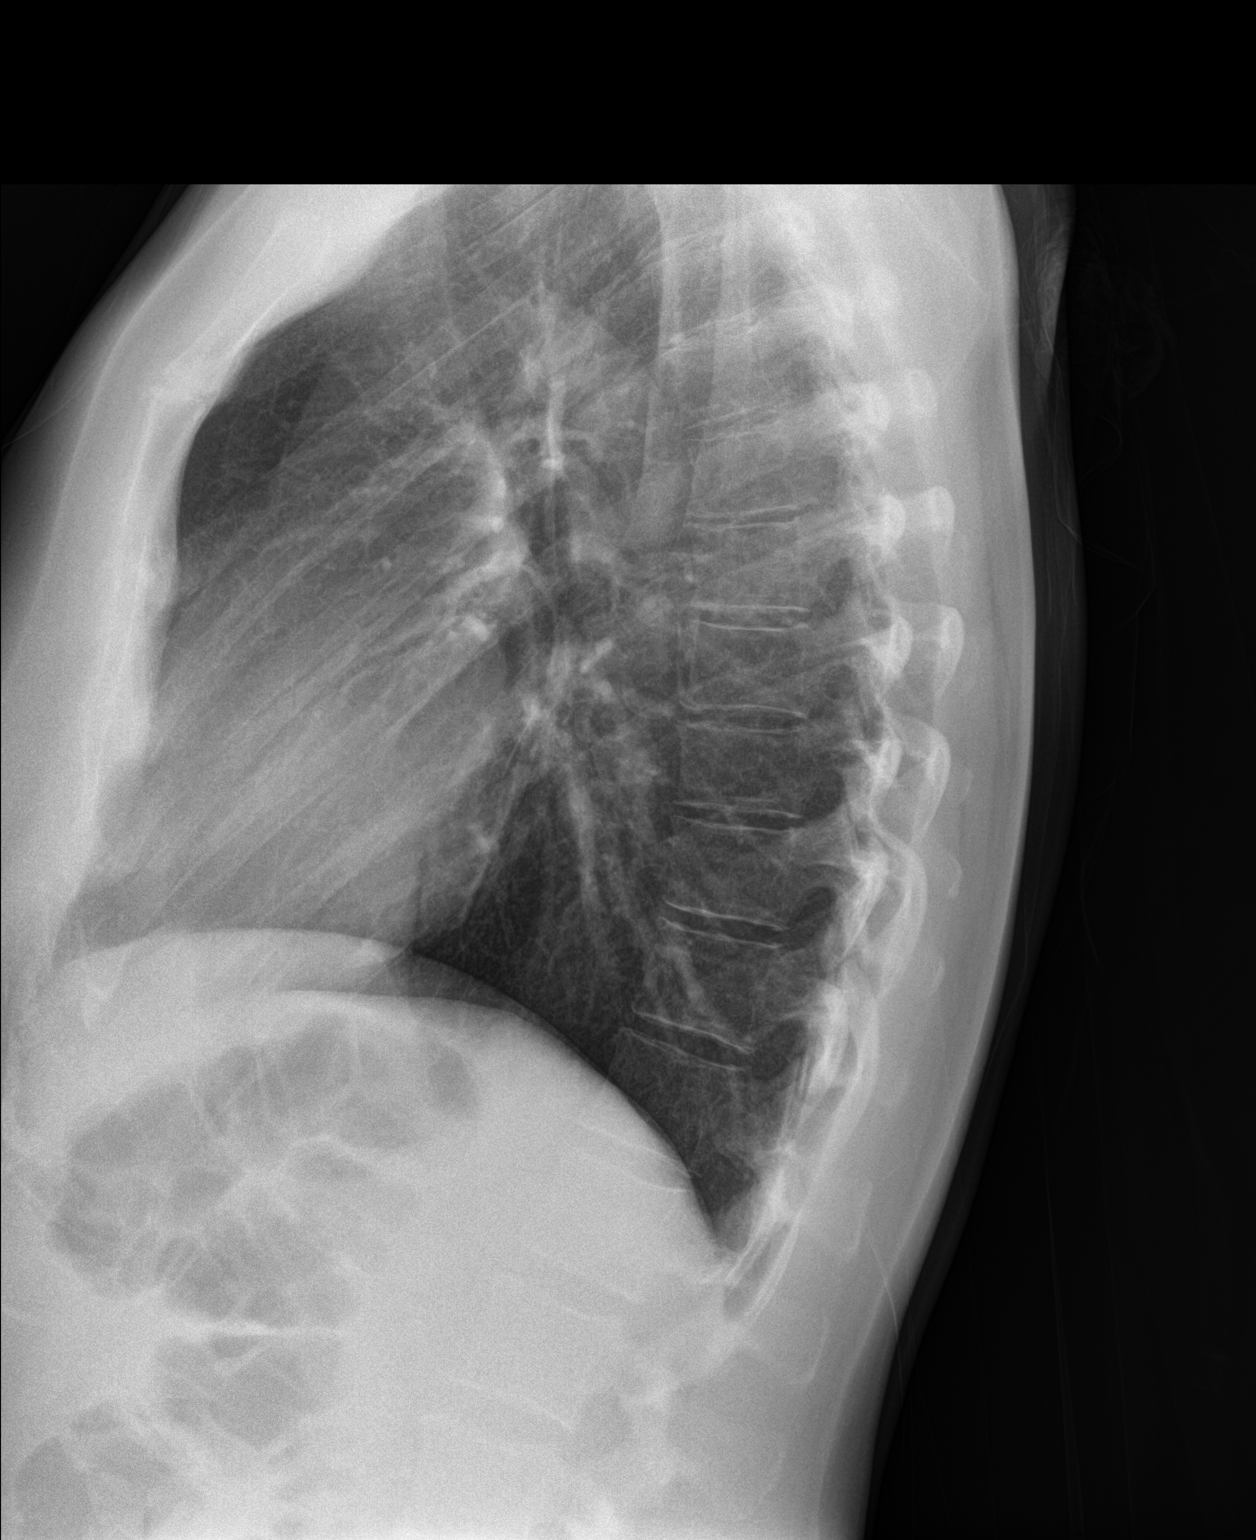

[2 of 2 positions shown; findings below may reference images not displayed]

FINDINGS: The heart size and mediastinal contours are within normal limits.
Both lungs are clear. The visualized skeletal structures are
unremarkable.
IMPRESSION: No active cardiopulmonary disease.

## 2019-08-26 DIAGNOSIS — N762 Acute vulvitis: Secondary | ICD-10-CM | POA: Diagnosis not present

## 2019-09-16 ENCOUNTER — Other Ambulatory Visit: Payer: Self-pay | Admitting: Obstetrics and Gynecology

## 2019-09-26 DIAGNOSIS — N644 Mastodynia: Secondary | ICD-10-CM | POA: Diagnosis not present

## 2019-09-27 ENCOUNTER — Other Ambulatory Visit: Payer: Self-pay | Admitting: Obstetrics and Gynecology

## 2019-09-27 DIAGNOSIS — N644 Mastodynia: Secondary | ICD-10-CM

## 2019-10-14 ENCOUNTER — Ambulatory Visit
Admission: RE | Admit: 2019-10-14 | Discharge: 2019-10-14 | Disposition: A | Discharge status: Home / Self Care | Payer: BC Managed Care – PPO | Source: Ambulatory Visit | Attending: Obstetrics and Gynecology | Admitting: Obstetrics and Gynecology

## 2019-10-14 ENCOUNTER — Other Ambulatory Visit: Payer: Self-pay

## 2019-10-14 DIAGNOSIS — N644 Mastodynia: Secondary | ICD-10-CM

## 2019-10-14 DIAGNOSIS — R922 Inconclusive mammogram: Secondary | ICD-10-CM | POA: Diagnosis not present

## 2019-10-24 ENCOUNTER — Ambulatory Visit: Payer: BC Managed Care – PPO | Admitting: Family Medicine

## 2019-11-01 ENCOUNTER — Other Ambulatory Visit: Payer: Self-pay

## 2019-11-01 ENCOUNTER — Encounter: Payer: Self-pay | Admitting: Family Medicine

## 2019-11-01 ENCOUNTER — Ambulatory Visit: Payer: BC Managed Care – PPO | Admitting: Family Medicine

## 2019-11-01 VITALS — BP 118/70 | HR 63 | Temp 97.9°F | Ht 65.5 in | Wt 172.0 lb

## 2019-11-01 DIAGNOSIS — R221 Localized swelling, mass and lump, neck: Secondary | ICD-10-CM | POA: Diagnosis not present

## 2019-11-01 DIAGNOSIS — N951 Menopausal and female climacteric states: Secondary | ICD-10-CM | POA: Diagnosis not present

## 2019-11-01 DIAGNOSIS — J309 Allergic rhinitis, unspecified: Secondary | ICD-10-CM

## 2019-11-01 MED ORDER — FLUTICASONE PROPIONATE 50 MCG/ACT NA SUSP: 2.0000 | Freq: Every day | NASAL | 6 refills | Status: DC

## 2019-11-01 NOTE — Patient Instructions (Signed)
Good to see you today  I have sent in a nasal spray for you to use at bedtime, please start a daily antihistamine  I have ordered an ultrasound  If anything changes let me know  For sleep- try the extended release melatonin, 1 hour before bedtime   Perimenopause  Perimenopause is the normal time of life before and after menstrual periods stop completely (menopause). Perimenopause can begin 2-8 years before menopause, and it usually lasts for 1 year after menopause. During perimenopause, the ovaries may or may not produce an egg. What are the causes? This condition is caused by a natural change in hormone levels that happens as you get older. What increases the risk? This condition is more likely to start at an earlier age if you have certain medical conditions or treatments, including:  A tumor of the pituitary gland in the brain.  A disease that affects the ovaries and hormone production.  Radiation treatment for cancer.  Certain cancer treatments, such as chemotherapy or hormone (anti-estrogen) therapy.  Heavy smoking and excessive alcohol use.  Family history of early menopause. What are the signs or symptoms? Perimenopausal changes affect each woman differently. Symptoms of this condition may include:  Hot flashes.  Night sweats.  Irregular menstrual periods.  Decreased sex drive.  Vaginal dryness.  Headaches.  Mood swings.  Depression.  Memory problems or trouble concentrating.  Irritability.  Tiredness.  Weight gain.  Anxiety.  Trouble getting pregnant. How is this diagnosed? This condition is diagnosed based on your medical history, a physical exam, your age, your menstrual history, and your symptoms. Hormone tests may also be done. How is this treated? In some cases, no treatment is needed. You and your health care provider should make a decision together about whether treatment is necessary. Treatment will be based on your individual condition  and preferences. Various treatments are available, such as:  Menopausal hormone therapy (MHT).  Medicines to treat specific symptoms.  Acupuncture.  Vitamin or herbal supplements. Before starting treatment, make sure to let your health care provider know if you have a personal or family history of:  Heart disease.  Breast cancer.  Blood clots.  Diabetes.  Osteoporosis. Follow these instructions at home: Lifestyle  Do not use any products that contain nicotine or tobacco, such as cigarettes and e-cigarettes. If you need help quitting, ask your health care provider.  Eat a balanced diet that includes fresh fruits and vegetables, whole grains, soybeans, eggs, lean meat, and low-fat dairy.  Get at least 30 minutes of physical activity on 5 or more days each week.  Avoid alcoholic and caffeinated beverages, as well as spicy foods. This may help prevent hot flashes.  Get 7-8 hours of sleep each night.  Dress in layers that can be removed to help you manage hot flashes.  Find ways to manage stress, such as deep breathing, meditation, or journaling. General instructions  Keep track of your menstrual periods, including: ? When they occur. ? How heavy they are and how long they last. ? How much time passes between periods.  Keep track of your symptoms, noting when they start, how often you have them, and how long they last.  Take over-the-counter and prescription medicines only as told by your health care provider.  Take vitamin supplements only as told by your health care provider. These may include calcium, vitamin E, and vitamin D.  Use vaginal lubricants or moisturizers to help with vaginal dryness and improve comfort during sex.  Talk  with your health care provider before starting any herbal supplements.  Keep all follow-up visits as told by your health care provider. This is important. This includes any group therapy or counseling. Contact a health care provider  if:  You have heavy vaginal bleeding or pass blood clots.  Your period lasts more than 2 days longer than normal.  Your periods are recurring sooner than 21 days.  You bleed after having sex. Get help right away if:  You have chest pain, trouble breathing, or trouble talking.  You have severe depression.  You have pain when you urinate.  You have severe headaches.  You have vision problems. Summary  Perimenopause is the time when a woman's body begins to move into menopause. This may happen naturally or as a result of other health problems or medical treatments.  Perimenopause can begin 2-8 years before menopause, and it usually lasts for 1 year after menopause.  Perimenopausal symptoms can be managed through medicines, lifestyle changes, and complementary therapies such as acupuncture. This information is not intended to replace advice given to you by your health care provider. Make sure you discuss any questions you have with your health care provider. Document Revised: 02/03/2017 Document Reviewed: 03/29/2016 Elsevier Patient Education  2020 [employer].

## 2019-11-01 NOTE — Progress Notes (Signed)
   Subjective:    Patient ID: Tiffany Zamora, female    DOB: 1970/12/23, 49 y.o.   MRN: [id number]  HPI Chief Complaint  Patient presents with  . lump in throat    started 4wks.     Off and on x 3-4 weeks. Worse when she doesn't sleep well. Feels like her lymph nodes are swollen and pressing on her throat. Sometimes feels at base of throat, sometimes at sides of throat. Feels like someone is choking her.  Has sensation of postnasal drainage.  Peri menopause- having some vasomotor symptoms at night and irritability.  Some difficulty sleeping.  Has some extended release melatonin at home but has not tried it.  Occasionally takes diphenhydramine-containing medication with some improvement.  Review of Systems Per HPI    Objective:   Physical Exam Vitals reviewed.  Constitutional:      General: She is not in acute distress.    Appearance: Normal appearance. She is normal weight. She is not ill-appearing, toxic-appearing or diaphoretic.  HENT:     Head: Normocephalic and atraumatic.     Nose: Rhinorrhea present.     Right Turbinates: Swollen.     Left Turbinates: Swollen.     Mouth/Throat:     Mouth: Mucous membranes are moist.     Pharynx: Oropharynx is clear.  Neck:     Comments: ? Slightly full thyroid gland on palpation Cardiovascular:     Rate and Rhythm: Normal rate and regular rhythm.     Heart sounds: Normal heart sounds.  Pulmonary:     Effort: Pulmonary effort is normal.     Breath sounds: Normal breath sounds.  Musculoskeletal:     Cervical back: Normal range of motion and neck supple. No rigidity or tenderness.  Lymphadenopathy:     Cervical: No cervical adenopathy.  Neurological:     Mental Status: She is alert and oriented to person, place, and time.  Psychiatric:        Mood and Affect: Mood normal.        Behavior: Behavior normal.        Thought Content: Thought content normal.        Judgment: Judgment normal.       BP 118/70   Pulse 63   Temp  97.9 F (36.6 C) (Temporal)   Ht 5' 5.5" (1.664 m)   Wt 172 lb (78 kg)   LMP 10/23/2019 (Exact Date)   SpO2 97%   BMI 28.19 kg/m  Wt Readings from Last 3 Encounters:  11/01/19 172 lb (78 kg)  06/26/19 174 lb (78.9 kg)  05/23/19 174 lb 8 oz (79.2 kg)       Assessment & Plan:  1. Neck fullness - US THYROID; Future  2. Allergic rhinitis, unspecified seasonality, unspecified trigger - fluticasone (FLONASE) 50 MCG/ACT nasal spray; Place 2 sprays into both nostrils daily.  Dispense: 16 g; Refill: 6  3. Perimenopause -Provided written and verbal information regarding diagnosis and treatment. - she is currently on OCPs from gyn, discussed sleep, stress, diet  This visit occurred during the SARS-CoV-2 public health emergency.  Safety protocols were in place, including screening questions prior to the visit, additional usage of staff PPE, and extensive cleaning of exam room while observing appropriate contact time as indicated for disinfecting solutions.      Olean Ree, FNP-BC  [facility], [facility]  11/03/2019 8:12 AM

## 2019-11-03 ENCOUNTER — Encounter: Payer: Self-pay | Admitting: Family Medicine

## 2019-11-08 ENCOUNTER — Ambulatory Visit
Admission: RE | Admit: 2019-11-08 | Discharge: 2019-11-08 | Disposition: A | Discharge status: Home / Self Care | Payer: BC Managed Care – PPO | Source: Ambulatory Visit | Attending: Family Medicine | Admitting: Family Medicine

## 2019-11-08 ENCOUNTER — Other Ambulatory Visit: Payer: Self-pay

## 2019-11-08 DIAGNOSIS — R221 Localized swelling, mass and lump, neck: Secondary | ICD-10-CM | POA: Diagnosis not present

## 2019-11-28 DIAGNOSIS — Z1152 Encounter for screening for COVID-19: Secondary | ICD-10-CM | POA: Diagnosis not present

## 2019-11-28 DIAGNOSIS — Z03818 Encounter for observation for suspected exposure to other biological agents ruled out: Secondary | ICD-10-CM | POA: Diagnosis not present

## 2019-12-02 ENCOUNTER — Encounter: Payer: Self-pay | Admitting: Family Medicine

## 2019-12-11 ENCOUNTER — Encounter: Payer: Self-pay | Admitting: Family Medicine

## 2019-12-11 NOTE — Telephone Encounter (Signed)
FYI to referral coordinator with update regarding Tiffany Zamora - weight is stable per different mychart message

## 2019-12-21 ENCOUNTER — Encounter: Payer: Self-pay | Admitting: Family Medicine

## 2019-12-24 ENCOUNTER — Encounter: Payer: Self-pay | Admitting: Family Medicine

## 2020-01-20 DIAGNOSIS — Z01419 Encounter for gynecological examination (general) (routine) without abnormal findings: Secondary | ICD-10-CM | POA: Diagnosis not present

## 2020-03-05 ENCOUNTER — Encounter: Payer: Self-pay | Admitting: Family Medicine

## 2020-03-11 NOTE — Telephone Encounter (Signed)
Called to discuss and Saveah stated she was able to get access to daughter's mychart.

## 2020-03-14 DIAGNOSIS — Z1152 Encounter for screening for COVID-19: Secondary | ICD-10-CM | POA: Diagnosis not present

## 2020-04-03 DIAGNOSIS — Z20822 Contact with and (suspected) exposure to covid-19: Secondary | ICD-10-CM | POA: Diagnosis not present

## 2020-04-03 DIAGNOSIS — Z03818 Encounter for observation for suspected exposure to other biological agents ruled out: Secondary | ICD-10-CM | POA: Diagnosis not present

## 2020-04-15 ENCOUNTER — Telehealth: Payer: Self-pay

## 2020-04-15 NOTE — Telephone Encounter (Signed)
Pt already has appt scheduled to see Dr Selena Batten on 04/21/20 at 12 noon with Dr Selena Batten. Unable to reach pt x 2 on phone. Sending note to DR cody and [facility] CMA.

## 2020-04-15 NOTE — Telephone Encounter (Signed)
[facility] Day - Client TELEPHONE ADVICE RECORD AccessNurse Patient Name: Tiffany Zamora Gender: Female DOB: [birth year] Age: 50 Y 2 M 4 D Return Phone Number: [phone] (Primary) [place] [address] Client [facility] Day - Client Client Site [facility] - Day Physician Gweneth Dimitri- MD Contact Type Call Who Is Calling Patient / Member / Family / Caregiver Call Type Triage / Clinical Relationship To Patient Self Return Phone Number [phone] (Primary) Chief Complaint Blood In Stool Reason for Call Symptomatic / Request for Health Information Initial Comment Caller states bright red blood after wiping. Translation No Nurse Assessment Nurse: Annye English, RN, Denise Date/Time (Eastern Time): 04/15/2020 1:24:28 PM Confirm and document reason for call. If symptomatic, describe symptoms. ---Caller states red blood after wiping. States rectal bleeding. Does the patient have any new or worsening symptoms? ---Yes Will a triage be completed? ---Yes Related visit to physician within the last 2 weeks? ---No Does the PT have any chronic conditions? (i.e. diabetes, asthma, this includes High risk factors for pregnancy, etc.) ---No Is the patient pregnant or possibly pregnant? (Ask all females between the ages of 61-55) ---No Is this a behavioral health or substance abuse call? ---No Guidelines Guideline Title Affirmed Question Affirmed Notes Nurse Date/Time (Eastern Time) Rectal Bleeding Age > 50 years Annye English, RN, Angelique Blonder 04/15/2020 1:25:10 PM Disp. Time Lamount Cohen Time) Disposition Final User 04/15/2020 1:28:24 PM See PCP within 2 Weeks Yes Carmon, RN, Leighton Ruff Disagree/Comply Comply Caller Understands Yes PreDisposition Call Doctor Care Advice Given Per Guideline PLEASE NOTE: All timestamps contained within this report are represented as [place] Standard Time. CONFIDENTIALTY NOTICE: This fax  transmission is intended only for the addressee. It contains information that is legally privileged, confidential or otherwise protected from use or disclosure. If you are not the intended recipient, you are strictly prohibited from reviewing, disclosing, copying using or disseminating any of this information or taking any action in reliance on or regarding this information. If you have received this fax in error, please notify us immediately by telephone so that we can arrange for its return to [place]. Phone: [phone], Toll-Free: [phone], Fax: [phone] Page: 2 of 2 Call Id: [id number] Care Advice Given Per Guideline SEE PCP WITHIN 2 WEEKS: REASSURANCE AND EDUCATION - MILD RECTAL BLEEDING: * You have told me that there is only mild bleeding. * Often this can be caused by either hemorrhoids or a small tear (fissure) in the skin of the anus (rectal opening). * Here is some care advice that should help. WARM SALINE SITZ BATHS - FOR RECTAL SYMPTOMS: * Sit in a warm sitz bath for 20 minutes twice a day. * This will decrease swelling and irritation, keep the area clean, and help with healing. TO SOFTEN STOOLS AND TREAT CONSTIPATION: * Eat a high fiber diet. * Drink adequate liquids (6-8 glasses of water a day) CALL BACK IF: * Bleeding increases in amount * Bleeding occurs 3 or more times after treatment begins * You become worse CARE ADVICE given per Rectal Bleeding (Adult) guideline. Referrals REFERRED TO PCP OFFICE

## 2020-04-16 NOTE — Telephone Encounter (Signed)
Noted will see next week 

## 2020-04-21 ENCOUNTER — Other Ambulatory Visit: Payer: Self-pay

## 2020-04-21 ENCOUNTER — Ambulatory Visit: Payer: BC Managed Care – PPO | Admitting: Family Medicine

## 2020-04-21 VITALS — BP 102/76 | HR 71 | Temp 98.6°F | Resp 14 | Ht 65.5 in | Wt 173.5 lb

## 2020-04-21 DIAGNOSIS — K644 Residual hemorrhoidal skin tags: Secondary | ICD-10-CM | POA: Insufficient documentation

## 2020-04-21 DIAGNOSIS — K5900 Constipation, unspecified: Secondary | ICD-10-CM | POA: Insufficient documentation

## 2020-04-21 DIAGNOSIS — Z1211 Encounter for screening for malignant neoplasm of colon: Secondary | ICD-10-CM

## 2020-04-21 NOTE — Patient Instructions (Addendum)
You have a Hemorrhoid  Here are ways to prevent future issues and to treat your hemorrhoid 1) Make sure you get 25-35 g of INSOLUBLE fiber a day -- ideally from diet but you can also take a fiber supplement like Metamucil 2) Drink 1.5 to 2 liters of water daily  3) Avoid straining and prolonged periods on the toilet 4) Limit fatty foods and alcohol 5) Try to lose weight  How to care for your hemorrhoid now 1) Sitz Baths and warm water sprays 2) Wipe with a wet wipe or flushable wipe  3) Consider using stool softeners (Like Colace or Miralax) 4) Topical Hemorrhoid cream (Preparation H is available over the counter)  5) Steroid suppository - though can be expensive    #Referral I have placed a referral to a specialist for you. You should receive a phone call from the specialty office. Make sure your voicemail is not full and that if you are able to answer your phone to unknown or new numbers.   It may take up to 2 weeks to hear about the referral. If you do not hear anything in 2 weeks, please call our office and ask to speak with the referral coordinator.

## 2020-04-21 NOTE — Progress Notes (Signed)
Subjective:     Tiffany Zamora is a 50 y.o. female presenting for Blood after bowel movement (Last week noticed blood on toilet paper after bowel movement. Only noticed once. No pain, itching or burning in the rectal area. States her stomach has felt "weird" but could not describe the sensation. )     HPI  #Blood on toilet paper - last week after bm - x 1 - no pain, itching burning - stomach upset - x 1 week, a little crampy - more stress with daughter and family in general - will occasionally get loose stools and may have the cramping stomach issues leading up to this - trying to improve her diet - thinks it may be related to this   Review of Systems  Gastrointestinal: Positive for blood in stool and diarrhea (on friday). Negative for constipation, nausea and vomiting.     Social History   Tobacco Use  Smoking Status Never Smoker  Smokeless Tobacco Never Used        Objective:    BP Readings from Last 3 Encounters:  04/21/20 102/76  11/01/19 118/70  06/26/19 112/64   Wt Readings from Last 3 Encounters:  04/21/20 173 lb 8 oz (78.7 kg)  11/01/19 172 lb (78 kg)  06/26/19 174 lb (78.9 kg)    BP 102/76   Pulse 71   Temp 98.6 F (37 C)   Resp 14   Ht 5' 5.5" (1.664 m)   Wt 173 lb 8 oz (78.7 kg)   SpO2 99%   BMI 28.43 kg/m    Physical Exam Exam conducted with a chaperone present.  Constitutional:      General: She is not in acute distress.    Appearance: She is well-developed. She is not diaphoretic.  HENT:     Right Ear: External ear normal.     Left Ear: External ear normal.     Nose: Nose normal.  Eyes:     Conjunctiva/sclera: Conjunctivae normal.  Cardiovascular:     Rate and Rhythm: Normal rate.  Pulmonary:     Effort: Pulmonary effort is normal.  Abdominal:     General: Abdomen is flat. Bowel sounds are normal. There is no distension.     Palpations: Abdomen is soft.     Tenderness: There is no abdominal tenderness. There is no  guarding or rebound.  Genitourinary:    Rectum: External hemorrhoid present. No tenderness, anal fissure or internal hemorrhoid.  Musculoskeletal:     Cervical back: Neck supple.  Skin:    General: Skin is warm and dry.     Capillary Refill: Capillary refill takes less than 2 seconds.  Neurological:     Mental Status: She is alert. Mental status is at baseline.  Psychiatric:        Mood and Affect: Mood normal.        Behavior: Behavior normal.           Assessment & Plan:   Problem List Items Addressed This Visit      Cardiovascular and Mediastinum   External hemorrhoids - Primary    Discussed that hemorrhoid (not currently symptomatic) was the source of bleeding. Hand out for treatment if symptomatic.         Other   Constipation    She notes occasional loose stool, but also straining. Discussed adding fiber and avoiding foods that trigger the diarrhea/stomach upset.        Other Visit Diagnoses    Colon cancer  screening       Relevant Orders   Ambulatory referral to Gastroenterology       Return in about 6 months (around 10/19/2020) for annual wellness.  Lynnda Child, MD  This visit occurred during the SARS-CoV-2 public health emergency.  Safety protocols were in place, including screening questions prior to the visit, additional usage of staff PPE, and extensive cleaning of exam room while observing appropriate contact time as indicated for disinfecting solutions.

## 2020-04-21 NOTE — Assessment & Plan Note (Signed)
Discussed that hemorrhoid (not currently symptomatic) was the source of bleeding. Hand out for treatment if symptomatic.

## 2020-04-21 NOTE — Assessment & Plan Note (Signed)
She notes occasional loose stool, but also straining. Discussed adding fiber and avoiding foods that trigger the diarrhea/stomach upset.

## 2020-04-27 ENCOUNTER — Other Ambulatory Visit: Payer: Self-pay

## 2020-04-27 ENCOUNTER — Telehealth (INDEPENDENT_AMBULATORY_CARE_PROVIDER_SITE_OTHER): Payer: Self-pay | Admitting: Gastroenterology

## 2020-04-27 DIAGNOSIS — Z1211 Encounter for screening for malignant neoplasm of colon: Secondary | ICD-10-CM

## 2020-04-27 MED ORDER — PEG 3350-KCL-NA BICARB-NACL 420 G PO SOLR: 4000.0000 mL | Freq: Once | ORAL | 0 refills | Status: AC

## 2020-04-27 NOTE — Progress Notes (Signed)
Gastroenterology Pre-Procedure Review  Request Date: Monday 05/18/20 Requesting Physician: Dr. Allegra Lai  PATIENT REVIEW QUESTIONS: The patient responded to the following health history questions as indicated:    1. Are you having any GI issues? yes (Pt states she has 1 hemorrhoid) 2. Do you have a personal history of Polyps? no 3. Do you have a family history of Colon Cancer or Polyps? no 4. Diabetes Mellitus? no 5. Joint replacements in the past 12 months?no 6. Major health problems in the past 3 months?no 7. Any artificial heart valves, MVP, or defibrillator?no    MEDICATIONS & ALLERGIES:    Patient reports the following regarding taking any anticoagulation/antiplatelet therapy:   Plavix, Coumadin, Eliquis, Xarelto, Lovenox, Pradaxa, Brilinta, or Effient? no Aspirin? no  Patient confirms/reports the following medications:  Current Outpatient Medications  Medication Sig Dispense Refill  . fexofenadine (ALLEGRA) 180 MG tablet Take 180 mg by mouth daily.    . fluticasone (FLONASE) 50 MCG/ACT nasal spray Place 2 sprays into both nostrils daily. 16 g 6  . levonorgestrel-ethinyl estradiol (ALESSE) 0.1-20 MG-MCG tablet Take 1 tablet by mouth daily.    . Magnesium 300 MG CAPS 1 capsule with a meal     No current facility-administered medications for this visit.    Patient confirms/reports the following allergies:  No Known Allergies  No orders of the defined types were placed in this encounter.   AUTHORIZATION INFORMATION Primary Insurance: 1D#: Group #:  Secondary Insurance: 1D#: Group #:  SCHEDULE INFORMATION: Date: Monday 05/18/20 Time: Location:[facility]

## 2020-05-14 ENCOUNTER — Other Ambulatory Visit: Payer: Self-pay

## 2020-05-14 ENCOUNTER — Other Ambulatory Visit
Admission: RE | Admit: 2020-05-14 | Discharge: 2020-05-14 | Disposition: A | Discharge status: Home / Self Care | Payer: BC Managed Care – PPO | Source: Ambulatory Visit | Attending: Gastroenterology | Admitting: Gastroenterology

## 2020-05-14 DIAGNOSIS — Z01812 Encounter for preprocedural laboratory examination: Secondary | ICD-10-CM | POA: Diagnosis not present

## 2020-05-14 DIAGNOSIS — Z20822 Contact with and (suspected) exposure to covid-19: Secondary | ICD-10-CM | POA: Diagnosis not present

## 2020-05-14 LAB — SARS CORONAVIRUS 2 (TAT 6-24 HRS): SARS Coronavirus 2: NEGATIVE

## 2020-05-18 ENCOUNTER — Ambulatory Visit: Payer: BC Managed Care – PPO | Admitting: Certified Registered"

## 2020-05-18 ENCOUNTER — Encounter: Payer: Self-pay | Admitting: Gastroenterology

## 2020-05-18 ENCOUNTER — Encounter
Admission: RE | Disposition: A | Discharge status: Home / Self Care | Payer: Self-pay | Source: Home / Self Care | Attending: Gastroenterology

## 2020-05-18 ENCOUNTER — Ambulatory Visit
Admission: RE | Admit: 2020-05-18 | Discharge: 2020-05-18 | Disposition: A | Discharge status: Home / Self Care | Payer: BC Managed Care – PPO | Attending: Gastroenterology | Admitting: Gastroenterology

## 2020-05-18 ENCOUNTER — Other Ambulatory Visit: Payer: Self-pay

## 2020-05-18 DIAGNOSIS — Z79899 Other long term (current) drug therapy: Secondary | ICD-10-CM | POA: Diagnosis not present

## 2020-05-18 DIAGNOSIS — Z793 Long term (current) use of hormonal contraceptives: Secondary | ICD-10-CM | POA: Insufficient documentation

## 2020-05-18 DIAGNOSIS — Z1211 Encounter for screening for malignant neoplasm of colon: Secondary | ICD-10-CM | POA: Diagnosis not present

## 2020-05-18 HISTORY — PX: COLONOSCOPY WITH PROPOFOL: SHX5780

## 2020-05-18 LAB — POCT PREGNANCY, URINE: Preg Test, Ur: NEGATIVE

## 2020-05-18 SURGERY — COLONOSCOPY WITH PROPOFOL
Anesthesia: General

## 2020-05-18 MED ORDER — PROPOFOL 500 MG/50ML IV EMUL
INTRAVENOUS | Status: DC | PRN
  Administered 2020-05-18: 150 ug/kg/min via INTRAVENOUS

## 2020-05-18 MED ORDER — DEXMEDETOMIDINE (PRECEDEX) IN NS 20 MCG/5ML (4 MCG/ML) IV SYRINGE
PREFILLED_SYRINGE | INTRAVENOUS | Status: AC
  Filled 2020-05-18: qty 5

## 2020-05-18 MED ORDER — SODIUM CHLORIDE 0.9 % IV SOLN: INTRAVENOUS | Status: DC

## 2020-05-18 MED ORDER — PROPOFOL 500 MG/50ML IV EMUL
INTRAVENOUS | Status: AC
  Filled 2020-05-18: qty 50

## 2020-05-18 MED ORDER — DEXMEDETOMIDINE (PRECEDEX) IN NS 20 MCG/5ML (4 MCG/ML) IV SYRINGE
PREFILLED_SYRINGE | INTRAVENOUS | Status: DC | PRN
  Administered 2020-05-18: 12 ug via INTRAVENOUS

## 2020-05-18 MED ORDER — LIDOCAINE HCL (CARDIAC) PF 100 MG/5ML IV SOSY
PREFILLED_SYRINGE | INTRAVENOUS | Status: DC | PRN
  Administered 2020-05-18: 50 mg via INTRAVENOUS

## 2020-05-18 MED ORDER — PROPOFOL 10 MG/ML IV BOLUS
INTRAVENOUS | Status: DC | PRN
  Administered 2020-05-18: 70 mg via INTRAVENOUS
  Administered 2020-05-18: 30 mg via INTRAVENOUS

## 2020-05-18 MED ORDER — LIDOCAINE HCL (PF) 2 % IJ SOLN
INTRAMUSCULAR | Status: AC
  Filled 2020-05-18: qty 5

## 2020-05-18 MED ORDER — MIDAZOLAM HCL 2 MG/2ML IJ SOLN
INTRAMUSCULAR | Status: AC
  Filled 2020-05-18: qty 2

## 2020-05-18 MED ORDER — MIDAZOLAM HCL 2 MG/2ML IJ SOLN
INTRAMUSCULAR | Status: DC | PRN
  Administered 2020-05-18: 2 mg via INTRAVENOUS

## 2020-05-18 NOTE — Transfer of Care (Signed)
Immediate Anesthesia Transfer of Care Note  Patient: Tiffany Zamora  Procedure(s) Performed: COLONOSCOPY WITH PROPOFOL (N/A )  Patient Location: Endoscopy Unit  Anesthesia Type:General  Level of Consciousness: drowsy  Airway & Oxygen Therapy: Patient Spontanous Breathing  Post-op Assessment: Report given to RN and Post -op Vital signs reviewed and stable  Post vital signs: Reviewed and stable  Last Vitals:  Vitals Value Taken Time  BP 96/56 05/18/20 1015  Temp 36.2 C 05/18/20 1015  Pulse 63 05/18/20 1017  Resp 21 05/18/20 1017  SpO2 98 % 05/18/20 1017  Vitals shown include unvalidated device data.  Last Pain:  Vitals:   05/18/20 1015  TempSrc: Temporal  PainSc: Asleep         Complications: No complications documented.

## 2020-05-18 NOTE — Anesthesia Procedure Notes (Signed)
Procedure Name: MAC Date/Time: 05/18/2020 9:48 AM Performed by: Jerrye Noble, CRNA Pre-anesthesia Checklist: Patient identified, Emergency Drugs available, Suction available and Patient being monitored Patient Re-evaluated:Patient Re-evaluated prior to induction

## 2020-05-18 NOTE — Anesthesia Postprocedure Evaluation (Signed)
Anesthesia Post Note  Patient: Claude Swendsen  Procedure(s) Performed: COLONOSCOPY WITH PROPOFOL (N/A )  Patient location during evaluation: Endoscopy Anesthesia Type: General Level of consciousness: awake and alert Pain management: pain level controlled Vital Signs Assessment: post-procedure vital signs reviewed and stable Respiratory status: spontaneous breathing, nonlabored ventilation, respiratory function stable and patient connected to nasal cannula oxygen Cardiovascular status: blood pressure returned to baseline and stable Postop Assessment: no apparent nausea or vomiting Anesthetic complications: no   No complications documented.   Last Vitals:  Vitals:   05/18/20 0907 05/18/20 1015  BP: 131/80 (!) 96/56  Pulse: 61   Resp: 20   Temp: (!) 36.4 C (!) 36.2 C  SpO2: 100%     Last Pain:  Vitals:   05/18/20 1045  TempSrc:   PainSc: 0-No pain                 Cleda Mccreedy Cassondra Stachowski

## 2020-05-18 NOTE — Anesthesia Preprocedure Evaluation (Signed)
Anesthesia Evaluation  Patient identified by MRN, date of birth, ID band Patient awake    Reviewed: Allergy & Precautions, H&P , NPO status , Patient's Chart, lab work & pertinent test results  Airway Mallampati: II  TM Distance: >3 FB Neck ROM: full    Dental  (+) Chipped   Pulmonary asthma ,    Pulmonary exam normal        Cardiovascular Exercise Tolerance: Good (-) angina(-) Past MI and (-) DOE negative cardio ROS Normal cardiovascular exam     Neuro/Psych negative neurological ROS  negative psych ROS   GI/Hepatic negative GI ROS, Neg liver ROS, neg GERD  ,  Endo/Other  negative endocrine ROS  Renal/GU negative Renal ROS  negative genitourinary   Musculoskeletal   Abdominal   Peds  Hematology negative hematology ROS (+)   Anesthesia Other Findings Past Medical History: ~2005: Benign hematuria     Comment:  per pt normal workup with cystoscopy [facility]) No date: Heartburn 09/25/2015: IBS (irritable bowel syndrome) No date: Seasonal allergies No date: Urine incontinence  Past Surgical History: 2000: COLONOSCOPY     Comment:  for IBS sxs, WNL per patient 08/2007: DILATION AND CURETTAGE OF UTERUS 1993: WISDOM TOOTH EXTRACTION  BMI    Body Mass Index: 27.44 kg/m      Reproductive/Obstetrics negative OB ROS                             Anesthesia Physical Anesthesia Plan  ASA: II  Anesthesia Plan: General   Post-op Pain Management:    Induction: Intravenous  PONV Risk Score and Plan: Propofol infusion and TIVA  Airway Management Planned: Natural Airway and Nasal Cannula  Additional Equipment:   Intra-op Plan:   Post-operative Plan:   Informed Consent: I have reviewed the patients History and Physical, chart, labs and discussed the procedure including the risks, benefits and alternatives for the proposed anesthesia with the patient or authorized representative who has  indicated his/her understanding and acceptance.     Dental Advisory Given  Plan Discussed with: Anesthesiologist, CRNA and Surgeon  Anesthesia Plan Comments: (Patient consented for risks of anesthesia including but not limited to:  - adverse reactions to medications - risk of airway placement if required - damage to eyes, teeth, lips or other oral mucosa - nerve damage due to positioning  - sore throat or hoarseness - Damage to heart, brain, nerves, lungs, other parts of body or loss of life  Patient voiced understanding.)        Anesthesia Quick Evaluation

## 2020-05-18 NOTE — H&P (Signed)
Arlyss Repress, MD [address]  [address]  [place], [place] [address]  Main: [phone]  Fax: [phone] Pager: [phone]  Primary Care Physician:  Lynnda Child, MD Primary Gastroenterologist:  Dr. Arlyss Repress  Pre-Procedure History & Physical: HPI:  Tiffany Zamora is a 50 y.o. female is here for an colonoscopy.   Past Medical History:  Diagnosis Date  . Benign hematuria ~2005   per pt normal workup with cystoscopy [facility])  . Heartburn   . IBS (irritable bowel syndrome) 09/25/2015  . Seasonal allergies   . Urine incontinence     Past Surgical History:  Procedure Laterality Date  . COLONOSCOPY  2000   for IBS sxs, WNL per patient  . DILATION AND CURETTAGE OF UTERUS  08/2007  . WISDOM TOOTH EXTRACTION  1993    Prior to Admission medications   Medication Sig Start Date End Date Taking? Authorizing Provider  fexofenadine (ALLEGRA) 180 MG tablet Take 180 mg by mouth daily.   Yes [provider]  fluticasone (FLONASE) 50 MCG/ACT nasal spray Place 2 sprays into both nostrils daily. 11/01/19  Yes Emi Belfast, FNP  levonorgestrel-ethinyl estradiol (ALESSE) 0.1-20 MG-MCG tablet Take 1 tablet by mouth daily.   Yes [provider]  Magnesium 300 MG CAPS 1 capsule with a meal   Yes [provider]    Allergies as of 04/27/2020  . (No Known Allergies)    Family History  Problem Relation Age of Onset  . Diabetes Sister   . Heart attack Maternal Grandfather 61       MI  . Hypertension Mother   . Tongue cancer Mother        prior smoker  . Hypertension Father   . Dementia Maternal Grandmother 95       died at 23  . Cancer Neg Hx   . Stroke Neg Hx     Social History   Socioeconomic History  . Marital status: Married    Spouse name: Alycia Rossetti  . Number of children: 1  . Years of education: Masters  . Highest education level: Not on file  Occupational History  . Not on file  Tobacco Use  . Smoking status: Never  Smoker  . Smokeless tobacco: Never Used  Vaping Use  . Vaping Use: Never used  Substance and Sexual Activity  . Alcohol use: Yes    Alcohol/week: 0.0 standard drinks    Comment: a few times a week, 2 glasses  . Drug use: No  . Sexual activity: Yes    Birth control/protection: Pill  Other Topics Concern  . 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      Family: daughter Danne Harbor (2007), in-laws nearby with a good relationship      Enjoys: hiking, going to the gym, biking, spend time with friends, photography things      Activity: exercises regularly 3-5x/wk   Diet: not so good, trying to change that      Safety   Seat belts: Yes    Guns: Yes  and secure   Safe in relationships: Yes  Social Determinants of Health   Financial Resource Strain: Not on file  Food Insecurity: Not on file  Transportation Needs: Not on file  Physical Activity: Not on file  Stress: Not on file  Social Connections: Not on file  Intimate Partner Violence: Not on file    Review of Systems: See HPI, otherwise negative ROS  Physical Exam: BP 131/80 (BP Location: Left Arm)   Pulse 61   Temp (!) 97.5 F (36.4 C) (Temporal)   Resp 20   Ht 5\' 6"  (1.676 m)   Wt 77.1 kg   SpO2 100%   BMI 27.44 kg/m  General:   Alert,  pleasant and cooperative in NAD Head:  Normocephalic and atraumatic. Neck:  Supple; no masses or thyromegaly. Lungs:  Clear throughout to auscultation.    Heart:  Regular rate and rhythm. Abdomen:  Soft, nontender and nondistended. Normal bowel sounds, without guarding, and without rebound.   Neurologic:  Alert and  oriented x4;  grossly normal neurologically.  Impression/Plan: Tiffany Zamora is here for an colonoscopy to be performed for colon cancer screening  Risks, benefits, limitations, and alternatives regarding  colonoscopy have been reviewed with the patient.  Questions have been answered.  All parties agreeable.   [name], MD  05/18/2020, 9:35 AM

## 2020-05-18 NOTE — Op Note (Signed)
[facility] Gastroenterology Patient Name: Tiffany Zamora Procedure Date: 05/18/2020 9:40 AM MRN: [id number] Account #: [id number] Date of Birth: [birth year] Admit Type: Outpatient Age: 50 Room: [facility] ENDO ROOM 1 Gender: Female Note Status: Finalized Procedure:             Colonoscopy Indications:           Screening for colorectal malignant neoplasm Providers:             Toney Reil MD, MD Referring MD:          Chryl Heck. Selena Batten (Referring MD) Medicines:             General Anesthesia Complications:         No immediate complications. Estimated blood loss: None. Procedure:             Pre-Anesthesia Assessment:                        - Prior to the procedure, a History and Physical was                         performed, and patient medications and allergies were                         reviewed. The patient is competent. The risks and                         benefits of the procedure and the sedation options and                         risks were discussed with the patient. All questions                         were answered and informed consent was obtained.                         Patient identification and proposed procedure were                         verified by the physician, the nurse, the                         anesthesiologist, the anesthetist and the technician                         in the pre-procedure area in the procedure room in the                         endoscopy suite. Mental Status Examination: alert and                         oriented. Airway Examination: normal oropharyngeal                         airway and neck mobility. Respiratory Examination:                         clear to auscultation. CV Examination: normal.  Prophylactic Antibiotics: The patient does not require                         prophylactic antibiotics. Prior Anticoagulants: The                         patient has taken no previous anticoagulant  or                         antiplatelet agents. ASA Grade Assessment: II - A                         patient with mild systemic disease. After reviewing                         the risks and benefits, the patient was deemed in                         satisfactory condition to undergo the procedure. The                         anesthesia plan was to use general anesthesia.                         Immediately prior to administration of medications,                         the patient was re-assessed for adequacy to receive                         sedatives. The heart rate, respiratory rate, oxygen                         saturations, blood pressure, adequacy of pulmonary                         ventilation, and response to care were monitored                         throughout the procedure. The physical status of the                         patient was re-assessed after the procedure.                        After obtaining informed consent, the colonoscope was                         passed under direct vision. Throughout the procedure,                         the patient's blood pressure, pulse, and oxygen                         saturations were monitored continuously. The                         Colonoscope was introduced through the anus and  advanced to the the cecum, identified by appendiceal                         orifice and ileocecal valve. The colonoscopy was                         performed with difficulty due to significant looping.                         Successful completion of the procedure was aided by                         applying abdominal pressure. The patient tolerated the                         procedure well. The quality of the bowel preparation                         was evaluated using the BBPS [facility] Bowel Preparation                         Scale) with scores of: Right Colon = 3, Transverse                         Colon = 3 and Left  Colon = 3 (entire mucosa seen well                         with no residual staining, small fragments of stool or                         opaque liquid). The total BBPS score equals 9. Findings:      The perianal and digital rectal examinations were normal. Pertinent       negatives include normal sphincter tone and no palpable rectal lesions.      The entire examined colon appeared normal. Normal rectum on forward       view, Unable to perform retroflexion despite several attempts Impression:            - The entire examined colon is normal.                        - The distal rectum and anal verge are normal on                         retroflexion view.                        - No specimens collected. Recommendation:        - Discharge patient to home (with escort).                        - Resume previous diet today.                        - Continue present medications.                        - Repeat colonoscopy in 10 years for screening  purposes. Procedure Code(s):     --- Professional ---                        W2376, Colorectal cancer screening; colonoscopy on                         individual not meeting criteria for high risk Diagnosis Code(s):     --- Professional ---                        Z12.11, Encounter for screening for malignant neoplasm                         of colon CPT copyright 2019 American Medical Association. All rights reserved. The codes documented in this report are preliminary and upon coder review may  be revised to meet current compliance requirements. Dr. Libby Maw Toney Reil MD, MD 05/18/2020 10:14:00 AM This report has been signed electronically. Number of Addenda: 0 Note Initiated On: 05/18/2020 9:40 AM Scope Withdrawal Time: 0 hours 11 minutes 31 seconds  Total Procedure Duration: 0 hours 17 minutes 32 seconds  Estimated Blood Loss:  Estimated blood loss: none.      [facility]

## 2020-05-19 ENCOUNTER — Encounter: Payer: Self-pay | Admitting: Gastroenterology

## 2020-07-20 DIAGNOSIS — Z03818 Encounter for observation for suspected exposure to other biological agents ruled out: Secondary | ICD-10-CM | POA: Diagnosis not present

## 2020-07-20 DIAGNOSIS — Z20822 Contact with and (suspected) exposure to covid-19: Secondary | ICD-10-CM | POA: Diagnosis not present

## 2020-09-06 IMAGING — MG DIGITAL DIAGNOSTIC BILATERAL MAMMOGRAM WITH TOMO AND CAD
6 of 12 series · 6 of 36 positions shown · non-contrast
Comparison: 05/08/2017 and earlier

CLINICAL DATA: Patient has focal tenderness in the OUTER portions
of both breasts. Patient also reports that something "does not feel
RIGHT" at the area of focal tenderness in the LOWER OUTER QUADRANT
of the RIGHT breast. She noted a pulling sensation in this region
during a recent work out.

EXAM:
DIGITAL DIAGNOSTIC BILATERAL MAMMOGRAM WITH CAD AND TOMO
ULTRASOUND BILATERAL BREAST

[R CC synth-2D]
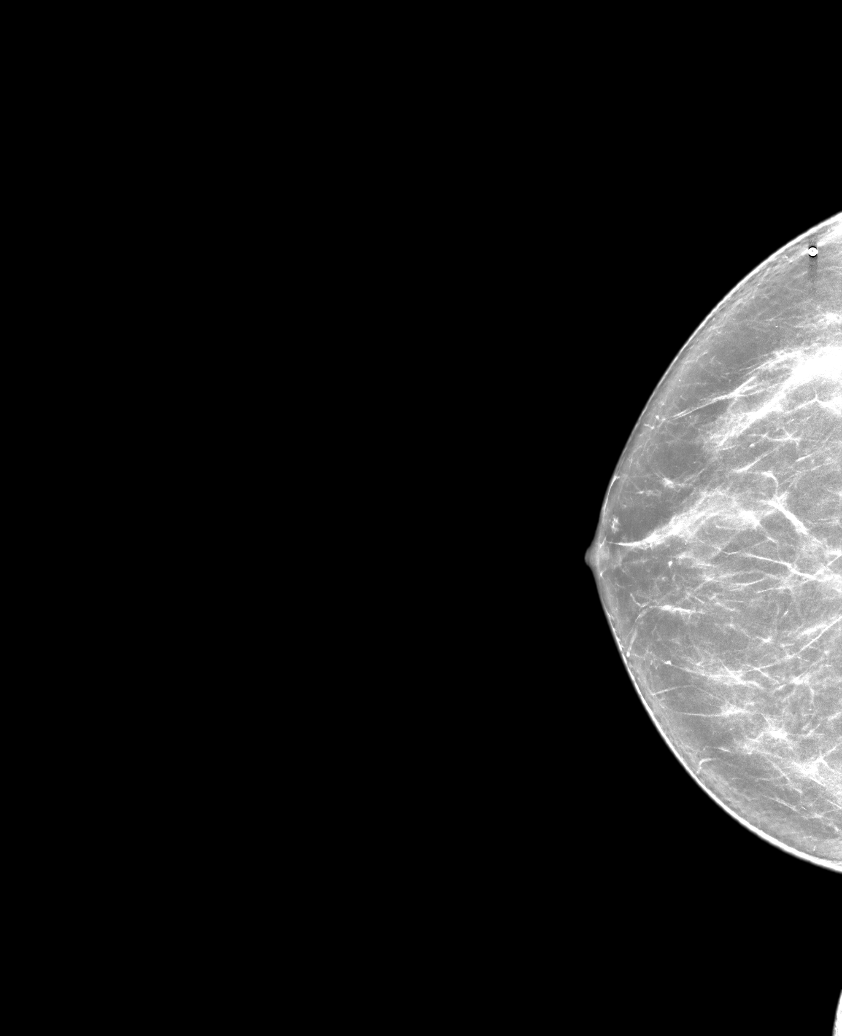

[L TAN synth-2D]
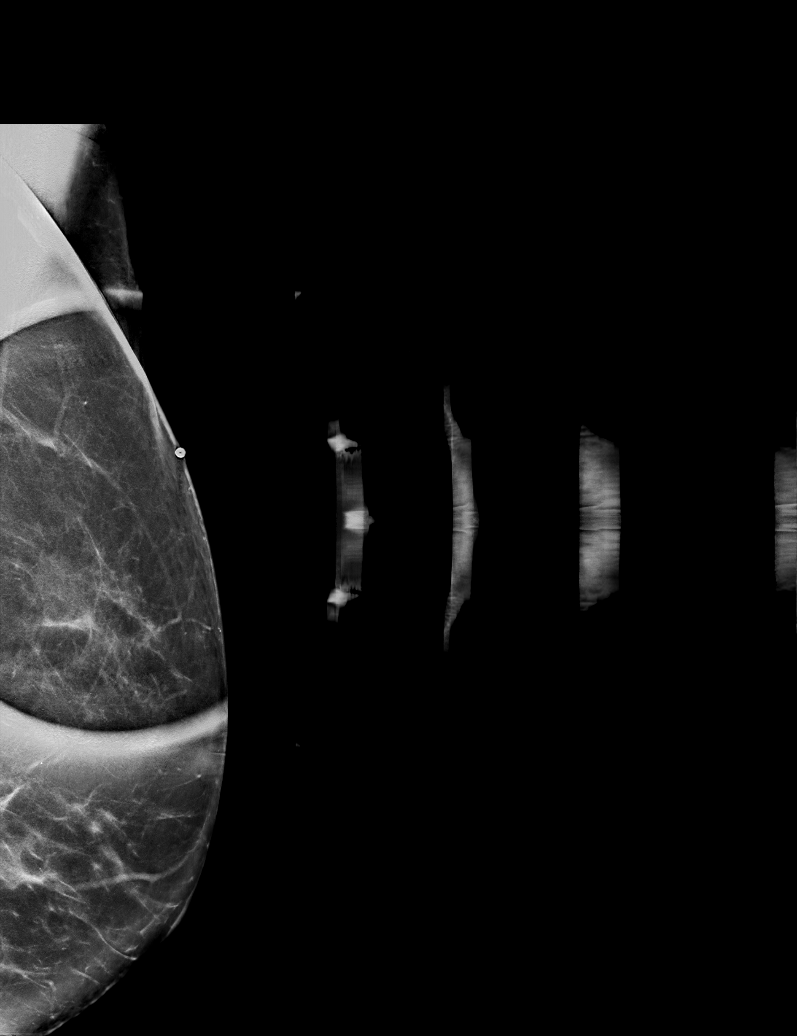

[R TAN synth-2D]
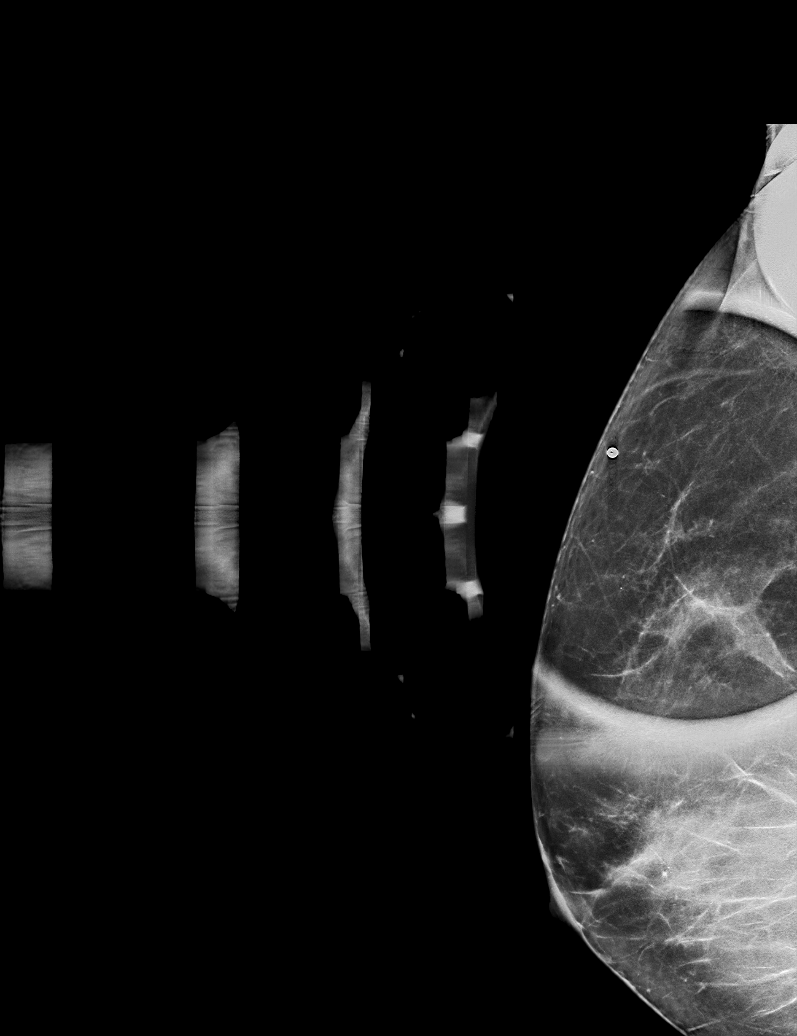

[L CC synth-2D]
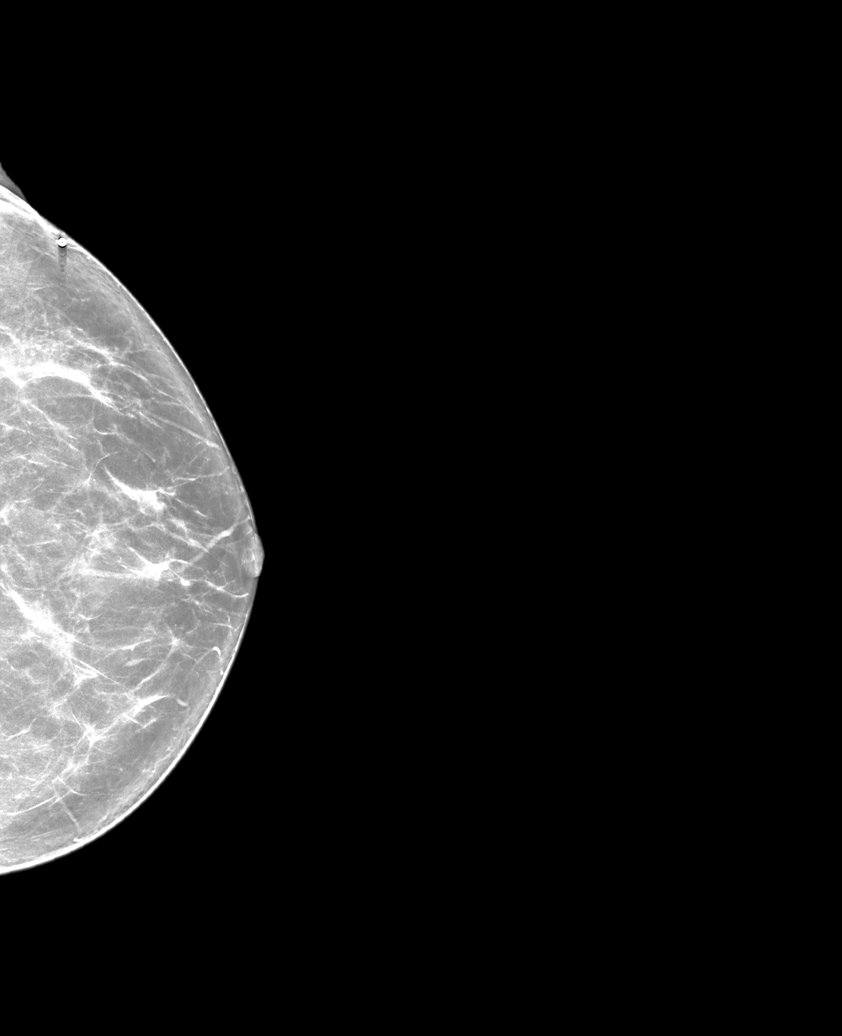

[L MLO synth-2D]
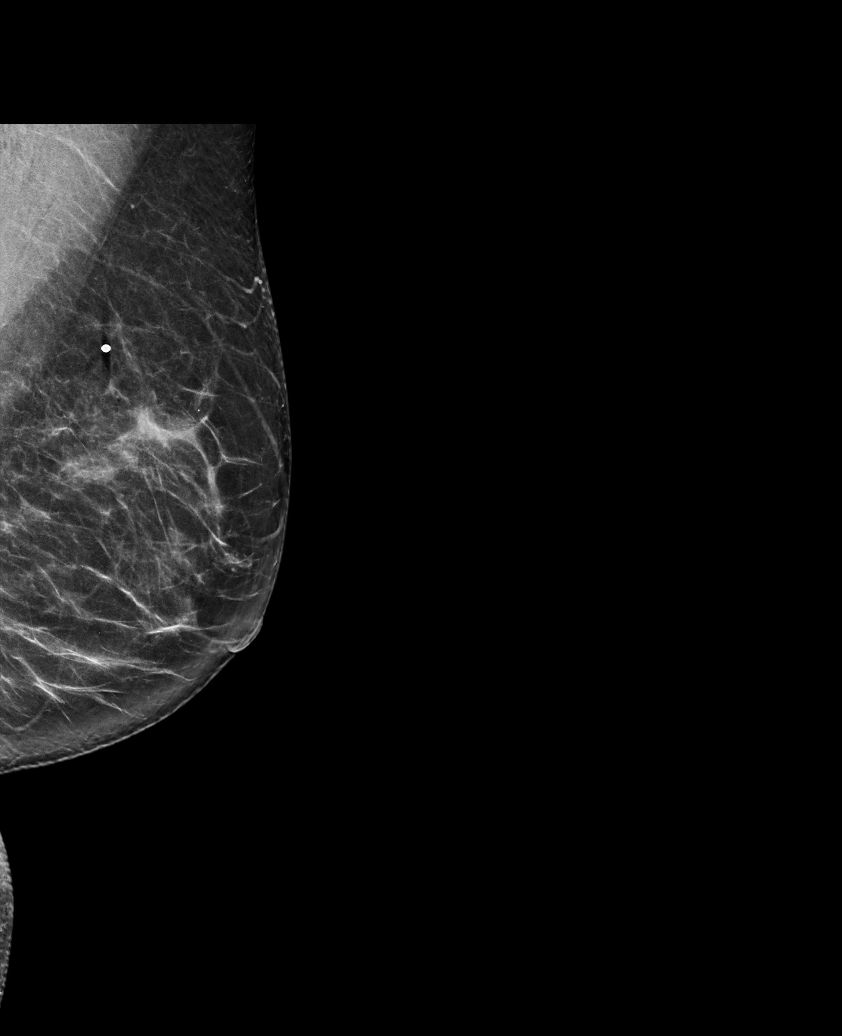

[R MLO synth-2D]
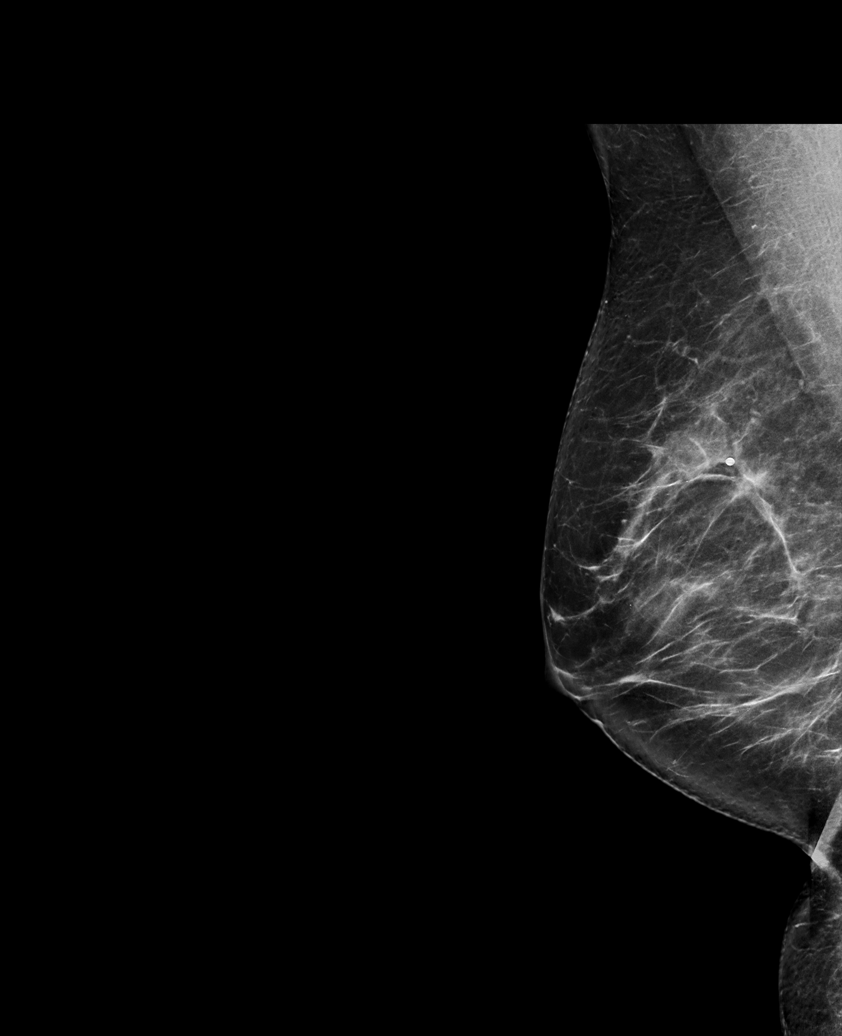

[6 of 36 positions shown; findings below may reference images not displayed]

ACR Breast Density Category c: The breast tissue is heterogeneously
dense, which may obscure small masses.
FINDINGS: Right breast:

Mammogram: No suspicious mass, distortion, or microcalcifications
are identified to suggest presence of malignancy. Spot tangential
view in the area of focal tenderness in the LATERAL portion of the
RIGHT breast shows normal appearing fibroglandular tissue.
Mammographic images were processed with CAD.

Physical Exam: I palpate no abnormality in the LOWER OUTER QUADRANT
of the RIGHT breast in the area of concern to the patient.

Ultrasound: Targeted ultrasound is performed, showing normal
appearing fibroglandular tissue in the LOWER OUTER QUADRANT of the
RIGHT breast. No suspicious mass, distortion, or acoustic shadowing
is demonstrated with ultrasound.

Left breast:

Mammogram: No suspicious mass, distortion, or microcalcifications
are identified to suggest presence of malignancy. Spot tangential
view in the area of focal tenderness in the LATERAL portion of the
LEFT breast shows normal appearing fibroglandular tissue.
Mammographic images were processed with CAD.

Ultrasound: Targeted ultrasound is performed, showing normal
appearing fibroglandular tissue in the LOWER OUTER QUADRANT of the
LEFT breast. No suspicious mass, distortion, or acoustic shadowing
is demonstrated with ultrasound.
IMPRESSION: No mammographic or ultrasound evidence for malignancy.

RECOMMENDATION:
Screening mammogram in one year.(Code:[id number])

I have discussed the findings and recommendations with the patient.
Results were also provided in writing at the conclusion of the
visit. If applicable, a reminder letter will be sent to the patient
regarding the next appointment.

BI-RADS CATEGORY  1: Negative.

## 2020-09-21 ENCOUNTER — Other Ambulatory Visit: Payer: Self-pay | Admitting: Obstetrics and Gynecology

## 2020-09-21 DIAGNOSIS — Z1231 Encounter for screening mammogram for malignant neoplasm of breast: Secondary | ICD-10-CM

## 2020-09-29 ENCOUNTER — Other Ambulatory Visit: Payer: Self-pay

## 2020-09-29 DIAGNOSIS — J309 Allergic rhinitis, unspecified: Secondary | ICD-10-CM

## 2020-09-29 MED ORDER — FLUTICASONE PROPIONATE 50 MCG/ACT NA SUSP: 2.0000 | Freq: Every day | NASAL | 6 refills | Status: DC

## 2020-10-14 ENCOUNTER — Other Ambulatory Visit: Payer: Self-pay

## 2020-10-14 ENCOUNTER — Ambulatory Visit
Admission: RE | Admit: 2020-10-14 | Discharge: 2020-10-14 | Disposition: A | Discharge status: Home / Self Care | Payer: BC Managed Care – PPO | Source: Ambulatory Visit | Attending: Obstetrics and Gynecology | Admitting: Obstetrics and Gynecology

## 2020-10-14 DIAGNOSIS — Z1231 Encounter for screening mammogram for malignant neoplasm of breast: Secondary | ICD-10-CM

## 2020-11-06 DIAGNOSIS — Z03818 Encounter for observation for suspected exposure to other biological agents ruled out: Secondary | ICD-10-CM | POA: Diagnosis not present

## 2020-11-06 DIAGNOSIS — Z20822 Contact with and (suspected) exposure to covid-19: Secondary | ICD-10-CM | POA: Diagnosis not present

## 2020-12-30 ENCOUNTER — Encounter: Payer: Self-pay | Admitting: Family Medicine

## 2020-12-30 ENCOUNTER — Ambulatory Visit: Payer: BC Managed Care – PPO | Admitting: Family Medicine

## 2020-12-30 ENCOUNTER — Other Ambulatory Visit: Payer: Self-pay

## 2020-12-30 VITALS — BP 130/86 | HR 79 | Temp 97.9°F | Ht 65.5 in | Wt 173.2 lb

## 2020-12-30 DIAGNOSIS — R0609 Other forms of dyspnea: Secondary | ICD-10-CM

## 2020-12-30 DIAGNOSIS — Z13220 Encounter for screening for lipoid disorders: Secondary | ICD-10-CM | POA: Diagnosis not present

## 2020-12-30 DIAGNOSIS — Z789 Other specified health status: Secondary | ICD-10-CM

## 2020-12-30 DIAGNOSIS — R079 Chest pain, unspecified: Secondary | ICD-10-CM

## 2020-12-30 LAB — LIPID PANEL
Cholesterol: 187 mg/dL (ref <200)
HDL: 67 mg/dL (ref >=50)
LDL Cholesterol (Calc): 93 mg/dL (calc)
Non-HDL Cholesterol (Calc): 120 mg/dL (calc) (ref <130)
Total CHOL/HDL Ratio: 2.8 (calc) (ref <5.0)
Triglycerides: 168 mg/dL — ABNORMAL HIGH (ref <150)

## 2020-12-30 LAB — CBC WITH DIFFERENTIAL/PLATELET
Absolute Monocytes: 447 cells/uL (ref 200–950)
Basophils Absolute: 31 cells/uL (ref 0–200)
Basophils Relative: 0.4 %
Eosinophils Absolute: 69 cells/uL (ref 15–500)
Eosinophils Relative: 0.9 %
HCT: 38.9 % (ref 35.0–45.0)
Hemoglobin: 13.4 g/dL (ref 11.7–15.5)
Lymphs Abs: 2064 cells/uL (ref 850–3900)
MCH: 32.8 pg (ref 27.0–33.0)
MCHC: 34.4 g/dL (ref 32.0–36.0)
MCV: 95.3 fL (ref 80.0–100.0)
MPV: 10.4 fL (ref 7.5–12.5)
Monocytes Relative: 5.8 %
Neutro Abs: 5090 cells/uL (ref 1500–7800)
Neutrophils Relative %: 66.1 %
Platelets: 333 10*3/uL (ref 140–400)
RBC: 4.08 10*6/uL (ref 3.80–5.10)
RDW: 12.3 % (ref 11.0–15.0)
Total Lymphocyte: 26.8 %
WBC: 7.7 10*3/uL (ref 3.8–10.8)

## 2020-12-30 LAB — HEPATIC FUNCTION PANEL
AG Ratio: 1.6 (calc) (ref 1.0–2.5)
ALT: 14 U/L (ref 6–29)
AST: 16 U/L (ref 10–35)
Albumin: 4.5 g/dL (ref 3.6–5.1)
Alkaline phosphatase (APISO): 79 U/L (ref 37–153)
Bilirubin, Direct: 0.1 mg/dL (ref 0.0–0.2)
Globulin: 2.9 g/dL (calc) (ref 1.9–3.7)
Indirect Bilirubin: 0.5 mg/dL (calc) (ref 0.2–1.2)
Total Bilirubin: 0.6 mg/dL (ref 0.2–1.2)
Total Protein: 7.4 g/dL (ref 6.1–8.1)

## 2020-12-30 LAB — BASIC METABOLIC PANEL
BUN: 11 mg/dL (ref 7–25)
CO2: 25 mmol/L (ref 20–32)
Calcium: 9.5 mg/dL (ref 8.6–10.4)
Chloride: 104 mmol/L (ref 98–110)
Creat: 0.67 mg/dL (ref 0.50–1.03)
Glucose, Bld: 92 mg/dL (ref 65–99)
Potassium: 4.2 mmol/L (ref 3.5–5.3)
Sodium: 137 mmol/L (ref 135–146)

## 2020-12-30 LAB — D-DIMER, QUANTITATIVE: D-Dimer, Quant: 0.28 mcg/mL FEU (ref <0.50)

## 2020-12-30 LAB — TROPONIN I: Troponin I: <3 ng/L (ref <=47)

## 2020-12-30 LAB — TSH: TSH: 1.71 mIU/L

## 2020-12-30 NOTE — Progress Notes (Signed)
Tiffany Meech T. Jeda Pardue, MD, CAQ Sports Medicine [facility] [address] [place], [address]  Phone: [phone]  FAX: [phone]  Tiffany Zamora - 50 y.o. female  MRN [id number]  Date of Birth: 02/16/1971  Date: 12/30/2020  PCP: Lynnda Child, MD  Referral: Lynnda Child, MD  Chief Complaint  Patient presents with   Chest Pain   Shortness of Breath   Hypertension    This visit occurred during the SARS-CoV-2 public health emergency.  Safety protocols were in place, including screening questions prior to the visit, additional usage of staff PPE, and extensive cleaning of exam room while observing appropriate contact time as indicated for disinfecting solutions.   Subjective:   Tiffany Zamora is a 50 y.o. very pleasant female patient with Body mass index is 28.39 kg/m. who presents with the following:  50 yo with SOB and chest pain.  Has been ongoing for years now, maybe 5.  Mon did an hour of spin and then went hiking. Monday night, she had a pain in her chest.  This was not specifically during exercise or even directly after it.  139/88 or something like that.  She felt as if her blood pressure was high, she did check it at home.  Hard time catching her breath.     Did mow the yard.  This is since this event.  Feels sometimes like she cannot catch he breath.  This is been ongoing now for multiple years as above.  Pulm years ago thought that she had some inhaler.  There was a question whether or not she had some adult onset asthma.  She did use an albuterol, but this did not help.  BP right now normal.   11/2017 Echo was normal. Pulmonary work-up was normal.  Does take OCP.  Risk factors: Mom, maybe htn MGF d/c from MI Nomal lipids and bp No history of tobacco Some ETOH No other drugs.   EKG unchanged from 2019  Review of Systems is noted in the HPI, as appropriate  Patient Active Problem List    Diagnosis Date Noted   Encounter for screening colonoscopy    External hemorrhoids 04/21/2020   Constipation 04/21/2020   Urinary incontinence 05/23/2019   Finger laceration, initial encounter 11/27/2018   Mild intermittent asthma 04/25/2018   Chronic dyspnea 09/20/2017   Fracture of metacarpal bone 05/25/2017   Cervical pain (neck) 01/31/2017   Hypertriglyceridemia 09/25/2015   Benign hematuria     Past Medical History:  Diagnosis Date   Benign hematuria ~2005   per pt normal workup with cystoscopy [facility])   Heartburn    IBS (irritable bowel syndrome) 09/25/2015   Seasonal allergies    Urine incontinence     Past Surgical History:  Procedure Laterality Date   COLONOSCOPY  2000   for IBS sxs, WNL per patient   COLONOSCOPY WITH PROPOFOL N/A 05/18/2020   Procedure: COLONOSCOPY WITH PROPOFOL;  Surgeon: Toney Reil, MD;  [facility]: [facility] ENDOSCOPY;  Service: Gastroenterology;  Laterality: N/A;   DILATION AND CURETTAGE OF UTERUS  08/2007   WISDOM TOOTH EXTRACTION  1993    Family History  Problem Relation Age of Onset   Diabetes Sister    Heart attack Maternal Grandfather 56       MI   Hypertension Mother    Tongue cancer Mother        prior smoker   Hypertension Father    Dementia Maternal Grandmother  95       died at 41   Cancer Neg Hx    Stroke Neg Hx      Objective:   BP 130/86   Pulse 79   Temp 97.9 F (36.6 C) (Temporal)   Ht 5' 5.5" (1.664 m)   Wt 173 lb 4 oz (78.6 kg)   LMP 12/09/2020   SpO2 99%   BMI 28.39 kg/m   GEN: No acute distress; alert,appropriate. PULM: Breathing comfortably in no respiratory distress PSYCH: Normally interactive.  CV: RRR, no m/g/r  PULM: Normal respiratory rate, no accessory muscle use. No wheezes, crackles or rhonchi   Laboratory and Imaging Data: Results for orders placed or performed in visit on 12/30/20  Basic metabolic panel  Result Value Ref Range   Glucose, Bld 92 65 - 99 mg/dL   BUN 11 7 - 25 mg/dL    Creat 9.02 4.09 - 7.35 mg/dL   BUN/Creatinine Ratio NOT APPLICABLE 6 - 22 (calc)   Sodium 137 135 - 146 mmol/L   Potassium 4.2 3.5 - 5.3 mmol/L   Chloride 104 98 - 110 mmol/L   CO2 25 20 - 32 mmol/L   Calcium 9.5 8.6 - 10.4 mg/dL  CBC with Differential/Platelet  Result Value Ref Range   WBC 7.7 3.8 - 10.8 Thousand/uL   RBC 4.08 3.80 - 5.10 Million/uL   Hemoglobin 13.4 11.7 - 15.5 g/dL   HCT 32.9 92.4 - 26.8 %   MCV 95.3 80.0 - 100.0 fL   MCH 32.8 27.0 - 33.0 pg   MCHC 34.4 32.0 - 36.0 g/dL   RDW 34.1 96.2 - 22.9 %   Platelets 333 140 - 400 Thousand/uL   MPV 10.4 7.5 - 12.5 fL   Neutro Abs 5,090 1,500 - 7,800 cells/uL   Lymphs Abs 2,064 850 - 3,900 cells/uL   Absolute Monocytes 447 200 - 950 cells/uL   Eosinophils Absolute 69 15 - 500 cells/uL   Basophils Absolute 31 0 - 200 cells/uL   Neutrophils Relative % 66.1 %   Total Lymphocyte 26.8 %   Monocytes Relative 5.8 %   Eosinophils Relative 0.9 %   Basophils Relative 0.4 %  D-dimer, quantitative  Result Value Ref Range   D-Dimer, Quant 0.28 <0.50 mcg/mL FEU  Hepatic function panel  Result Value Ref Range   Total Protein 7.4 6.1 - 8.1 g/dL   Albumin 4.5 3.6 - 5.1 g/dL   Globulin 2.9 1.9 - 3.7 g/dL (calc)   AG Ratio 1.6 1.0 - 2.5 (calc)   Total Bilirubin 0.6 0.2 - 1.2 mg/dL   Bilirubin, Direct 0.1 0.0 - 0.2 mg/dL   Indirect Bilirubin 0.5 0.2 - 1.2 mg/dL (calc)   Alkaline phosphatase (APISO) 79 37 - 153 U/L   AST 16 10 - 35 U/L   ALT 14 6 - 29 U/L  Lipid panel  Result Value Ref Range   Cholesterol 187 <200 mg/dL   HDL 67 > OR = 50 mg/dL   Triglycerides 798 (H) <150 mg/dL   LDL Cholesterol (Calc) 93 mg/dL (calc)   Total CHOL/HDL Ratio 2.8 <5.0 (calc)   Non-HDL Cholesterol (Calc) 120 <130 mg/dL (calc)  TSH  Result Value Ref Range   TSH 1.71 mIU/L  Troponin I  Result Value Ref Range   Troponin I <3 < OR = 47 ng/L     Assessment and Plan:     ICD-10-CM   1. Chest pain, unspecified type  R07.9 EKG 12-Lead  Basic metabolic panel    CBC with Differential/Platelet    Hepatic function panel    TSH    Troponin I    CANCELED: Basic metabolic panel    CANCELED: CBC with Differential/Platelet    CANCELED: Hepatic function panel    CANCELED: Troponin I (High Sensitivity)    CANCELED: TSH    2. DOE (dyspnea on exertion)  R06.09 D-dimer, quantitative    TSH    Troponin I    CANCELED: Troponin I (High Sensitivity)    CANCELED: D-dimer, quantitative    CANCELED: TSH    3. Uses birth control  Z78.9     4. Screening, lipid  Z13.220 Lipid panel    CANCELED: Lipid panel     Total encounter time: 30 minutes. This includes total time spent on the day of encounter.    EKG: Normal sinus rhythm. Normal axis, normal R wave progression, No acute ST elevation or depression.  To me, this EKG does not look different compared to her 2019 EKG.  With some increased risk of blood clot, check a D-dimer.  She is on birth control, but otherwise minimal to no risk or cardiac events.  I did review her echo from 2019, this was normal.  Her troponin today was normal. Also reviewed her pulmonology notes.  I think that we can be reassured with all results and and this should be okay to follow this clinically.  If her symptoms persist or worsen, I think it is probably not unreasonable to do a further ischemic cardiac work-up.  Medications Discontinued During This Encounter  Medication Reason   fexofenadine (ALLEGRA) 180 MG tablet Change in therapy   Magnesium 300 MG CAPS Patient Preference   Orders Placed This Encounter  Procedures   Basic metabolic panel   CBC with Differential/Platelet   D-dimer, quantitative   Hepatic function panel   Lipid panel   TSH   Troponin I   EKG 12-Lead    Follow-up: No follow-ups on file.  [employer] speech-to-text software was used for transcription in this dictation.  Possible transcriptional errors can occur using Animal nutritionist.   Signed,  Elpidio Galea. Jaimarie Rapozo,  MD   Outpatient Encounter Medications as of 12/30/2020  Medication Sig   fluticasone (FLONASE) 50 MCG/ACT nasal spray Place 2 sprays into both nostrils daily.   levocetirizine (XYZAL) 2.5 MG/5ML solution Take 5 mg by mouth every evening.   levonorgestrel-ethinyl estradiol (ALESSE) 0.1-20 MG-MCG tablet Take 1 tablet by mouth daily.   [DISCONTINUED] fexofenadine (ALLEGRA) 180 MG tablet Take 180 mg by mouth daily.   [DISCONTINUED] Magnesium 300 MG CAPS 1 capsule with a meal   No facility-administered encounter medications on file as of 12/30/2020.

## 2021-01-01 NOTE — Telephone Encounter (Signed)
Shanda Bumps,   I think this is your call.  I did do blood work only.  If this were one of my female patients, then I would say to push it back, but I am do not know your practice pattern.

## 2021-01-21 ENCOUNTER — Encounter: Payer: BC Managed Care – PPO | Admitting: Family Medicine

## 2021-01-21 DIAGNOSIS — Z01419 Encounter for gynecological examination (general) (routine) without abnormal findings: Secondary | ICD-10-CM | POA: Diagnosis not present

## 2021-04-23 DIAGNOSIS — H00015 Hordeolum externum left lower eyelid: Secondary | ICD-10-CM | POA: Diagnosis not present

## 2021-09-01 ENCOUNTER — Other Ambulatory Visit: Payer: Self-pay | Admitting: Obstetrics and Gynecology

## 2021-09-01 DIAGNOSIS — Z1231 Encounter for screening mammogram for malignant neoplasm of breast: Secondary | ICD-10-CM

## 2021-10-15 ENCOUNTER — Ambulatory Visit (INDEPENDENT_AMBULATORY_CARE_PROVIDER_SITE_OTHER): Payer: BC Managed Care – PPO | Admitting: Family Medicine

## 2021-10-15 ENCOUNTER — Encounter: Payer: Self-pay | Admitting: Family Medicine

## 2021-10-15 VITALS — BP 138/82 | HR 82 | Temp 97.8°F | Ht 66.0 in | Wt 170.4 lb

## 2021-10-15 DIAGNOSIS — E781 Pure hyperglyceridemia: Secondary | ICD-10-CM

## 2021-10-15 DIAGNOSIS — Z23 Encounter for immunization: Secondary | ICD-10-CM | POA: Diagnosis not present

## 2021-10-15 DIAGNOSIS — Z0000 Encounter for general adult medical examination without abnormal findings: Secondary | ICD-10-CM

## 2021-10-15 DIAGNOSIS — Z1231 Encounter for screening mammogram for malignant neoplasm of breast: Secondary | ICD-10-CM

## 2021-10-15 DIAGNOSIS — E663 Overweight: Secondary | ICD-10-CM

## 2021-10-15 DIAGNOSIS — J4520 Mild intermittent asthma, uncomplicated: Secondary | ICD-10-CM | POA: Diagnosis not present

## 2021-10-15 LAB — COMPREHENSIVE METABOLIC PANEL
ALT: 19 U/L (ref 0–35)
AST: 19 U/L (ref 0–37)
Albumin: 4.3 g/dL (ref 3.5–5.2)
Alkaline Phosphatase: 75 U/L (ref 39–117)
BUN: 10 mg/dL (ref 6–23)
CO2: 27 mEq/L (ref 19–32)
Calcium: 9 mg/dL (ref 8.4–10.5)
Chloride: 104 mEq/L (ref 96–112)
Creatinine, Ser: 0.72 mg/dL (ref 0.40–1.20)
GFR: 96.63 mL/min (ref >60.00)
Glucose, Bld: 84 mg/dL (ref 70–99)
Potassium: 4.1 mEq/L (ref 3.5–5.1)
Sodium: 138 mEq/L (ref 135–145)
Total Bilirubin: 0.6 mg/dL (ref 0.2–1.2)
Total Protein: 7 g/dL (ref 6.0–8.3)

## 2021-10-15 LAB — CBC
HCT: 38.6 % (ref 36.0–46.0)
Hemoglobin: 13 g/dL (ref 12.0–15.0)
MCHC: 33.7 g/dL (ref 30.0–36.0)
MCV: 96.4 fl (ref 78.0–100.0)
Platelets: 290 10*3/uL (ref 150.0–400.0)
RBC: 4 Mil/uL (ref 3.87–5.11)
RDW: 12.7 % (ref 11.5–15.5)
WBC: 6.6 10*3/uL (ref 4.0–10.5)

## 2021-10-15 LAB — LIPID PANEL
Cholesterol: 190 mg/dL (ref 0–200)
HDL: 67.8 mg/dL (ref >39.00)
LDL Cholesterol: 99 mg/dL (ref 0–99)
NonHDL: 121.94
Total CHOL/HDL Ratio: 3
Triglycerides: 114 mg/dL (ref 0.0–149.0)
VLDL: 22.8 mg/dL (ref 0.0–40.0)

## 2021-10-15 LAB — TSH: TSH: 0.82 u[IU]/mL (ref 0.35–5.50)

## 2021-10-15 NOTE — Progress Notes (Signed)
Annual Exam   Chief Complaint:  Chief Complaint  Patient presents with   Annual Exam    No concerns    History of Present Illness:  Ms. Tiffany Zamora is a 51 y.o. No obstetric history on file. who LMP was Patient's last menstrual period was 10/14/2021 (exact date)., presents today for her annual examination.     Nutrition She does get adequate calcium and Vitamin D in her diet. Diet: generally healthy Exercise: spin twice weekly, hiking 1-2 times per week    Social History   Tobacco Use  Smoking Status Never  Smokeless Tobacco Never   Social History   Substance and Sexual Activity  Alcohol Use Yes   Alcohol/week: 0.0 standard drinks of alcohol   Comment: a few times a week, 2 glasses   Social History   Substance and Sexual Activity  Drug Use No     General Health Dentist in the last year: Yes Eye doctor: yes  Safety The patient wears seatbelts: yes.     The patient feels safe at home and in their relationships: yes.   Menstrual:  Still on OCP, period still happened, but less  GYN She is single partner, contraception - OCP (estrogen/progesterone).    Cervical Cancer Screening (21-65):   Last Pap:   2023  Breast Cancer Screening (Age 33-74):  There is no FH of breast cancer. There is no FH of ovarian cancer. BRCA screening Not Indicated.  Last Mammogram: 2022 The patient does want a mammogram this year.    Colon Cancer Screening:  Age 64-75 yo - benefits outweigh the risk. Adults 20-85 yo who have never been screened benefit.  Benefits: 134000 people in 2016 will be diagnosed and 49,000 will die - early detection helps Harms: Complications 2/2 to colonoscopy High Risk (Colonoscopy): genetic disorder (Lynch syndrome or familial adenomatous polyposis), personal hx of IBD, previous adenomatous polyp, or previous colorectal cancer, FamHx start 10 years before the age at diagnosis, increased in males and black race  Options:  FIT - looks for  hemoglobin (blood in the stool) - specific and fairly sensitive - must be done annually Cologuard - looks for DNA and blood - more sensitive - therefore can have more false positives, every 3 years Colonoscopy - every 10 years if normal - sedation, bowl prep, must have someone drive you  Shared decision making and the patient had decided to do Colonoscopy 2022 - due 2032.   Social History   Tobacco Use  Smoking Status Never  Smokeless Tobacco Never   Weight Wt Readings from Last 3 Encounters:  10/15/21 170 lb 6 oz (77.3 kg)  12/30/20 173 lb 4 oz (78.6 kg)  05/18/20 170 lb (77.1 kg)   Patient has high BMI  BMI Readings from Last 1 Encounters:  10/15/21 27.50 kg/m     Chronic disease screening Blood pressure monitoring:  BP Readings from Last 3 Encounters:  10/15/21 138/82  12/30/20 130/86  05/18/20 (!) 96/56    Lipid Monitoring: Indication for screening: age >60, obesity, diabetes, family hx, CV risk factors.  Lipid screening: Yes  Lab Results  Component Value Date   CHOL 187 12/30/2020   HDL 67 12/30/2020   LDLCALC 93 12/30/2020   LDLDIRECT 93.0 09/21/2015   TRIG 168 (H) 12/30/2020   CHOLHDL 2.8 12/30/2020     Diabetes Screening: age >19, overweight, family hx, PCOS, hx of gestational diabetes, at risk ethnicity Diabetes Screening screening: Yes  No results found for: "HGBA1C"  Past Medical History:  Diagnosis Date   Benign hematuria ~2005   per pt normal workup with cystoscopy [facility])   Heartburn    IBS (irritable bowel syndrome) 09/25/2015   Seasonal allergies    Urine incontinence     Past Surgical History:  Procedure Laterality Date   COLONOSCOPY  2000   for IBS sxs, WNL per patient   COLONOSCOPY WITH PROPOFOL N/A 05/18/2020   Procedure: COLONOSCOPY WITH PROPOFOL;  Surgeon: Lin Landsman, MD;  Location: [facility] ENDOSCOPY;  Service: Gastroenterology;  Laterality: N/A;   DILATION AND CURETTAGE OF UTERUS  08/2007   WISDOM TOOTH EXTRACTION  1993     Prior to Admission medications   Medication Sig Start Date End Date Taking? Authorizing Provider  fluticasone (FLONASE) 50 MCG/ACT nasal spray Place 2 sprays into both nostrils daily. Patient taking differently: Place 2 sprays into both nostrils as needed for allergies or rhinitis. 09/29/20  Yes Lesleigh Noe, MD  levocetirizine (XYZAL) 2.5 MG/5ML solution Take 5 mg by mouth every evening.   Yes [provider]  levonorgestrel-ethinyl estradiol (ALESSE) 0.1-20 MG-MCG tablet Take 1 tablet by mouth daily.   Yes [provider]    No Known Allergies  Gynecologic History: Patient's last menstrual period was 10/14/2021 (exact date).  Obstetric History: No obstetric history on file.  Social History   Socioeconomic History   Marital status: Married    Spouse name: Thurmond Butts   Number of children: 1   Years of education: Masters   Highest education level: Not on file  Occupational History   Not on file  Tobacco Use   Smoking status: Never   Smokeless tobacco: Never  Vaping Use   Vaping Use: Never used  Substance and Sexual Activity   Alcohol use: Yes    Alcohol/week: 0.0 standard drinks of alcohol    Comment: a few times a week, 2 glasses   Drug use: No   Sexual activity: Yes    Birth control/protection: Pill  Other Topics Concern   Not on file  Social History Narrative   Wife of Jeneen Rinks "Thurmond Butts" Meisinger and daughter and dog   Occupation: stay at home mom   Edu: master's in criminology      05/23/19   From: [place], moved because her husband is from here   Living: with Husband, Thurmond Butts and daughter and dog   Work: former Engineer, structural, 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      Family: daughter Rodman Pickle (2007), in-laws nearby with a good relationship      Enjoys: hiking, going to the gym, biking, spend time with friends, photography things      Activity: exercises regularly 3-5x/wk   Diet: not so good, trying to change that      Safety   Seat belts: Yes    Guns: Yes  and  secure   Safe in relationships: Yes    Social Determinants of Health   Financial Resource Strain: Not on file  Food Insecurity: Not on file  Transportation Needs: Not on file  Physical Activity: Not on file  Stress: Not on file  Social Connections: Not on file  Intimate Partner Violence: Not on file    Family History  Problem Relation Age of Onset   Diabetes Sister    Heart attack Maternal Grandfather 3       MI   Hypertension Mother    Tongue cancer Mother        prior smoker   Hypertension Father  Dementia Maternal Grandmother 95       died at 26   Cancer Neg Hx    Stroke Neg Hx     Review of Systems  Constitutional:  Negative for chills and fever.  HENT:  Negative for congestion and sore throat.   Eyes:  Negative for blurred vision and double vision.  Respiratory:  Negative for shortness of breath.   Cardiovascular:  Negative for chest pain.  Gastrointestinal:  Negative for heartburn, nausea and vomiting.  Genitourinary: Negative.   Musculoskeletal: Negative.  Negative for myalgias.  Skin:  Negative for rash.  Neurological:  Negative for dizziness and headaches.  Endo/Heme/Allergies:  Does not bruise/bleed easily.  Psychiatric/Behavioral:  Negative for depression. The patient is not nervous/anxious.      Physical Exam BP 138/82   Pulse 82   Temp 97.8 F (36.6 C) (Temporal)   Ht '5\' 6"'  (1.676 m)   Wt 170 lb 6 oz (77.3 kg)   LMP 10/14/2021 (Exact Date)   SpO2 99%   BMI 27.50 kg/m    BP Readings from Last 3 Encounters:  10/15/21 138/82  12/30/20 130/86  05/18/20 (!) 96/56      Physical Exam Constitutional:      General: She is not in acute distress.    Appearance: She is well-developed. She is not diaphoretic.  HENT:     Head: Normocephalic and atraumatic.     Right Ear: External ear normal.     Left Ear: External ear normal.     Nose: Nose normal.  Eyes:     General: No scleral icterus.    Extraocular Movements: Extraocular movements  intact.     Conjunctiva/sclera: Conjunctivae normal.  Cardiovascular:     Rate and Rhythm: Normal rate and regular rhythm.     Heart sounds: No murmur heard. Pulmonary:     Effort: Pulmonary effort is normal. No respiratory distress.     Breath sounds: Normal breath sounds. No wheezing.  Abdominal:     General: Bowel sounds are normal. There is no distension.     Palpations: Abdomen is soft. There is no mass.     Tenderness: There is no abdominal tenderness. There is no guarding or rebound.  Musculoskeletal:        General: Normal range of motion.     Cervical back: Neck supple.  Lymphadenopathy:     Cervical: No cervical adenopathy.  Skin:    General: Skin is warm and dry.     Capillary Refill: Capillary refill takes less than 2 seconds.  Neurological:     Mental Status: She is alert and oriented to person, place, and time.     Deep Tendon Reflexes: Reflexes normal.  Psychiatric:        Mood and Affect: Mood normal.        Behavior: Behavior normal.     Results:  PHQ-9:  [place] Office Visit from 11/02/2017 in [place] at [facility]  PHQ-9 Total Score 5         Assessment: 51 y.o. No obstetric history on file. female here for routine annual physical examination.  Plan: Problem List Items Addressed This Visit       Respiratory   Mild intermittent asthma   Relevant Orders   CBC     Other   Hypertriglyceridemia   Relevant Orders   Lipid panel   Comprehensive metabolic panel   Other Visit Diagnoses     Annual physical exam    -  Primary   Overweight       Relevant Orders   Comprehensive metabolic panel   TSH   Need for shingles vaccine       Relevant Orders   Varicella-zoster vaccine IM (Completed)       Screening: -- Blood pressure screen normal -- cholesterol screening: will obtain -- Weight screening: overweight: continue to monitor -- Diabetes Screening: will obtain -- Nutrition: Encouraged healthy diet  The 10-year ASCVD  risk score (Arnett DK, et al., 2019) is: 1.1%   Values used to calculate the score:     Age: 15 years     Sex: Female     Is Non-Hispanic African American: No     Diabetic: No     Tobacco smoker: No     Systolic Blood Pressure: 683 mmHg     Is BP treated: No     HDL Cholesterol: 67 mg/dL     Total Cholesterol: 187 mg/dL  -- Statin therapy for Age 76-75 with CVD risk >7.5%  Psych -- Depression screening (PHQ-9):  [place] Visit from 11/02/2017 in [place] at [facility] [place]  PHQ-9 Total Score 5        Safety -- tobacco screening: not using -- alcohol screening:  low-risk usage. -- no evidence of domestic violence or intimate partner violence.   Cancer Screening -- pap smear not collected per ASCCP guidelines -- family history of breast cancer screening: done. not at high risk. -- Mammogram -  scheduled -- Colon cancer (age 41+)--  up to date  Immunizations Immunization History  Administered Date(s) Administered   Influenza,inj,Quad PF,6+ Mos 02/06/2018   PFIZER(Purple Top)SARS-COV-2 Vaccination 06/03/2019, 07/02/2019, 02/17/2020, 09/14/2020   Td 03/07/2004   Tdap 01/31/2017   Zoster Recombinat (Shingrix) 10/15/2021    -- flu vaccine - advised getting -- TDAP q10 years up to date -- Shingles (age >61) - will get today -- Covid-19 Vaccine up to date   Encouraged healthy diet and exercise. Encouraged regular vision and dental care.    Lesleigh Noe, MD

## 2021-10-27 ENCOUNTER — Ambulatory Visit
Admission: RE | Admit: 2021-10-27 | Discharge: 2021-10-27 | Disposition: A | Discharge status: Home / Self Care | Payer: BC Managed Care – PPO | Source: Ambulatory Visit | Attending: Obstetrics and Gynecology | Admitting: Obstetrics and Gynecology

## 2021-10-27 DIAGNOSIS — Z1231 Encounter for screening mammogram for malignant neoplasm of breast: Secondary | ICD-10-CM | POA: Diagnosis not present

## 2021-11-04 ENCOUNTER — Ambulatory Visit: Payer: BC Managed Care – PPO | Admitting: Family Medicine

## 2021-11-04 ENCOUNTER — Encounter: Payer: Self-pay | Admitting: Family Medicine

## 2021-11-04 VITALS — BP 100/62 | HR 67 | Temp 98.5°F | Ht 66.0 in | Wt 172.0 lb

## 2021-11-04 DIAGNOSIS — R3915 Urgency of urination: Secondary | ICD-10-CM

## 2021-11-04 DIAGNOSIS — R319 Hematuria, unspecified: Secondary | ICD-10-CM | POA: Diagnosis not present

## 2021-11-04 DIAGNOSIS — S39011A Strain of muscle, fascia and tendon of abdomen, initial encounter: Secondary | ICD-10-CM | POA: Insufficient documentation

## 2021-11-04 LAB — POC URINALSYSI DIPSTICK (AUTOMATED)
Bilirubin, UA: NEGATIVE
Glucose, UA: NEGATIVE
Ketones, UA: NEGATIVE
Leukocytes, UA: NEGATIVE
Nitrite, UA: NEGATIVE
Protein, UA: NEGATIVE
Spec Grav, UA: <=1.005 — AB (ref 1.010–1.025)
Urobilinogen, UA: 0.2 E.U./dL
pH, UA: 8 (ref 5.0–8.0)

## 2021-11-04 NOTE — Progress Notes (Signed)
Patient ID: Tiffany Zamora, female    DOB: 11/05/1970, 51 y.o.   MRN: [id number]  This visit was conducted in person.  BP 100/62   Pulse 67   Temp 98.5 F (36.9 C) (Oral)   Ht 5\' 6"  (1.676 m)   Wt 172 lb (78 kg)   LMP 10/14/2021 (Exact Date)   SpO2 99%   BMI 27.76 kg/m    CC:  Chief Complaint  Patient presents with   Abdominal Pain    Lower abdominal-Felt like you may have pulled something getting off [place] on Saturday   Urinary Urgency         Subjective:   HPI: Tiffany Zamora is a 51 y.o. female  of [address] presenting on 11/04/2021 for Abdominal Pain (Lower abdominal-Felt like you may have pulled something getting off [place] on Saturday) and Urinary Urgency (/)  New onset  sudden pulling pain in lower abdomen in after getting off [place] 6-days ago.  Resolved. She was doing well until yesterday noted sharp pain bilaterally when hiking. Associated with urinary urgency starting later that day.  No dysuria, increase frequency.  No D, no C, no blood in stool , no hematuria.  Feeling slightly bloated and pressure now.   Drinking lots of water.   No recent UTI.   No fever, no flank pain.   Relevant past medical, surgical, family and social history reviewed and updated as indicated. Interim medical history since our last visit reviewed. Allergies and medications reviewed and updated. Outpatient Medications Prior to Visit  Medication Sig Dispense Refill   fluticasone (FLONASE) 50 MCG/ACT nasal spray Place 2 sprays into both nostrils daily as needed for allergies or rhinitis.     levocetirizine (XYZAL) 2.5 MG/5ML solution Take 5 mg by mouth every evening.     levonorgestrel-ethinyl estradiol (ALESSE) 0.1-20 MG-MCG tablet Take 1 tablet by mouth daily.     OVER THE COUNTER MEDICATION Melatonin ER 4 mg po qhs     fluticasone (FLONASE) 50 MCG/ACT nasal spray Place 2 sprays into both nostrils daily. (Patient taking differently: Place 2 sprays into both  nostrils as needed for allergies or rhinitis.) 16 g 6   No facility-administered medications prior to visit.     Per HPI unless specifically indicated in ROS section below Review of Systems  Constitutional:  Negative for fatigue and fever.  HENT:  Negative for congestion.   Eyes:  Negative for pain.  Respiratory:  Negative for cough and shortness of breath.   Cardiovascular:  Negative for chest pain, palpitations and leg swelling.  Genitourinary:  Negative for dysuria and vaginal bleeding.  Musculoskeletal:  Negative for back pain.  Neurological:  Negative for syncope, light-headedness and headaches.  Psychiatric/Behavioral:  Negative for dysphoric mood.    Objective:  BP 100/62   Pulse 67   Temp 98.5 F (36.9 C) (Oral)   Ht 5\' 6"  (1.676 m)   Wt 172 lb (78 kg)   LMP 10/14/2021 (Exact Date)   SpO2 99%   BMI 27.76 kg/m   Wt Readings from Last 3 Encounters:  11/04/21 172 lb (78 kg)  10/15/21 170 lb 6 oz (77.3 kg)  12/30/20 173 lb 4 oz (78.6 kg)      Physical Exam Constitutional:      General: She is not in acute distress.    Appearance: Normal appearance. She is well-developed. She is not ill-appearing or toxic-appearing.  HENT:     Head: Normocephalic.     Right  Ear: Hearing, tympanic membrane, ear canal and external ear normal. Tympanic membrane is not erythematous, retracted or bulging.     Left Ear: Hearing, tympanic membrane, ear canal and external ear normal. Tympanic membrane is not erythematous, retracted or bulging.     Nose: No mucosal edema or rhinorrhea.     Right Sinus: No maxillary sinus tenderness or frontal sinus tenderness.     Left Sinus: No maxillary sinus tenderness or frontal sinus tenderness.     Mouth/Throat:     Pharynx: Uvula midline.  Eyes:     General: Lids are normal. Lids are everted, no foreign bodies appreciated.     Conjunctiva/sclera: Conjunctivae normal.     Pupils: Pupils are equal, round, and reactive to light.  Neck:     Thyroid:  No thyroid mass or thyromegaly.     Vascular: No carotid bruit.     Trachea: Trachea normal.  Cardiovascular:     Rate and Rhythm: Normal rate and regular rhythm.     Pulses: Normal pulses.     Heart sounds: Normal heart sounds, S1 normal and S2 normal. No murmur heard.    No friction rub. No gallop.  Pulmonary:     Effort: Pulmonary effort is normal. No tachypnea or respiratory distress.     Breath sounds: Normal breath sounds. No decreased breath sounds, wheezing, rhonchi or rales.  Abdominal:     General: Bowel sounds are normal.     Palpations: Abdomen is soft.     Tenderness: There is no abdominal tenderness.  Musculoskeletal:     Cervical back: Normal range of motion and neck supple.  Skin:    General: Skin is warm and dry.     Findings: No rash.  Neurological:     Mental Status: She is alert.  Psychiatric:        Mood and Affect: Mood is not anxious or depressed.        Speech: Speech normal.        Behavior: Behavior normal. Behavior is cooperative.        Thought Content: Thought content normal.        Judgment: Judgment normal.       Results for orders placed or performed in visit on 10/29/21  HM PAP SMEAR  Result Value Ref Range   HM Pap smear NILM   Results Console HPV  Result Value Ref Range   CHL HPV Negative      COVID 19 screen:  No recent travel or known exposure to COVID19 The patient denies respiratory symptoms of COVID 19 at this time. The importance of social distancing was discussed today.   Assessment and Plan    Problem List Items Addressed This Visit     Abdominal wall strain, initial encounter    Acute, most consistent with abdominal muscle strain.  Treat with heat, gentle stretching and Tylenol. She will contact me if any additional associated signs symptoms occur such as diarrhea constipation.        Hematuria    Acute, No clear evidence of urinary tract infection.  She reports she does have a history of microscopic hematuria.   She states this occurred over 20 years ago and she had a complete work-up via urology.  No concerns were noted. Of note urinalysis from 2021 did have 3+ blood... Urine culture from the same date returned negative for bacteria.  We will send urine for culture to verify no current infection.  She will return in a couple  of weeks for recheck urinalysis to determine if it has resolved.  If not we will have her reevaluated by urology.       Relevant Orders   Urinalysis, Routine w reflex microscopic   Other Visit Diagnoses     Urinary urgency    -  Primary   Relevant Orders   POCT Urinalysis Dipstick (Automated) (Completed)   Urine Culture        Kerby Nora, MD

## 2021-11-04 NOTE — Patient Instructions (Signed)
Increase water, avoid bladder irritants. We will call with urine culture results. Can use tylenol for abdominal wall strain, gentle strecthing and heat.

## 2021-11-05 LAB — URINE CULTURE
MICRO NUMBER:: 13858619
SPECIMEN QUALITY:: ADEQUATE

## 2021-11-05 NOTE — Assessment & Plan Note (Signed)
Acute, No clear evidence of urinary tract infection.  She reports she does have a history of microscopic hematuria.  She states this occurred over 20 years ago and she had a complete work-up via urology.  No concerns were noted. Of note urinalysis from 2021 did have 3+ blood... Urine culture from the same date returned negative for bacteria.  We will send urine for culture to verify no current infection.  She will return in a couple of weeks for recheck urinalysis to determine if it has resolved.  If not we will have her reevaluated by urology.

## 2021-11-05 NOTE — Assessment & Plan Note (Signed)
Acute, most consistent with abdominal muscle strain.  Treat with heat, gentle stretching and Tylenol. She will contact me if any additional associated signs symptoms occur such as diarrhea constipation.

## 2021-11-19 ENCOUNTER — Other Ambulatory Visit (INDEPENDENT_AMBULATORY_CARE_PROVIDER_SITE_OTHER): Payer: BC Managed Care – PPO

## 2021-11-19 ENCOUNTER — Encounter: Payer: Self-pay | Admitting: Family Medicine

## 2021-11-19 DIAGNOSIS — R319 Hematuria, unspecified: Secondary | ICD-10-CM

## 2021-11-19 LAB — URINALYSIS, ROUTINE W REFLEX MICROSCOPIC
Bilirubin Urine: NEGATIVE
Ketones, ur: NEGATIVE
Leukocytes,Ua: NEGATIVE
Nitrite: NEGATIVE
Specific Gravity, Urine: 1.015 (ref 1.000–1.030)
Total Protein, Urine: NEGATIVE
Urine Glucose: NEGATIVE
Urobilinogen, UA: 0.2 (ref 0.0–1.0)
pH: 7 (ref 5.0–8.0)

## 2021-12-01 ENCOUNTER — Ambulatory Visit: Payer: BC Managed Care – PPO | Admitting: Urology

## 2021-12-01 ENCOUNTER — Encounter: Payer: Self-pay | Admitting: Urology

## 2021-12-01 VITALS — BP 156/77 | HR 69 | Ht 66.0 in | Wt 170.0 lb

## 2021-12-01 DIAGNOSIS — R3129 Other microscopic hematuria: Secondary | ICD-10-CM | POA: Diagnosis not present

## 2021-12-01 DIAGNOSIS — R32 Unspecified urinary incontinence: Secondary | ICD-10-CM

## 2021-12-01 DIAGNOSIS — N3946 Mixed incontinence: Secondary | ICD-10-CM

## 2021-12-01 LAB — URINALYSIS, COMPLETE
Bilirubin, UA: NEGATIVE
Glucose, UA: NEGATIVE
Ketones, UA: NEGATIVE
Leukocytes,UA: NEGATIVE
Nitrite, UA: NEGATIVE
Protein,UA: NEGATIVE
Specific Gravity, UA: 1.025 (ref 1.005–1.030)
Urobilinogen, Ur: 0.2 mg/dL (ref 0.2–1.0)
pH, UA: 5.5 (ref 5.0–7.5)

## 2021-12-01 LAB — MICROSCOPIC EXAMINATION

## 2021-12-01 NOTE — Progress Notes (Signed)
12/01/2021 4:44 PM   Yvone Neu 28-Dec-1970 [id number]  Referring provider: Lesleigh Noe, MD [place],  [place] [address]  No chief complaint on file.   HPI: 51 year old female who presents today for further evaluation of incidental microscopic hematuria.  She reports today that about 18 years ago when living in [place], she had the same issue.  She underwent a full work-up at that time.  Nothing was identified.  More recently, she presented about a month ago to her PCP with some lower abdominal pressure and urgency was concerned that she may have a UTI.  At that time, only incidental microscopic hematuria was identified but no evidence of infection.  Urinalysis today with 11-30 red blood cells per high-power field, otherwise unremarkable.  She denies any flank pain, history of kidney stones.  She herself is never smoker but had a pretty significant secondhand smoking exposure with both of her parents and siblings who smoked in the house until age 78.  No dye exposures.  No history of gross hematuria.  She does mention today that she has some mild mixed urinary incontinence.  She leaks when she sneezes as well as sometimes will have significant urgency for that she will get to the bathroom in time.  These seem to be progressing with age.   PMH: Past Medical History:  Diagnosis Date   Benign hematuria ~2005   per pt normal workup with cystoscopy [facility])   Heartburn    IBS (irritable bowel syndrome) 09/25/2015   Seasonal allergies    Urine incontinence     Surgical History: Past Surgical History:  Procedure Laterality Date   COLONOSCOPY  2000   for IBS sxs, WNL per patient   COLONOSCOPY WITH PROPOFOL N/A 05/18/2020   Procedure: COLONOSCOPY WITH PROPOFOL;  Surgeon: Lin Landsman, MD;  Location: [facility] ENDOSCOPY;  Service: Gastroenterology;  Laterality: N/A;   DILATION AND CURETTAGE OF UTERUS  08/2007   WISDOM TOOTH EXTRACTION  1993    Home  Medications:  Allergies as of 12/01/2021   No Known Allergies      Medication List        Accurate as of December 01, 2021  4:44 PM. If you have any questions, ask your nurse or doctor.          fluticasone 50 MCG/ACT nasal spray Commonly known as: FLONASE Place 2 sprays into both nostrils daily as needed for allergies or rhinitis.   levocetirizine 2.5 MG/5ML solution Commonly known as: XYZAL Take 5 mg by mouth every evening.   levonorgestrel-ethinyl estradiol 0.1-20 MG-MCG tablet Commonly known as: ALESSE Take 1 tablet by mouth daily.   OVER THE COUNTER MEDICATION Melatonin ER 4 mg po qhs        Allergies: No Known Allergies  Family History: Family History  Problem Relation Age of Onset   Diabetes Sister    Heart attack Maternal Grandfather 80       MI   Hypertension Mother    Tongue cancer Mother        prior smoker   Hypertension Father    Dementia Maternal Grandmother 95       died at 79   Cancer Neg Hx    Stroke Neg Hx     Social History:  reports that she has never smoked. She has never used smokeless tobacco. She reports current alcohol use. She reports that she does not use drugs.   Physical Exam: BP (!) 156/77  Pulse 69   Ht 5\' 6"  (1.676 m)   Wt 170 lb (77.1 kg)   BMI 27.44 kg/m   Constitutional:  Alert and oriented, No acute distress. HEENT: [place] AT, moist mucus membranes.  Trachea midline, no masses. Neurologic: Grossly intact, no focal deficits, moving all 4 extremities. Psychiatric: Normal mood and affect.  Laboratory Data: Lab Results  Component Value Date   WBC 6.6 10/15/2021   HGB 13.0 10/15/2021   HCT 38.6 10/15/2021   MCV 96.4 10/15/2021   PLT 290.0 10/15/2021    Lab Results  Component Value Date   CREATININE 0.72 10/15/2021     Assessment & Plan:    1. Microscopic hematuria We discussed the AUA guidelines for microscopic hematuria.  Technically based on her age, degree of hematuria, and no smoking history, she  actually does fall into the low risk category.  As such, recommended renal ultrasound and cystoscopy.  If there is any significant findings, could consider CT urogram for further evaluation.  She is agreeable this plan.  We discussed risk and benefits.  2. Mixed incontinence Mild mixed urinary incontinence.  We discussed the pathophysiology of each.  Recommend Kegel exercises for pelvic floor strengthening as well as behavioral modification for urgency and urge incontinence which was discussed.  Alternatives including referral to one of my partners for consideration of surgical procedural intervention for stress incontinence versus medications for urge incontinence were discussed.  She like to abstain for this for the time being and manage conservatively. - Urinalysis, Complete - 12/15/2021 RENAL; Future   Return in about 4 weeks (around 12/29/2021) for follow up ultrasound and cysto.  12/31/2021, MD  [facility] [address] [place], [place] [phone]

## 2021-12-09 ENCOUNTER — Ambulatory Visit
Admission: RE | Admit: 2021-12-09 | Discharge: 2021-12-09 | Disposition: A | Discharge status: Home / Self Care | Payer: BC Managed Care – PPO | Source: Ambulatory Visit | Attending: Urology | Admitting: Urology

## 2021-12-09 DIAGNOSIS — R3129 Other microscopic hematuria: Secondary | ICD-10-CM | POA: Insufficient documentation

## 2021-12-09 DIAGNOSIS — N3946 Mixed incontinence: Secondary | ICD-10-CM | POA: Diagnosis not present

## 2021-12-09 DIAGNOSIS — R319 Hematuria, unspecified: Secondary | ICD-10-CM | POA: Diagnosis not present

## 2021-12-20 ENCOUNTER — Ambulatory Visit (INDEPENDENT_AMBULATORY_CARE_PROVIDER_SITE_OTHER): Payer: BC Managed Care – PPO | Admitting: Gastroenterology

## 2021-12-20 ENCOUNTER — Encounter: Payer: Self-pay | Admitting: Gastroenterology

## 2021-12-20 DIAGNOSIS — K580 Irritable bowel syndrome with diarrhea: Secondary | ICD-10-CM

## 2021-12-20 NOTE — Progress Notes (Signed)
Primary Care Physician: Waunita Schooner, MD  Primary Gastroenterologist:  Dr. Lucilla Lame  Chief Complaint  Patient presents with   Diarrhea        Abdominal Cramping    HPI: Tiffany Zamora is a 51 y.o. female here with a history of IBS.  The patient reports that she usually has one bowel movement a day but states that she has increased bowel movements with stress.  She is concerned because she likes to go hiking and usually is in a position where she is not near a bathroom and has to have a bowel movement. There is no report of any unexplained weight loss fevers chills nausea vomiting black stools or bloody stools.  The patient had a colonoscopy last year by Dr. Marius Ditch. The patient has decided to come see me because of having to friends to see me as there gastrologist.  Past Medical History:  Diagnosis Date   Benign hematuria ~2005   per pt normal workup with cystoscopy [facility])   Heartburn    IBS (irritable bowel syndrome) 09/25/2015   Seasonal allergies    Urine incontinence     Current Outpatient Medications  Medication Sig Dispense Refill   fluticasone (FLONASE) 50 MCG/ACT nasal spray Place 2 sprays into both nostrils daily as needed for allergies or rhinitis.     levocetirizine (XYZAL) 2.5 MG/5ML solution Take 5 mg by mouth every evening.     levonorgestrel-ethinyl estradiol (ALESSE) 0.1-20 MG-MCG tablet Take 1 tablet by mouth daily.     OVER THE COUNTER MEDICATION Melatonin ER 4 mg po qhs     No current facility-administered medications for this visit.    Allergies as of 12/20/2021   (No Known Allergies)    ROS:  General: Negative for anorexia, weight loss, fever, chills, fatigue, weakness. ENT: Negative for hoarseness, difficulty swallowing , nasal congestion. CV: Negative for chest pain, angina, palpitations, dyspnea on exertion, peripheral edema.  Respiratory: Negative for dyspnea at rest, dyspnea on exertion, cough, sputum, wheezing.  GI: See history of  present illness. GU:  Negative for dysuria, hematuria, urinary incontinence, urinary frequency, nocturnal urination.  Endo: Negative for unusual weight change.    Physical Examination:   There were no vitals taken for this visit.  General: Well-nourished, well-developed in no acute distress.  Eyes: No icterus. Conjunctivae pink. Neuro: Alert and oriented x 3.  Grossly intact. Psych: Alert and cooperative, normal mood and affect.  Labs:    Imaging Studies: US RENAL  Result Date: 12/09/2021 CLINICAL DATA:  Incontinence.  Hematuria. EXAM: RENAL / URINARY TRACT ULTRASOUND COMPLETE COMPARISON:  None Available. FINDINGS: Right Kidney: Renal measurements: 10.6 x 4.6 x 5.5 cm = volume: 140 mL. Echogenicity within normal limits. No mass or hydronephrosis visualized. Left Kidney: Renal measurements: 10.8 x 4.8 x 5.0 cm = volume: 136 mL. Echogenicity within normal limits. No mass or hydronephrosis visualized. Bladder: Appears normal for degree of bladder distention. Other: None. IMPRESSION: Normal study. No cause for hematuria identified. Electronically Signed   By: Dorise Bullion III M.D.   On: 12/09/2021 15:11    Assessment and Plan:   Tiffany Zamora is a 51 y.o. y/o female who comes in today with a history of irritable bowel syndrome with diarrhea predominance.  The patient has been told to get liquid Imodium and to titrate it to affect so that she can go out without having concerns about having to use the bathroom.  She has also been told that a low FODMAP  diet may be helpful for her irritable bowel syndrome.  She is cutting out milk but still has cheese and she has been told to try and see if the cheeses of factor in her symptoms. She will also take a fiber supplement daily to see if she can bulk up her stools.  The patient has been explained the plan and agrees with it.  She will contact me if her symptoms do not improve thereby proceeding with further recommendations at that  time.     Midge Minium, MD. Clementeen Graham    Note: This dictation was prepared with Dragon dictation along with smaller phrase technology. Any transcriptional errors that result from this process are unintentional.

## 2022-01-05 ENCOUNTER — Ambulatory Visit: Payer: BC Managed Care – PPO | Admitting: Urology

## 2022-01-05 ENCOUNTER — Encounter: Payer: Self-pay | Admitting: Urology

## 2022-01-05 VITALS — BP 143/86 | HR 73 | Ht 66.0 in | Wt 171.0 lb

## 2022-01-05 DIAGNOSIS — R3129 Other microscopic hematuria: Secondary | ICD-10-CM | POA: Diagnosis not present

## 2022-01-05 LAB — URINALYSIS, COMPLETE
Bilirubin, UA: NEGATIVE
Glucose, UA: NEGATIVE
Ketones, UA: NEGATIVE
Leukocytes,UA: NEGATIVE
Nitrite, UA: NEGATIVE
Protein,UA: NEGATIVE
Specific Gravity, UA: 1.015 (ref 1.005–1.030)
Urobilinogen, Ur: 0.2 mg/dL (ref 0.2–1.0)
pH, UA: 5.5 (ref 5.0–7.5)

## 2022-01-05 LAB — MICROSCOPIC EXAMINATION: RBC, Urine: >30 /hpf — AB (ref 0–2)

## 2022-01-05 NOTE — Progress Notes (Signed)
   01/06/22  CC:  Chief Complaint  Patient presents with   Cysto    HPI: 51 year old female with a personal history of microscopic hematuria who presents today for cystoscopy.  She does have more microscopic hematuria today although she is menstruating.  Renal ultrasound was unremarkable.  This study was personally reviewed today.  Blood pressure (!) 143/86, pulse 73, height 5\' 6"  (1.676 m), weight 171 lb (77.6 kg). NED. A&Ox3.   No respiratory distress   Abd soft, NT, ND Normal external genitalia with patent urethral meatus  Cystoscopy Procedure Note  Patient identification was confirmed, informed consent was obtained, and patient was prepped using Betadine solution.  Lidocaine jelly was administered per urethral meatus.    Procedure: - Flexible cystoscope introduced, without any difficulty.   - Thorough search of the bladder revealed:    normal urethral meatus    normal urothelium    no stones    no ulcers     no tumors    no urethral polyps    no trabeculation  - Ureteral orifices were normal in position and appearance.  Post-Procedure: - Patient tolerated the procedure well  Assessment/ Plan:  1. Microscopic hematuria Cystoscopy today was unremarkable which is reassuring  Renal ultrasound was also unremarkable  Degree of hematuria is increased today although she is menstruating which accounts for more blood in her urine  We will plan to recheck her urinalysis in 12 months, if the degree of hematuria increases or persist, consider CT urogram.    If she develops any urinary symptoms or gross hematuria, will follow-up sooner.  She is agreeable with this plan. - Urinalysis, Complete    Return in about 1 year (around 01/06/2023) for UA, symptom recheck with Sam or Larene Beach.  Hollice Espy, MD

## 2022-01-24 DIAGNOSIS — Z01419 Encounter for gynecological examination (general) (routine) without abnormal findings: Secondary | ICD-10-CM | POA: Diagnosis not present

## 2022-02-03 ENCOUNTER — Emergency Department
Admission: EM | Admit: 2022-02-03 | Discharge: 2022-02-03 | Disposition: A | Discharge status: Home / Self Care | Payer: BC Managed Care – PPO | Attending: Emergency Medicine | Admitting: Emergency Medicine

## 2022-02-03 ENCOUNTER — Telehealth: Payer: Self-pay | Admitting: Family Medicine

## 2022-02-03 ENCOUNTER — Emergency Department: Payer: BC Managed Care – PPO

## 2022-02-03 ENCOUNTER — Other Ambulatory Visit: Payer: Self-pay

## 2022-02-03 DIAGNOSIS — R0602 Shortness of breath: Secondary | ICD-10-CM | POA: Insufficient documentation

## 2022-02-03 DIAGNOSIS — R0789 Other chest pain: Secondary | ICD-10-CM | POA: Diagnosis not present

## 2022-02-03 LAB — CBC
HCT: 39.9 % (ref 36.0–46.0)
Hemoglobin: 13.4 g/dL (ref 12.0–15.0)
MCH: 31.6 pg (ref 26.0–34.0)
MCHC: 33.6 g/dL (ref 30.0–36.0)
MCV: 94.1 fL (ref 80.0–100.0)
Platelets: 308 10*3/uL (ref 150–400)
RBC: 4.24 MIL/uL (ref 3.87–5.11)
RDW: 11.9 % (ref 11.5–15.5)
WBC: 6.3 10*3/uL (ref 4.0–10.5)
nRBC: 0 % (ref 0.0–0.2)

## 2022-02-03 LAB — BASIC METABOLIC PANEL
Anion gap: 6 (ref 5–15)
BUN: 17 mg/dL (ref 6–20)
CO2: 26 mmol/L (ref 22–32)
Calcium: 9.1 mg/dL (ref 8.9–10.3)
Chloride: 107 mmol/L (ref 98–111)
Creatinine, Ser: 0.62 mg/dL (ref 0.44–1.00)
GFR, Estimated: >60 mL/min (ref >60)
Glucose, Bld: 155 mg/dL — ABNORMAL HIGH (ref 70–99)
Potassium: 3.5 mmol/L (ref 3.5–5.1)
Sodium: 139 mmol/L (ref 135–145)

## 2022-02-03 LAB — TROPONIN I (HIGH SENSITIVITY)
Troponin I (High Sensitivity): <2 ng/L (ref <18)
Troponin I (High Sensitivity): 3 ng/L (ref <18)

## 2022-02-03 MED ORDER — ASPIRIN 81 MG PO CHEW
324.0000 mg | CHEWABLE_TABLET | Freq: Once | ORAL | Status: AC
  Administered 2022-02-03: 324 mg via ORAL
  Filled 2022-02-03: qty 4

## 2022-02-03 MED ORDER — ALBUTEROL SULFATE HFA 108 (90 BASE) MCG/ACT IN AERS: 2.0000 | INHALATION_SPRAY | Freq: Four times a day (QID) | RESPIRATORY_TRACT | 2 refills | Status: DC | PRN

## 2022-02-03 MED ORDER — IOHEXOL 350 MG/ML SOLN
75.0000 mL | Freq: Once | INTRAVENOUS | Status: AC | PRN
  Administered 2022-02-03: 75 mL via INTRAVENOUS

## 2022-02-03 NOTE — ED Provider Notes (Signed)
[facility] Provider Note  Patient Contact: 7:15 PM (approximate)   History   Shortness of Breath   HPI  Tiffany Zamora is a 51 y.o. female who presents the emergency department complaining of intermittent shortness of breath has been worse over the last week and a half and was significantly worse today.  Patient states that she has been dealing with the symptoms off and on for several years.  She seen cardiology, pulmonology, primary care with no results.  Patient has reported over the last week she is felt short of breath going up stairs, any activity.  No peripheral edema.  States that symptoms were consistent with previous episodes until today when she felt like she could not catch her breath and she presents to the ED.  No chest pain.  No GI complaints.  No recent viral URI symptoms.  No cough.     Physical Exam   Triage Vital Signs: ED Triage Vitals  Enc Vitals Group     BP 02/03/22 1414 (!) 138/90     Pulse Rate 02/03/22 1414 75     Resp 02/03/22 1414 16     Temp 02/03/22 1414 98.2 F (36.8 C)     Temp Source 02/03/22 1414 Oral     SpO2 02/03/22 1414 99 %     Weight 02/03/22 1412 171 lb 1.2 oz (77.6 kg)     Height 02/03/22 1412 5\' 6"  (1.676 m)     Head Circumference --      Peak Flow --      Pain Score 02/03/22 1412 1     Pain Loc --      Pain Edu? --      Excl. in [place]? --     Most recent vital signs: Vitals:   02/03/22 1414 02/03/22 2203  BP: (!) 138/90 135/79  Pulse: 75 64  Resp: 16 20  Temp: 98.2 F (36.8 C) 97.9 F (36.6 C)  SpO2: 99% 97%     General: Alert and in no acute distress. ENT:      Ears:       Nose: No congestion/rhinnorhea.      Mouth/Throat: Mucous membranes are moist. Neck: No stridor. No cervical spine tenderness to palpation. Hematological/Lymphatic/Immunilogical: No cervical lymphadenopathy. Cardiovascular:  Good peripheral perfusion Respiratory: Normal respiratory effort without tachypnea or  retractions. Lungs CTAB. Good air entry to the bases with no decreased or absent breath sounds. Musculoskeletal: Full range of motion to all extremities.  Neurologic:  No gross focal neurologic deficits are appreciated.  Skin:   No rash noted Other:   ED Results / Procedures / Treatments   Labs (all labs ordered are listed, but only abnormal results are displayed) Labs Reviewed  BASIC METABOLIC PANEL - Abnormal; Notable for the following components:      Result Value   Glucose, Bld 155 (*)    All other components within normal limits  CBC  POC URINE PREG, ED  TROPONIN I (HIGH SENSITIVITY)  TROPONIN I (HIGH SENSITIVITY)     EKG  ED ECG REPORT I, Charline Bills Journie Howson,  personally viewed and interpreted this ECG.   Date: 02/03/2022  EKG Time: 1412 hrs.  Rate: 78 bpm  Rhythm: unchanged from previous tracings, normal sinus rhythm  Axis: Normal axis  Intervals:none  ST&T Change: No ST elevation or depression noted  Normal sinus rhythm.  No STEMI.  No significant change from previous EKGs.    RADIOLOGY  I personally viewed, evaluated,  and interpreted these images as part of my medical decision making, as well as reviewing the written report by the radiologist.  ED Provider Interpretation: Chest x-ray without cardiopulmonary abnormality.  CT scan reveals no evidence of PE or other underlying cardiopulmonary abnormality.  CT Angio Chest PE W and/or Wo Contrast  Result Date: 02/03/2022 CLINICAL DATA:  Chest tightness and shortness of breath, palpitations EXAM: CT ANGIOGRAPHY CHEST WITH CONTRAST TECHNIQUE: Multidetector CT imaging of the chest was performed using the standard protocol during bolus administration of intravenous contrast. Multiplanar CT image reconstructions and MIPs were obtained to evaluate the vascular anatomy. RADIATION DOSE REDUCTION: This exam was performed according to the departmental dose-optimization program which includes automated exposure control,  adjustment of the mA and/or kV according to patient size and/or use of iterative reconstruction technique. CONTRAST:  45mL OMNIPAQUE IOHEXOL 350 MG/ML SOLN COMPARISON:  02/03/2022, 09/22/2017 FINDINGS: Cardiovascular: This is a technically adequate evaluation of the pulmonary vasculature. No filling defects or pulmonary emboli. Mild cardiomegaly without pericardial effusion. No evidence of thoracic aortic aneurysm or dissection. Mediastinum/Nodes: No enlarged mediastinal, hilar, or axillary lymph nodes. Thyroid gland, trachea, and esophagus demonstrate no significant findings. Lungs/Pleura: No airspace disease, effusion, or pneumothorax. Stable 5 mm left lower lobe pulmonary nodule, reference image 72/4, benign given long-term stability. No further imaging workup is required. Upper Abdomen: No acute abnormality. Musculoskeletal: No acute or destructive bony lesions. Reconstructed images demonstrate no additional findings. Review of the MIP images confirms the above findings. IMPRESSION: 1. No evidence of pulmonary embolus. 2. Mild cardiomegaly. 3. No acute intrathoracic process. Electronically Signed   By: Sharlet Salina M.D.   On: 02/03/2022 20:25   DG Chest Portable 1 View  Result Date: 02/03/2022 CLINICAL DATA:  Chest tightness and shortness of breath EXAM: PORTABLE CHEST 1 VIEW COMPARISON:  Chest x-ray dated September 20, 2017 FINDINGS: The heart size and mediastinal contours are within normal limits. Both lungs are clear. The visualized skeletal structures are unremarkable. IMPRESSION: No active disease. Electronically Signed   By: Allegra Lai M.D.   On: 02/03/2022 15:19    PROCEDURES:  Critical Care performed: No  Procedures   MEDICATIONS ORDERED IN ED: Medications  aspirin chewable tablet 324 mg (324 mg Oral Given 02/03/22 1931)  iohexol (OMNIPAQUE) 350 MG/ML injection 75 mL (75 mLs Intravenous Contrast Given 02/03/22 2000)     IMPRESSION / MDM / ASSESSMENT AND PLAN / ED COURSE  I  reviewed the triage vital signs and the nursing notes.                              Differential diagnosis includes, but is not limited to, bronchitis, pneumonia, viral respiratory infection, asthma, COPD, PE  Patient's presentation is most consistent with acute presentation with potential threat to life or bodily function.   Patient's diagnosis is consistent with shortness of breath.  Patient presented to the emergency department with intermittent shortness of breath over the last 7 to 8 years.  Patient states that she has seen cardiology multiple times, pulmonology multiple times as well as her primary care.  It appears to be primarily exercise-induced though patient states that she will have times where she has shortness of breath without exercise.  She states that nobody has arrived at a definitive reason that she has these episodes of shortness of breath.  She was diagnosed with adult onset asthma but states that she has never had a true exacerbation, has never  had wheezing and is never used albuterol.  Overall today's exam was reassuring.  She states that over the last week with the weather change she has felt more short of breath than normal and presents today for evaluation.  No chest pain.  Patient's workup is reassuring with reassuring EKG, labs, chest x-ray and CT scan of the chest.  There is no evidence of PE, STEMI, NSTEMI, pneumonia, bronchitis, atelectasis.  At this time since patient does have a diagnosis of adult onset asthma from pulmonology I recommended a trial of albuterol as the patient has never used same in the past for her shortness of breath.  Patient is agreeable to this plan.  She also notes that she started to have these episodes after switching to a new hormonal birth control roughly 8 years ago.  Patient has had a conversation this week with her OB/GYN and will be stopping her hormonal birth control.  If symptoms persist follow-up with primary care, cardiology or pulmonology  for further investigation.  Concerning signs and symptoms and return precautions discussed with the patient at this time.  Patient is stable for discharge at this time..  Patient is given ED precautions to return to the ED for any worsening or new symptoms.        FINAL CLINICAL IMPRESSION(S) / ED DIAGNOSES   Final diagnoses:  Shortness of breath     Rx / DC Orders   ED Discharge Orders          Ordered    albuterol (VENTOLIN HFA) 108 (90 Base) MCG/ACT inhaler  Every 6 hours PRN        02/03/22 2227             Note:  This document was prepared using Dragon voice recognition software and may include unintentional dictation errors.   Darletta Moll, PA-C 02/04/22 0002    Vanessa South Eliot, MD 02/04/22 1100

## 2022-02-03 NOTE — Telephone Encounter (Signed)
Patient called in stating that she has been experiencing SOB and fatigue for the last week and a half. She said that she is just having a hard time catching her breathe. I sent her to access nurse to be triaged.

## 2022-02-03 NOTE — ED Triage Notes (Signed)
First Nurse Note:  C/O intermittent SOB and fatigue, generally not feeling well x 10 days.  States symptoms worse today.  States feels unable to catch breath.  AAOx3.  Skin warm and dry.  No SOB/ DOE.  Voice clear and strong. NAD

## 2022-02-03 NOTE — Telephone Encounter (Signed)
Per access nurse note pt agreed to go to ED as access nurse disposition given. Do not see TOC done or scheduled. Sending note to Worthy Rancher FNP.

## 2022-02-03 NOTE — Telephone Encounter (Signed)
[facility] - Client TELEPHONE ADVICE RECORD [facility] Patient Name: Tiffany Zamora Gender: Female DOB: Oct 22, 1970 Age: 51 Y 11 M 25 D Return Phone Number: [phone] (Primary) Address: City/ State/ Zip: [place] [address] Client [facility] - Client Client Site [facility] - Day Provider Kerby Nora - MD Contact Type Call Who Is Calling Patient / Member / Family / Caregiver Call Type Triage / Clinical Relationship To Patient Self Return Phone Number [phone] (Primary) Chief Complaint BREATHING - shortness of breath or sounds breathless Reason for Call Symptomatic / Request for Health Information Initial Comment Caller states pt has SOB and fatigue. Translation No Nurse Assessment Nurse: Lily Kocher, RN, Adriana Date/Time (Eastern Time): 02/03/2022 1:18:10 PM Confirm and document reason for call. If symptomatic, describe symptoms. ---pt states she has been having a hard time catching her breath. today it is worse. sob with exertion. has been ongoing since last monday. Does the patient have any new or worsening symptoms? ---Yes Will a triage be completed? ---Yes Related visit to physician within the last 2 weeks? ---No Does the PT have any chronic conditions? (i.e. diabetes, asthma, this includes High risk factors for pregnancy, etc.) ---No Is the patient pregnant or possibly pregnant? (Ask all females between the ages of 10-55) ---No Is this a behavioral health or substance abuse call? ---No Guidelines Guideline Title Affirmed Question Affirmed Notes Nurse Date/Time (Eastern Time) Breathing Difficulty [1] MODERATE difficulty breathing (e.g., speaks in phrases, SOB even at rest, pulse 100-120) AND [2] NEW-onset or WORSE than normal Lily Kocher, Charity fundraiser, Ricki Rodriguez 02/03/2022 1:19:56 PM PLEASE NOTE: All timestamps contained within this report are represented as [place] Standard  Time. CONFIDENTIALTY NOTICE: This fax transmission is intended only for the addressee. It contains information that is legally privileged, confidential or otherwise protected from use or disclosure. If you are not the intended recipient, you are strictly prohibited from reviewing, disclosing, copying using or disseminating any of this information or taking any action in reliance on or regarding this information. If you have received this fax in error, please notify us immediately by telephone so that we can arrange for its return to [place]. Phone: [phone], Toll-Free: [phone], Fax: [phone] Page: 2 of 2 Call Id: [id number] Disp. Time Lamount Cohen Time) Disposition Final User 02/03/2022 1:14:49 PM Send to Urgent Queue Anastasio Champion 02/03/2022 1:21:36 PM Go to ED Now Yes Lily Kocher, RN, Adriana Final Disposition 02/03/2022 1:21:36 PM Go to ED Now Yes Lily Kocher, RN, Adriana Caller Disagree/Comply Comply Caller Understands Yes PreDisposition Call Doctor Care Advice Given Per Guideline GO TO ED NOW: CARE ADVICE given per Breathing Difficulty (Adult) guideline. NOTE TO TRIAGER - DRIVING: * Another adult should drive. Referrals [facility] - E

## 2022-02-03 NOTE — ED Triage Notes (Signed)
Pt here with chest tightness and SOB since last Monday. Pt states the tightness is intermittent. Pt states she has a brief period of palpitations on Sunday. Pt states the tightness is centered but does not radiate.

## 2022-02-09 ENCOUNTER — Telehealth: Payer: Self-pay

## 2022-02-09 ENCOUNTER — Ambulatory Visit: Payer: BC Managed Care – PPO

## 2022-02-09 NOTE — Telephone Encounter (Signed)
Called to apologize to pt for nurse visits running late today.  Let her know that we had a medical emergency that staff were responding to.  Pt reported that she came to the front desk several times and was just told that the nurse was "busy" she would have been much more understanding and waited longer if she had known the situation.  I apologized and thanked her for understanding.  Offered to make another appointment for her vaccination but she declined and reported that she had already made any appointment at [facility].

## 2022-02-25 DIAGNOSIS — H16142 Punctate keratitis, left eye: Secondary | ICD-10-CM | POA: Diagnosis not present

## 2022-03-31 DIAGNOSIS — N3946 Mixed incontinence: Secondary | ICD-10-CM | POA: Insufficient documentation

## 2022-04-18 ENCOUNTER — Telehealth: Payer: Self-pay | Admitting: Family Medicine

## 2022-04-18 NOTE — Telephone Encounter (Signed)
Patient is scheduled for TOC on 07/04/2022 ,she is asking about getting a physical done on the same day? She also stated that if shes able to get a physical the same day,could she come in before then to have labs done so that they can be discussed at her appointment? Because she would like to fast for them to have accurate labs.

## 2022-04-19 NOTE — Telephone Encounter (Signed)
Will route to [place] for advice. Please see patients question about her OV that's coming up.

## 2022-04-20 NOTE — Telephone Encounter (Signed)
I do not do labs prior to appt as we might need to add additional labs at visit based off of current concerns/past history as of which I am not familiar with, we will determine at [facility] if CPE can be done depending on if patient has any other acute concerns at visit.

## 2022-04-20 NOTE — Telephone Encounter (Signed)
Unable to speak with patient. Left a message to return the call to our office. Regarding labs that can't be done prior to her appt.

## 2022-04-20 NOTE — Telephone Encounter (Signed)
[facility] Night - Client Nonclinical Telephone Record  [facility] Client [name] Night - Client Client Site [place] Provider Waunita Schooner- MD Contact Type Call Who Is Calling Patient / Member / Family / Caregiver Caller Name [place] Phone Number [phone] Call Type Message Only Information Provided Reason for Call Returning a Call from the Office Initial Tiffany Zamora states that she was trying to return a call to the office. Additional Comment Caller would just like another call back since she just missed the call. She had called yesterday about a question and believes this is what the returned call is about. Disp. Time Disposition Final User 04/20/2022 1:53:18 PM General Information Provided Yes Tiffany Zamora Call Closed By: Tiffany Zamora Transaction Date/Time: 04/20/2022 1:51:19 PM (ET  Sending call to [place] [facility].

## 2022-04-20 NOTE — Telephone Encounter (Signed)
Unable to reach patient. Left voicemail to return call to our office.   

## 2022-04-25 NOTE — Telephone Encounter (Signed)
Sent a Mychart message to the patient.

## 2022-05-02 ENCOUNTER — Encounter: Payer: BC Managed Care – PPO | Admitting: Family

## 2022-05-19 ENCOUNTER — Encounter: Payer: BC Managed Care – PPO | Admitting: Family

## 2022-05-26 DIAGNOSIS — F4322 Adjustment disorder with anxiety: Secondary | ICD-10-CM | POA: Diagnosis not present

## 2022-05-30 ENCOUNTER — Ambulatory Visit: Payer: BC Managed Care – PPO | Admitting: Family

## 2022-05-30 ENCOUNTER — Encounter: Payer: Self-pay | Admitting: Family

## 2022-05-30 VITALS — BP 132/84 | HR 68 | Temp 97.6°F | Ht 66.0 in | Wt 163.2 lb

## 2022-05-30 DIAGNOSIS — R002 Palpitations: Secondary | ICD-10-CM | POA: Diagnosis not present

## 2022-05-30 DIAGNOSIS — F4322 Adjustment disorder with anxiety: Secondary | ICD-10-CM | POA: Diagnosis not present

## 2022-05-30 DIAGNOSIS — R0602 Shortness of breath: Secondary | ICD-10-CM | POA: Diagnosis not present

## 2022-05-30 DIAGNOSIS — M5442 Lumbago with sciatica, left side: Secondary | ICD-10-CM | POA: Insufficient documentation

## 2022-05-30 DIAGNOSIS — R232 Flushing: Secondary | ICD-10-CM | POA: Diagnosis not present

## 2022-05-30 DIAGNOSIS — R9431 Abnormal electrocardiogram [ECG] [EKG]: Secondary | ICD-10-CM

## 2022-05-30 DIAGNOSIS — R000 Tachycardia, unspecified: Secondary | ICD-10-CM | POA: Insufficient documentation

## 2022-05-30 DIAGNOSIS — M792 Neuralgia and neuritis, unspecified: Secondary | ICD-10-CM | POA: Diagnosis not present

## 2022-05-30 DIAGNOSIS — M5431 Sciatica, right side: Secondary | ICD-10-CM

## 2022-05-30 DIAGNOSIS — N912 Amenorrhea, unspecified: Secondary | ICD-10-CM

## 2022-05-30 DIAGNOSIS — M5441 Lumbago with sciatica, right side: Secondary | ICD-10-CM | POA: Insufficient documentation

## 2022-05-30 LAB — CBC
HCT: 40 % (ref 36.0–46.0)
Hemoglobin: 13.9 g/dL (ref 12.0–15.0)
MCHC: 34.7 g/dL (ref 30.0–36.0)
MCV: 93.3 fl (ref 78.0–100.0)
Platelets: 283 10*3/uL (ref 150.0–400.0)
RBC: 4.29 Mil/uL (ref 3.87–5.11)
RDW: 12.6 % (ref 11.5–15.5)
WBC: 7.5 10*3/uL (ref 4.0–10.5)

## 2022-05-30 LAB — BASIC METABOLIC PANEL
BUN: 16 mg/dL (ref 6–23)
CO2: 28 mEq/L (ref 19–32)
Calcium: 9.9 mg/dL (ref 8.4–10.5)
Chloride: 102 mEq/L (ref 96–112)
Creatinine, Ser: 0.7 mg/dL (ref 0.40–1.20)
GFR: 99.52 mL/min (ref >60.00)
Glucose, Bld: 89 mg/dL (ref 70–99)
Potassium: 4.4 mEq/L (ref 3.5–5.1)
Sodium: 138 mEq/L (ref 135–145)

## 2022-05-30 NOTE — Assessment & Plan Note (Addendum)
Sub acute  Will hold off on xray for now.  Ibuprofen/tylenol prn  Lidocaine patches prn  Work on exercises as tolerated.

## 2022-05-30 NOTE — Assessment & Plan Note (Addendum)
EKG today in office, NSR slight change from prior.  Referral placed to cardiologist, possible holter monitors as unremarkable findings on ekg Labs to r/o other etiologies such as hyperthyroid and or anemia or electrolyte abn Orthostatic vital signs assessed , wnl Borderline cardiomegaly - echo reassuring, mildly dilated LA (11/2017)

## 2022-05-30 NOTE — Patient Instructions (Addendum)
Welcome to our clinic, I am happy to have you as my new patient. I am excited to continue on this healthcare journey with you.  A referral was placed today to cardiology Please let us know if you have not heard back within 2 weeks about the referral.   ------------------------------------  Stop by the lab prior to leaving today. I will notify you of your results once received.   Please keep in mind Any my chart messages you send have up to a three business day turnaround for a response.  Phone calls may take up to a one full business day turnaround for a  response.   If you need a medication refill I recommend you request it through the pharmacy as this is easiest for [place] rather than sending a message and or phone call.   Due to recent changes in healthcare laws, you may see results of your imaging and/or laboratory studies on MyChart before I have had a chance to review them.  I understand that in some cases there may be results that are confusing or concerning to you. Please understand that not all results are received at the same time and often I may need to interpret multiple results in order to provide you with the best plan of care or course of treatment. Therefore, I ask that you please give me 2 business days to thoroughly review all your results before contacting my office for clarification. Should we see a critical lab result, you will be contacted sooner.   It was a pleasure seeing you today! Please do not hesitate to reach out with any questions and or concerns.  Regards,   Eugenia Pancoast FNP-C

## 2022-05-30 NOTE — Progress Notes (Signed)
Established Patient Office Visit  Subjective:   Patient ID: Tiffany Zamora, female    DOB: [birth year]  Age: 52 y.o. MRN: [id number]  CC:  Chief Complaint  Patient presents with   Irregular Heart Beat    Over 1 month    HPI: Tiffany Zamora is a 52 y.o. female presenting on 05/30/2022 for Irregular Heart Beat (Over 1 month)   HPI  Over the last month or so sleeping at night and she'll roll over and starts to feel her heart start to race. Denies chest pain. Does state slight shortness of breath sometimes, she feels this is anxiety provoked.   Also with her legs she will feel numbness down her thigh radiation to her leg, not usually at the same time (with both sides). Does have chronic low back pain. The leg pain is worse at nighttime but will happen during the day. Worse with lying down. Last week did feel some tingling/numbness down her legs which as since receded. She has taking tylenol pm at night but has not tried ibuprofen.   She does have increased stress with her child who was recently diagnosed with POTS, EDS, and also possible MCAS (pending immunology workup) , but over the last week has improved a bit and she has noticed the legs.    Only one dr pepper a day otherwise no increased caffeine, is also going through menopause.         ROS: Negative unless specifically indicated above in HPI.   Relevant past medical history reviewed and updated as indicated.   Allergies and medications reviewed and updated.   Current Outpatient Medications:    cetirizine (ZYRTEC) 10 MG tablet, Take 10 mg by mouth daily., Disp: , Rfl:    fluticasone (FLONASE) 50 MCG/ACT nasal spray, Place 2 sprays into both nostrils daily as needed for allergies or rhinitis., Disp: , Rfl:    OVER THE COUNTER MEDICATION, Melatonin ER 4 mg po qhs, Disp: , Rfl:   No Known Allergies  Objective:   BP 132/84 (BP Location: Left Arm, Patient Position: Standing, Cuff Size: Normal)   Pulse 68    Temp 97.6 F (36.4 C) (Temporal)   Ht 5\' 6"  (1.676 m)   Wt 163 lb 3.2 oz (74 kg)   SpO2 98%   BMI 26.34 kg/m    Physical Exam Constitutional:      General: She is not in acute distress.    Appearance: Normal appearance. She is normal weight. She is not ill-appearing, toxic-appearing or diaphoretic.  HENT:     Head: Normocephalic.  Cardiovascular:     Rate and Rhythm: Normal rate and regular rhythm.  Pulmonary:     Effort: Pulmonary effort is normal.  Musculoskeletal:        General: Normal range of motion.     Lumbar back: No spasms or tenderness. Normal range of motion. Negative right straight leg raise test and negative left straight leg raise test.  Neurological:     General: No focal deficit present.     Mental Status: She is alert and oriented to person, place, and time. Mental status is at baseline.  Psychiatric:        Mood and Affect: Mood normal.        Behavior: Behavior normal.        Thought Content: Thought content normal.        Judgment: Judgment normal.     Assessment & Plan:  Bilateral sciatica  Racing heart beat  Assessment & Plan: EKG today in office, NSR slight change from prior.  Referral placed to cardiologist, possible holter monitors as unremarkable findings on ekg Labs to r/o other etiologies such as hyperthyroid and or anemia or electrolyte abn Orthostatic vital signs assessed , wnl Borderline cardiomegaly - echo reassuring, mildly dilated LA (11/2017)   Orders: -     EKG 12-Lead -     TSH -     CBC -     Basic metabolic panel -     Vitamin B12  Acute bilateral low back pain with bilateral sciatica Assessment & Plan: Sub acute  Will hold off on xray for now.  Ibuprofen/tylenol prn  Lidocaine patches prn  Work on exercises as tolerated.     SOB (shortness of breath) -     D-dimer, quantitative  Neuralgia -     Vitamin B12  Palpitations -     Ambulatory referral to Cardiology  Abnormal EKG -     Ambulatory referral to  Cardiology  Hot flashes -     Follicle stimulating hormone -     Prolactin  Amenorrhea -     Follicle stimulating hormone -     Prolactin     Follow up plan: Return for as scheduled for TOC in april .  Eugenia Pancoast, FNP

## 2022-05-31 LAB — D-DIMER, QUANTITATIVE: D-Dimer, Quant: <0.19 mcg/mL FEU (ref <0.50)

## 2022-05-31 LAB — FOLLICLE STIMULATING HORMONE: FSH: 66.7 m[IU]/mL

## 2022-05-31 LAB — VITAMIN B12: Vitamin B-12: 115 pg/mL — ABNORMAL LOW (ref 211–911)

## 2022-05-31 LAB — PROLACTIN: Prolactin: 4.8 ng/mL

## 2022-05-31 LAB — TSH: TSH: 0.88 u[IU]/mL (ref 0.35–5.50)

## 2022-05-31 NOTE — Progress Notes (Signed)
Fsh showed postmenopausal range  B12 very low. Vitamin B12 very low, please set up for B12 injections, 1000 mcg IM once monthly for three months, also schedule 3 month f/u appt to repeat labs and f/u in office. Recommend also oral otc 1000 mcg once daily vitamin B12

## 2022-06-01 ENCOUNTER — Encounter: Payer: Self-pay | Admitting: Family

## 2022-06-08 ENCOUNTER — Ambulatory Visit (INDEPENDENT_AMBULATORY_CARE_PROVIDER_SITE_OTHER): Payer: BC Managed Care – PPO

## 2022-06-08 DIAGNOSIS — E538 Deficiency of other specified B group vitamins: Secondary | ICD-10-CM | POA: Diagnosis not present

## 2022-06-08 MED ORDER — CYANOCOBALAMIN 1000 MCG/ML IJ SOLN
1000.0000 ug | Freq: Once | INTRAMUSCULAR | Status: AC
  Administered 2022-06-08: 1000 ug via INTRAMUSCULAR

## 2022-06-08 NOTE — Progress Notes (Signed)
Patient presented for B 12 injection given by Julyssa Kyer, CMA to right deltoid, patient voiced no concerns nor showed any signs of distress during injection.  

## 2022-06-09 DIAGNOSIS — F4322 Adjustment disorder with anxiety: Secondary | ICD-10-CM | POA: Diagnosis not present

## 2022-06-27 ENCOUNTER — Encounter: Payer: Self-pay | Admitting: Cardiology

## 2022-06-27 ENCOUNTER — Ambulatory Visit: Payer: BC Managed Care – PPO | Attending: Cardiology | Admitting: Cardiology

## 2022-06-27 VITALS — BP 110/74 | HR 78 | Ht 66.0 in | Wt 170.0 lb

## 2022-06-27 DIAGNOSIS — R9431 Abnormal electrocardiogram [ECG] [EKG]: Secondary | ICD-10-CM

## 2022-06-27 DIAGNOSIS — R0602 Shortness of breath: Secondary | ICD-10-CM

## 2022-06-27 DIAGNOSIS — R000 Tachycardia, unspecified: Secondary | ICD-10-CM | POA: Diagnosis not present

## 2022-06-27 NOTE — Patient Instructions (Addendum)
Medication Instructions:    *If you need a refill on your cardiac medications before your next appointment, please call your pharmacy*   Lab Work: Not needed   Testing/Procedures: Not needed Continue to monitor your heart rate  with Your Apple watch   Follow-Up: At [facility], you and your health needs are our priority.  As part of our continuing mission to provide you with exceptional heart care, we have created designated Provider Care Teams.  These Care Teams include your primary Cardiologist (physician) and Advanced Practice Providers (APPs -  Physician Assistants and Nurse Practitioners) who all work together to provide you with the care you need, when you need it.     Your next appointment:   4 to 6 month(s)  The format for your next appointment:   Virtual Visit   Provider:   Bryan Lemma, MD    Other Instructions   Hydrate   Watch decrease stress   Limit caffeine

## 2022-06-27 NOTE — Progress Notes (Signed)
Primary Care Provider: Mort Sawyers, FNP [facility] Cardiologist: Bryan Lemma, MD Electrophysiologist: None  Clinic Note: No chief complaint on file.   ===================================  ASSESSMENT/PLAN   Problem List Items Addressed This Visit   None   ===================================  HPI:    Tiffany Zamora is a 52 y.o. female who is being seen today for the evaluation of *** at the request of Mort Sawyers, FNP.  Tiffany Zamora was last seen on ***  Recent Hospitalizations:  ER visit February 03, 2022-presented with intermittent dyspnea worse over the last week and a half.  I have been noticing the symptoms for several years.  Apparently seen by cardiology, pulmonary critical care with no results.  No dyspnea going up stairs.  She felt that she could not catch her breath.  Reviewed  CV studies:    The following studies were reviewed today: (if available, images/films reviewed: From Epic Chart or Care Everywhere) Echo 11/15/2017: Normal EF 55 to 60%.  No RWMA.  Mild LA dilation but normal diastolic parameters.  Normal RV.  Otherwise normal valves. PFTs 02/21/2018  Interval History:    Tiffany Zamora   CV Review of Symptoms (Summary): {[id number]}  REVIEWED OF SYSTEMS   ROS  I have reviewed and (if needed) personally updated the patient's problem list, medications, allergies, past medical and surgical history, social and family history.   PAST MEDICAL HISTORY   Past Medical History:  Diagnosis Date   Benign hematuria ~2005   per pt normal workup with cystoscopy [facility])   Heartburn    IBS (irritable bowel syndrome) 09/25/2015   Seasonal allergies    Urine incontinence     PAST SURGICAL HISTORY   Past Surgical History:  Procedure Laterality Date   COLONOSCOPY  2000   for IBS sxs, WNL per patient   COLONOSCOPY WITH PROPOFOL N/A 05/18/2020   Procedure: COLONOSCOPY WITH PROPOFOL;  Surgeon: Toney Reil, MD;   Location: [facility] ENDOSCOPY;  Service: Gastroenterology;  Laterality: N/A;   DILATION AND CURETTAGE OF UTERUS  08/2007   WISDOM TOOTH EXTRACTION  1993    Immunization History  Administered Date(s) Administered   Influenza,inj,Quad PF,6+ Mos 02/06/2018   PFIZER(Purple Top)SARS-COV-2 Vaccination 06/03/2019, 07/02/2019, 02/17/2020, 09/14/2020   Td 03/07/2004   Tdap 01/31/2017   Zoster Recombinat (Shingrix) 10/15/2021    MEDICATIONS/ALLERGIES   Current Meds  Medication Sig   cetirizine (ZYRTEC) 10 MG tablet Take 10 mg by mouth daily.   fluticasone (FLONASE) 50 MCG/ACT nasal spray Place 2 sprays into both nostrils daily as needed for allergies or rhinitis.   OVER THE COUNTER MEDICATION Melatonin ER 4 mg po qhs    No Known Allergies  SOCIAL HISTORY/FAMILY HISTORY   Reviewed in Epic:   Social History   Tobacco Use   Smoking status: Never   Smokeless tobacco: Never  Vaping Use   Vaping Use: Never used  Substance Use Topics   Alcohol use: Yes    Alcohol/week: 0.0 standard drinks of alcohol    Comment: a few times a week, 2 glasses   Drug use: No   Social History   Social History Narrative   Wife of Fayrene Fearing "Alycia Rossetti" [place] and daughter and dog   Occupation: stay at home mom   Edu: master's in criminology      05/23/19   From: [place], moved because her husband is from here   Living: with Husband, Alycia Rossetti and daughter and dog   Work: former Emergency planning/management officer, 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      Family: daughter - Danne Harbor (2007), in-laws nearby with a good relationship      Enjoys: hiking, going to the gym, biking, spend time with friends, photography things      Activity: exercises regularly 3-5x/wk   Diet: not so good, trying to change that      Safety   Seat belts: Yes    Guns: Yes  and secure   Safe in relationships: Yes    Family History  Problem Relation Age of Onset   Diabetes Sister    Heart attack Maternal Grandfather 70       MI   Hypertension Mother    Tongue cancer  Mother        prior smoker   Hypertension Father    Dementia Maternal Grandmother 95       died at 7   Cancer Neg Hx    Stroke Neg Hx     OBJCTIVE -PE, EKG, labs   Wt Readings from Last 3 Encounters:  06/27/22 170 lb (77.1 kg)  05/30/22 163 lb 3.2 oz (74 kg)  02/03/22 171 lb 1.2 oz (77.6 kg)    Physical Exam: BP 110/74 (BP Location: Left Arm, Patient Position: Sitting, Cuff Size: Normal)   Pulse 78   Ht  (1.676 m)   Wt 170 lb (77.1 kg)   SpO2 99%   BMI 27.44 kg/m  Physical Exam   Adult ECG Report  Rate: *** ;  Rhythm: {rhythm:17366};   Narrative Interpretation: ***  Recent Labs:  ***  Lab Results  Component Value Date   CHOL 190 10/15/2021   HDL 67.80 10/15/2021   LDLCALC 99 10/15/2021   LDLDIRECT 93.0 09/21/2015   TRIG 114.0 10/15/2021   CHOLHDL 3 10/15/2021   Lab Results  Component Value Date   CREATININE 0.70 05/30/2022   BUN 16 05/30/2022   NA 138 05/30/2022   K 4.4 05/30/2022   CL 102 05/30/2022   CO2 28 05/30/2022      Latest Ref Rng & Units 05/30/2022   12:13 PM 02/03/2022    3:02 PM 10/15/2021   12:15 PM  CBC  WBC 4.0 - 10.5 K/uL 7.5  6.3  6.6   Hemoglobin 12.0 - 15.0 g/dL 16.1  09.6  04.5   Hematocrit 36.0 - 46.0 % 40.0  39.9  38.6   Platelets 150.0 - 400.0 K/uL 283.0  308  290.0     No results found for: "HGBA1C" Lab Results  Component Value Date   TSH 0.88 05/30/2022    ================================================== I spent a total of *** minutes with the patient spent in direct patient consultation.  Additional time spent with chart review  / charting (studies, outside notes, etc): *** min Total Time: *** min  Current medicines are reviewed at length with the patient today.  (+/- concerns) ***  Notice: This dictation was prepared with Dragon dictation along with smart phrase technology. Any transcriptional errors that result from this process are unintentional and may not be corrected upon review.   Studies Ordered:   No orders of the defined types were placed in this encounter.  No orders of the defined types were placed in this encounter.   Patient Instructions / Medication Changes & Studies & Tests Ordered   There are no Patient Instructions on file for this visit.    Marykay Lex, MD, MS Bryan Lemma, M.D., M.S. Interventional Cardiologist  [facility]  Pager # [phone] Phone #  [address] [place], [place] [address]   Thank you for choosing [facility]!!

## 2022-06-29 ENCOUNTER — Encounter: Payer: Self-pay | Admitting: Cardiology

## 2022-06-29 DIAGNOSIS — R9431 Abnormal electrocardiogram [ECG] [EKG]: Secondary | ICD-10-CM | POA: Insufficient documentation

## 2022-06-29 NOTE — Assessment & Plan Note (Signed)
Reviewing EKGs dating back to July 2019 indicate that she is always have these poor R wave progression in the precordial leads with borderline Q waves that the computer reads as possible septal infarct.  The fact there is no wall motion abnormality on echo and she does not recall having had a heart attack or any MI symptoms, I suspect that this is probably just a nonspecific abnormality seen on her EKG.  It is not new.   Based on my assessment, I will think we need to check a 2D echo.

## 2022-06-29 NOTE — Assessment & Plan Note (Signed)
She has had chronic dyspnea now that may be worse over the last month or 2.  I think is probably related to all the stress that she is dealing with.  Things seem to settle down now she is feeling better.  She had PFTs checked by pulmonary medicine back in 2019-the read was pretty cryptic.  However in no further follow-up was done. Echocardiogram also was relatively noncontributory as far as determining the cause of dyspnea.  We can reassess her in about 4 to 6 months and see how she is doing.  If this is still an issue, I would potentially consider CPX

## 2022-06-29 NOTE — Assessment & Plan Note (Signed)
Episodes that she is having are not all that frequent.  I will think we capture thing on the monitor, but she could potentially check her Apple watch.  But she says that these episodes are so short-lived that she would not actually be able to actively capture them.  She did go back and try to discover them.  I probably would not stress about these short little episodes.  Sounds like she is probably having short little bursts of PAT.  Echocardiogram back in 2019.  Normal.  No significant left atrial dilation.  Her episodes do not long enough to consider A-fib. Plan is for her to monitor her rhythm on her Apple watch.  She said that since March is over, symptoms have improved and they are not happening frequently enough for a guaranteed finding on event monitor.

## 2022-06-30 DIAGNOSIS — F4322 Adjustment disorder with anxiety: Secondary | ICD-10-CM | POA: Diagnosis not present

## 2022-07-04 ENCOUNTER — Ambulatory Visit: Payer: BC Managed Care – PPO | Admitting: Family

## 2022-07-04 ENCOUNTER — Encounter: Payer: Self-pay | Admitting: Family

## 2022-07-04 VITALS — BP 118/64 | HR 78 | Temp 97.9°F | Ht 66.0 in | Wt 166.8 lb

## 2022-07-04 DIAGNOSIS — E538 Deficiency of other specified B group vitamins: Secondary | ICD-10-CM | POA: Insufficient documentation

## 2022-07-04 DIAGNOSIS — R000 Tachycardia, unspecified: Secondary | ICD-10-CM

## 2022-07-04 DIAGNOSIS — J301 Allergic rhinitis due to pollen: Secondary | ICD-10-CM | POA: Insufficient documentation

## 2022-07-04 DIAGNOSIS — K582 Mixed irritable bowel syndrome: Secondary | ICD-10-CM | POA: Diagnosis not present

## 2022-07-04 NOTE — Assessment & Plan Note (Signed)
Cont f/u with cardiologist as scheduled We will work on managing her anxiety and stress as able.

## 2022-07-04 NOTE — Assessment & Plan Note (Signed)
Continue b12 injections Continue b12 1000 mcg once daily otc

## 2022-07-04 NOTE — Progress Notes (Addendum)
Established Patient Office Visit  Subjective:  Patient ID: Tiffany Zamora, female    DOB: 19-May-1970  Age: 52 y.o. MRN: [id number]  CC:  Chief Complaint  Patient presents with   Establish Care    TOC/ would like physical    HPI  Tiffany Zamora is here for a transition of care visit.  Prior provider was: Dr. Gweneth Dimitri   Acute concerns:   Left lower abdominal pain that comes and goes, when it occurs it feels dull and aching. At times will be daily and off and on during the day, will go away at times for a few days but ongoing. Has been over the last few years. Colonoscopy 2022 unremarkable. Amenorrhea since 01/2022, not quite a year with menopause.  Did not notice the pain during periods of ovulation. Has never paid attention to food intake during this. Denies change in BM's but since taking B12 the stools have been more normal, no longer with diarrhea. At times will have mucous in her stools.   At times will feel water come out of her ears, and feels it trickle down her ear canal.  She has clear fluid from her ear.  She states has been having this for about ten years. Will wake her up at times.  Chronic tinnititis, did have workup about twenty years ago. Constant and states has gotten a bit worse over the years.   chronic concerns:  Vitamin b12 def: started with injections and states that her sciatica has improved significantly if not completely resolved. She is still taking b12 1000 mcg once daily.   Wt Readings from Last 3 Encounters:  07/04/22 166 lb 12.8 oz (75.7 kg)  06/27/22 170 lb (77.1 kg)  05/30/22 163 lb 3.2 oz (74 kg)   has recently see cardiology for a workup on her racing heart beat.  He reviewed her hx of EKGs from 2019 Echocardiogram, no sig left atrial dilation.  He states if ongoing symptoms, will consider stress test.  He stated likely ddx anxiety and stress.      Past Medical History:  Diagnosis Date   Benign hematuria ~2005   per pt  normal workup with cystoscopy [facility])   Heartburn    IBS (irritable bowel syndrome) 09/25/2015   Seasonal allergies    Urine incontinence     Past Surgical History:  Procedure Laterality Date   COLONOSCOPY  2000   for IBS sxs, WNL per patient   COLONOSCOPY WITH PROPOFOL N/A 05/18/2020   Procedure: COLONOSCOPY WITH PROPOFOL;  Surgeon: Toney Reil, MD;  Location: [facility] ENDOSCOPY;  Service: Gastroenterology;  Laterality: N/A;   DILATION AND CURETTAGE OF UTERUS  08/2007   WISDOM TOOTH EXTRACTION  1993    Family History  Problem Relation Age of Onset   Diabetes Sister    Heart attack Maternal Grandfather 19       MI   Hypertension Mother    Tongue cancer Mother        prior smoker   Hypertension Father    Dementia Maternal Grandmother 95       died at 102   Cancer Neg Hx    Stroke Neg Hx     Social History   Socioeconomic History   Marital status: Married    Spouse name: Alycia Rossetti   Number of children: 1   Years of education: Masters   Highest education level: Not on file  Occupational History   Occupation: stay at home  Tobacco Use  Smoking status: Never   Smokeless tobacco: Never  Vaping Use   Vaping Use: Never used  Substance and Sexual Activity   Alcohol use: Yes    Alcohol/week: 0.0 standard drinks of alcohol    Comment: a few times a week, 2 glasses   Drug use: No   Sexual activity: Yes    Partners: Male    Birth control/protection: Pill  Other Topics Concern   Not on file  Social History Narrative   Wife of Fayrene Fearing "Alycia Rossetti" [place] and daughter and dog   Occupation: stay at home mom   Edu: master's in criminology      05/23/19   From: [place], moved because her husband is from here   Living: with Husband, Alycia Rossetti and daughter and dog   Work: former Emergency planning/management officer, 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      Family: daughter Danne Harbor (2007), in-laws nearby with a good relationship      Enjoys: hiking, going to the gym, biking, spend time with friends, photography things       Activity: exercises regularly 3-5x/wk   Diet: not so good, trying to change that      Safety   Seat belts: Yes    Guns: Yes  and secure   Safe in relationships: Yes    Social Determinants of Health   Financial Resource Strain: Not on file  Food Insecurity: Not on file  Transportation Needs: Not on file  Physical Activity: Not on file  Stress: Not on file  Social Connections: Not on file  Intimate Partner Violence: Not on file    Outpatient Medications Prior to Visit  Medication Sig Dispense Refill   cetirizine (ZYRTEC) 10 MG tablet Take 10 mg by mouth daily.     fluticasone (FLONASE) 50 MCG/ACT nasal spray Place 2 sprays into both nostrils daily as needed for allergies or rhinitis.     OVER THE COUNTER MEDICATION Melatonin ER 4 mg po qhs     No facility-administered medications prior to visit.    No Known Allergies  ROS: Pertinent symptoms negative unless otherwise noted in HPI      Objective:    Physical Exam Vitals reviewed.  Constitutional:      Appearance: Normal appearance.  Eyes:     General:        Right eye: No discharge.        Left eye: No discharge.     Conjunctiva/sclera: Conjunctivae normal.  Cardiovascular:     Rate and Rhythm: Normal rate and regular rhythm.  Pulmonary:     Effort: Pulmonary effort is normal. No respiratory distress.     Breath sounds: Normal breath sounds.  Abdominal:     General: Bowel sounds are increased.     Comments: Gassy sounds all quadrants  Musculoskeletal:        General: Normal range of motion.     Cervical back: Normal range of motion.  Neurological:     General: No focal deficit present.     Mental Status: She is alert and oriented to person, place, and time. Mental status is at baseline.  Psychiatric:        Mood and Affect: Mood normal.        Behavior: Behavior normal.        Thought Content: Thought content normal.        Judgment: Judgment normal.       BP 118/64   Pulse 78   Temp 97.9 F  (36.6  C) (Temporal)   Ht 5\' 6"  (1.676 m)   Wt 166 lb 12.8 oz (75.7 kg)   SpO2 98%   BMI 26.92 kg/m  Wt Readings from Last 3 Encounters:  07/04/22 166 lb 12.8 oz (75.7 kg)  06/27/22 170 lb (77.1 kg)  05/30/22 163 lb 3.2 oz (74 kg)     There are no preventive care reminders to display for this patient.  There are no preventive care reminders to display for this patient.  Lab Results  Component Value Date   TSH 0.88 05/30/2022   Lab Results  Component Value Date   WBC 7.5 05/30/2022   HGB 13.9 05/30/2022   HCT 40.0 05/30/2022   MCV 93.3 05/30/2022   PLT 283.0 05/30/2022   Lab Results  Component Value Date   NA 138 05/30/2022   K 4.4 05/30/2022   CO2 28 05/30/2022   GLUCOSE 89 05/30/2022   BUN 16 05/30/2022   CREATININE 0.70 05/30/2022   BILITOT 0.6 10/15/2021   ALKPHOS 75 10/15/2021   AST 19 10/15/2021   ALT 19 10/15/2021   PROT 7.0 10/15/2021   ALBUMIN 4.3 10/15/2021   CALCIUM 9.9 05/30/2022   ANIONGAP 6 02/03/2022   GFR 99.52 05/30/2022   Lab Results  Component Value Date   CHOL 190 10/15/2021   Lab Results  Component Value Date   HDL 67.80 10/15/2021   Lab Results  Component Value Date   LDLCALC 99 10/15/2021   Lab Results  Component Value Date   TRIG 114.0 10/15/2021   Lab Results  Component Value Date   CHOLHDL 3 10/15/2021   No results found for: "HGBA1C"    Assessment & Plan:   Racing heart beat Assessment & Plan: Cont f/u with cardiologist as scheduled We will work on managing her anxiety and stress as able.    Seasonal allergic rhinitis due to pollen Assessment & Plan: Recommendation for zyrtec nightly.  Do suspect that fluid coming out of ears occasional at night is due to post shower catching in the ears and draining out once lying on that side.    Irritable bowel syndrome with both constipation and diarrhea Assessment & Plan: D/w pt fodmap diet and keeping a food diary to see what exacerbates symptoms.  Suspect this may  be r/t llq abdominal pain that is intermittent.  If workup negative for this will consider u/s pelvis/transvaginal for chronic lower abd pain.   Vitamin B12 deficiency Assessment & Plan: Continue b12 injections Continue b12 1000 mcg once daily otc     No orders of the defined types were placed in this encounter.   Follow-up: Return in about 6 months (around 01/03/2023) for f/u CPE.    Mort Sawyers, FNP

## 2022-07-04 NOTE — Patient Instructions (Addendum)
   I do recommend nightly allergy medication to help with fluid on the right side of the ear.   Regards,   Mort Sawyers FNP-C

## 2022-07-04 NOTE — Assessment & Plan Note (Addendum)
D/w pt fodmap diet and keeping a food diary to see what exacerbates symptoms.  Suspect this may be r/t llq abdominal pain that is intermittent.  If workup negative for this will consider u/s pelvis/transvaginal for chronic lower abd pain.

## 2022-07-04 NOTE — Assessment & Plan Note (Signed)
Recommendation for zyrtec nightly.  Do suspect that fluid coming out of ears occasional at night is due to post shower catching in the ears and draining out once lying on that side.

## 2022-07-14 ENCOUNTER — Ambulatory Visit (INDEPENDENT_AMBULATORY_CARE_PROVIDER_SITE_OTHER): Payer: BC Managed Care – PPO

## 2022-07-14 DIAGNOSIS — E538 Deficiency of other specified B group vitamins: Secondary | ICD-10-CM

## 2022-07-14 MED ORDER — CYANOCOBALAMIN 1000 MCG/ML IJ SOLN
1000.0000 ug | Freq: Once | INTRAMUSCULAR | Status: AC
  Administered 2022-07-14: 1000 ug via INTRAMUSCULAR

## 2022-07-14 NOTE — Progress Notes (Signed)
Per orders of  Tabitha Dugal FNP, injection of 2nd Vitamin B 12 injection in left deltoid given by Lewanda Rife. Patient tolerated injection well. Pt will schedule next monthly nurse visit for B 12 injection.

## 2022-08-04 ENCOUNTER — Ambulatory Visit: Payer: BC Managed Care – PPO | Admitting: Family

## 2022-08-04 ENCOUNTER — Encounter: Payer: Self-pay | Admitting: Family

## 2022-08-04 VITALS — BP 122/70 | HR 56 | Temp 97.6°F | Ht 66.0 in | Wt 166.1 lb

## 2022-08-04 DIAGNOSIS — N3001 Acute cystitis with hematuria: Secondary | ICD-10-CM | POA: Insufficient documentation

## 2022-08-04 DIAGNOSIS — R350 Frequency of micturition: Secondary | ICD-10-CM

## 2022-08-04 DIAGNOSIS — R82998 Other abnormal findings in urine: Secondary | ICD-10-CM | POA: Diagnosis not present

## 2022-08-04 LAB — POC URINALSYSI DIPSTICK (AUTOMATED)
Bilirubin, UA: NEGATIVE
Blood, UA: 200 — AB
Glucose, UA: NEGATIVE
Ketones, UA: NEGATIVE
Nitrite, UA: NEGATIVE
Protein, UA: POSITIVE — AB
Spec Grav, UA: 1.025 (ref 1.010–1.025)
Urobilinogen, UA: 0.2 E.U./dL
pH, UA: 6 (ref 5.0–8.0)

## 2022-08-04 MED ORDER — SULFAMETHOXAZOLE-TRIMETHOPRIM 800-160 MG PO TABS: 1.0000 | ORAL_TABLET | Freq: Two times a day (BID) | ORAL | 0 refills | Status: AC

## 2022-08-04 NOTE — Assessment & Plan Note (Signed)
poct urine dip in office Urine culture ordered pending results antbx sent to pharmacy, pt to take as directed. Encouraged increased water intake throughout the day. Choosing to treat due to being symptomatic. If no improvement in the next 2 days pt advised to let me know.  

## 2022-08-04 NOTE — Progress Notes (Signed)
Established Patient Office Visit  Subjective:   Patient ID: Tiffany Zamora, female    DOB: March 19, 1970  Age: 52 y.o. MRN: [id number]  CC:  Chief Complaint  Patient presents with   Possible UTI    Frequency, pressure, urgency, retention     HPI: Tiffany Zamora is a 52 y.o. female presenting on 08/04/2022 for Possible UTI (Frequency, pressure, urgency, retention )   HPI  Two days ago started with dysuria, frequency, urgency and also retention. No back pain no chills no fever.   She has had urine symptoms in the past, saw urologist and negative cystocopy.         ROS: Negative unless specifically indicated above in HPI.   Relevant past medical history reviewed and updated as indicated.   Allergies and medications reviewed and updated.   Current Outpatient Medications:    cetirizine (ZYRTEC) 10 MG tablet, Take 10 mg by mouth daily., Disp: , Rfl:    Cyanocobalamin (B-12) 1000 MCG TABS, Take 1-2 tablets by mouth daily., Disp: , Rfl:    fluticasone (FLONASE) 50 MCG/ACT nasal spray, Place 2 sprays into both nostrils daily as needed for allergies or rhinitis., Disp: , Rfl:    OVER THE COUNTER MEDICATION, Melatonin ER 4 mg po qhs, Disp: , Rfl:    sulfamethoxazole-trimethoprim (BACTRIM DS) 800-160 MG tablet, Take 1 tablet by mouth 2 (two) times daily for 7 days., Disp: 14 tablet, Rfl: 0  No Known Allergies  Objective:   BP 122/70 (BP Location: Right Arm, Patient Position: Sitting, Cuff Size: Normal)   Pulse (!) 56   Temp 97.6 F (36.4 C) (Temporal)   Ht 5\' 6"  (1.676 m)   Wt 166 lb 2 oz (75.4 kg)   LMP 02/02/2022 (Exact Date)   SpO2 100%   BMI 26.81 kg/m    Physical Exam Constitutional:      General: She is not in acute distress.    Appearance: Normal appearance. She is normal weight. She is not ill-appearing, toxic-appearing or diaphoretic.  Cardiovascular:     Rate and Rhythm: Normal rate and regular rhythm.  Pulmonary:     Effort: Pulmonary effort is  normal.  Abdominal:     General: Abdomen is flat.     Tenderness: There is no abdominal tenderness. There is no right CVA tenderness or left CVA tenderness.  Neurological:     General: No focal deficit present.     Mental Status: She is alert and oriented to person, place, and time. Mental status is at baseline.     Motor: No weakness.  Psychiatric:        Mood and Affect: Mood normal.        Behavior: Behavior normal.        Thought Content: Thought content normal.        Judgment: Judgment normal.     Assessment & Plan:  Urinary frequency -     POCT Urinalysis Dipstick (Automated) -     Sulfamethoxazole-Trimethoprim; Take 1 tablet by mouth 2 (two) times daily for 7 days.  Dispense: 14 tablet; Refill: 0  Acute cystitis with hematuria Assessment & Plan: poct urine dip in office Urine culture ordered pending results antbx sent to pharmacy, pt to take as directed. Encouraged increased water intake throughout the day. Choosing to treat due to being symptomatic. If no improvement in the next 2 days pt advised to let me know.   Orders: -     Sulfamethoxazole-Trimethoprim; Take 1 tablet by mouth  2 (two) times daily for 7 days.  Dispense: 14 tablet; Refill: 0 -     Urinalysis w microscopic + reflex cultur  Leukocytes in urine     Follow up plan: Return if symptoms worsen or fail to improve.  Mort Sawyers, FNP

## 2022-08-05 ENCOUNTER — Encounter: Payer: Self-pay | Admitting: Family

## 2022-08-05 DIAGNOSIS — R350 Frequency of micturition: Secondary | ICD-10-CM

## 2022-08-05 DIAGNOSIS — N3001 Acute cystitis with hematuria: Secondary | ICD-10-CM

## 2022-08-05 MED ORDER — NITROFURANTOIN MONOHYD MACRO 100 MG PO CAPS: 100.0000 mg | ORAL_CAPSULE | Freq: Two times a day (BID) | ORAL | 0 refills | Status: AC

## 2022-08-05 MED ORDER — FLUCONAZOLE 150 MG PO TABS: 150.0000 mg | ORAL_TABLET | Freq: Once | ORAL | 0 refills | Status: AC

## 2022-08-05 NOTE — Telephone Encounter (Signed)
Called patient states that headaches started yesterday about 4 hours after taking the medication. She does not get frequent headaches at all. Headache is only getting worse. She states also having some GI issues. She was started on it for UTI symptoms. Has noticed some improvement in burning and frequency of urine.  She also started having vaginal itching today around 11:30. Denies any discharge or odor.   Patient would like [facility] [address].

## 2022-08-06 LAB — CULTURE INDICATED

## 2022-08-06 LAB — URINALYSIS W MICROSCOPIC + REFLEX CULTURE
Bilirubin Urine: NEGATIVE
Glucose, UA: NEGATIVE
Hyaline Cast: NONE SEEN /LPF
Ketones, ur: NEGATIVE
Nitrites, Initial: NEGATIVE
Protein, ur: NEGATIVE
Specific Gravity, Urine: 1.015 (ref 1.001–1.035)
pH: 5.5 (ref 5.0–8.0)

## 2022-08-06 LAB — URINE CULTURE
MICRO NUMBER:: 15022873
SPECIMEN QUALITY:: ADEQUATE

## 2022-08-08 ENCOUNTER — Encounter: Payer: Self-pay | Admitting: Family

## 2022-08-25 ENCOUNTER — Ambulatory Visit (INDEPENDENT_AMBULATORY_CARE_PROVIDER_SITE_OTHER): Payer: BC Managed Care – PPO

## 2022-08-25 DIAGNOSIS — E538 Deficiency of other specified B group vitamins: Secondary | ICD-10-CM | POA: Diagnosis not present

## 2022-08-25 MED ORDER — CYANOCOBALAMIN 1000 MCG/ML IJ SOLN
1000.0000 ug | Freq: Once | INTRAMUSCULAR | Status: AC
  Administered 2022-08-25: 1000 ug via INTRAMUSCULAR

## 2022-08-25 NOTE — Progress Notes (Signed)
Per orders of Mort Sawyers, NP, injection of vitamin b 12 given by Lewanda Rife in right deltoid. Patient tolerated injection well. Patient said already has fu appt with Mort Sawyers FNP.

## 2022-09-01 ENCOUNTER — Ambulatory Visit: Payer: BC Managed Care – PPO | Admitting: Family

## 2022-10-05 ENCOUNTER — Encounter (INDEPENDENT_AMBULATORY_CARE_PROVIDER_SITE_OTHER): Payer: Self-pay

## 2022-10-26 ENCOUNTER — Ambulatory Visit: Payer: BC Managed Care – PPO | Admitting: Family

## 2022-10-26 ENCOUNTER — Encounter: Payer: Self-pay | Admitting: Family

## 2022-10-26 VITALS — BP 124/80 | HR 63 | Temp 97.9°F | Ht 66.0 in | Wt 171.2 lb

## 2022-10-26 DIAGNOSIS — E663 Overweight: Secondary | ICD-10-CM | POA: Diagnosis not present

## 2022-10-26 DIAGNOSIS — E781 Pure hyperglyceridemia: Secondary | ICD-10-CM | POA: Diagnosis not present

## 2022-10-26 DIAGNOSIS — Z0000 Encounter for general adult medical examination without abnormal findings: Secondary | ICD-10-CM | POA: Diagnosis not present

## 2022-10-26 DIAGNOSIS — G479 Sleep disorder, unspecified: Secondary | ICD-10-CM | POA: Diagnosis not present

## 2022-10-26 DIAGNOSIS — H9313 Tinnitus, bilateral: Secondary | ICD-10-CM

## 2022-10-26 DIAGNOSIS — E538 Deficiency of other specified B group vitamins: Secondary | ICD-10-CM | POA: Diagnosis not present

## 2022-10-26 LAB — LIPID PANEL
Cholesterol: 194 mg/dL (ref 0–200)
HDL: 65 mg/dL (ref >39.00)
LDL Cholesterol: 102 mg/dL — ABNORMAL HIGH (ref 0–99)
NonHDL: 128.5
Total CHOL/HDL Ratio: 3
Triglycerides: 131 mg/dL (ref 0.0–149.0)
VLDL: 26.2 mg/dL (ref 0.0–40.0)

## 2022-10-26 NOTE — Progress Notes (Signed)
Subjective:  Patient ID: Tiffany Zamora, female    DOB: 11-24-1970  Age: 52 y.o. MRN: [id number]  Patient Care Team: Mort Sawyers, FNP as PCP - General (Family Medicine) Marykay Lex, MD as PCP - Cardiology (Cardiology)   CC:  Chief Complaint  Patient presents with   Annual Exam    Pt here for Annual Exam and is currently fasting     HPI Tiffany Zamora is a 52 y.o. female who presents today for an annual physical exam. She reports consuming a general diet.  Spin class  She generally feels well. She reports sleeping fairly well. She does have additional problems to discuss today.   Vision:Within last year Dental:Receives regular dental care STD:The patient denies history of sexually transmitted disease.  Amenorrhea: stopped her period dec 2023. Has not had since.  Intermittent hot flashes.   Mammogram: due August 2024 Last pap: 01/15/2019 due again in five years Colonoscopy: due 2032 Shingles vaccination: [facility]   Pt is with acute concerns.  Sleep disorder. Over the last few months hard to go back to sleep after waking up. She has been using melatonin at 4 mg once nightly and now isn't working as well as it used to be. She is curious if she can increase to 8 mg.   Bil tinnitus, recently worsening.  Does state h/o being around loud music and shooting guns but never had evaluated.   Advanced Directives Patient does not have advanced directives    DEPRESSION SCREENING    10/26/2022   11:01 AM 08/04/2022   10:27 AM 07/04/2022   12:05 PM 05/30/2022   12:31 PM 10/15/2021   12:02 PM 05/23/2019    3:57 PM 02/06/2018    3:19 PM  PHQ 2/9 Scores  PHQ - 2 Score 0 0 0 1 0 0 0  PHQ- 9 Score  1          ROS: Negative unless specifically indicated above in HPI.    Current Outpatient Medications:    melatonin 3 MG TABS tablet, Take 3 mg by mouth at bedtime., Disp: , Rfl:    cetirizine (ZYRTEC) 10 MG tablet, Take 10 mg by mouth daily.,  Disp: , Rfl:    Cholecalciferol 1.25 MG (50000 UT) TABS, Take 1 tablet by mouth once a week., Disp: 8 tablet, Rfl: 0   Cyanocobalamin (B-12) 1000 MCG TABS, Take 1-2 tablets by mouth daily., Disp: , Rfl:    fluticasone (FLONASE) 50 MCG/ACT nasal spray, Place 2 sprays into both nostrils daily as needed for allergies or rhinitis., Disp: , Rfl:     Objective:    BP 124/80   Pulse 63   Temp 97.9 F (36.6 C)   Ht 5\' 6"  (1.676 m)   Wt 171 lb 3.2 oz (77.7 kg)   LMP 02/02/2022 (Exact Date)   SpO2 99%   BMI 27.63 kg/m   BP Readings from Last 3 Encounters:  10/26/22 124/80  08/04/22 122/70  07/04/22 118/64      Physical Exam Constitutional:      General: She is not in acute distress.    Appearance: Normal appearance. She is normal weight. She is not ill-appearing.  HENT:     Head: Normocephalic.     Right Ear: Tympanic membrane normal.     Left Ear: Tympanic membrane normal.     Nose: Nose normal.     Mouth/Throat:     Mouth: Mucous membranes are moist.  Eyes:  Extraocular Movements: Extraocular movements intact.     Pupils: Pupils are equal, round, and reactive to light.  Cardiovascular:     Rate and Rhythm: Normal rate and regular rhythm.  Pulmonary:     Effort: Pulmonary effort is normal.     Breath sounds: Normal breath sounds.  Abdominal:     General: Abdomen is flat. Bowel sounds are normal.     Palpations: Abdomen is soft.     Tenderness: There is no guarding or rebound.  Musculoskeletal:        General: Normal range of motion.     Cervical back: Normal range of motion.  Skin:    General: Skin is warm.     Capillary Refill: Capillary refill takes less than 2 seconds.  Neurological:     General: No focal deficit present.     Mental Status: She is alert.  Psychiatric:        Mood and Affect: Mood normal.        Behavior: Behavior normal.        Thought Content: Thought content normal.        Judgment: Judgment normal.          Assessment & Plan:   Sleep disorder Assessment & Plan: Increase to 8 mg otc melatonin, max dosage 10 mg d/w patient    Orders: -     VITAMIN D 25 Hydroxy (Vit-D Deficiency, Fractures)  Vitamin B12 deficiency -     B12 and Folate Panel  Hypertriglyceridemia Assessment & Plan: Ordered lipid panel, pending results. Work on low cholesterol diet and exercise as tolerated   Orders: -     Lipid panel  Encounter for general adult medical examination without abnormal findings Assessment & Plan: Patient Counseling(The following topics were reviewed):  Preventative care handout given to pt  Health maintenance and immunizations reviewed. Please refer to Health maintenance section. Pt advised on safe sex, wearing seatbelts in car, and proper nutrition labwork ordered today for annual Dental health: Discussed importance of regular tooth brushing, flossing, and dental visits.    Bilateral tinnitus Assessment & Plan: Chronic, worsening Referral placed for ent  Orders: -     Ambulatory referral to ENT  Overweight -     VITAMIN D 25 Hydroxy (Vit-D Deficiency, Fractures)      Follow-up: Return in about 1 year (around 10/26/2023) for f/u CPE.   Mort Sawyers, FNP

## 2022-10-26 NOTE — Assessment & Plan Note (Signed)
Increase to 8 mg otc melatonin, max dosage 10 mg d/w patient

## 2022-10-27 ENCOUNTER — Encounter: Payer: Self-pay | Admitting: *Deleted

## 2022-10-28 ENCOUNTER — Other Ambulatory Visit: Payer: Self-pay | Admitting: Obstetrics and Gynecology

## 2022-10-28 DIAGNOSIS — Z1231 Encounter for screening mammogram for malignant neoplasm of breast: Secondary | ICD-10-CM

## 2022-10-28 LAB — B12 AND FOLATE PANEL
Folate: 11.6 ng/mL (ref >5.9)
Vitamin B-12: 714 pg/mL (ref 211–911)

## 2022-10-28 LAB — VITAMIN D 25 HYDROXY (VIT D DEFICIENCY, FRACTURES): VITD: 25.83 ng/mL — ABNORMAL LOW (ref 30.00–100.00)

## 2022-10-31 ENCOUNTER — Encounter: Payer: Self-pay | Admitting: Family

## 2022-10-31 DIAGNOSIS — H9313 Tinnitus, bilateral: Secondary | ICD-10-CM | POA: Insufficient documentation

## 2022-10-31 DIAGNOSIS — E559 Vitamin D deficiency, unspecified: Secondary | ICD-10-CM

## 2022-10-31 DIAGNOSIS — Z0000 Encounter for general adult medical examination without abnormal findings: Secondary | ICD-10-CM | POA: Insufficient documentation

## 2022-10-31 MED ORDER — CHOLECALCIFEROL 1.25 MG (50000 UT) PO TABS: 1.0000 | ORAL_TABLET | ORAL | 0 refills | Status: DC

## 2022-10-31 NOTE — Assessment & Plan Note (Signed)
Chronic, worsening Referral placed for ent

## 2022-10-31 NOTE — Assessment & Plan Note (Signed)
Ordered lipid panel, pending results. Work on low cholesterol diet and exercise as tolerated  

## 2022-10-31 NOTE — Assessment & Plan Note (Signed)

## 2022-11-03 ENCOUNTER — Ambulatory Visit
Admission: RE | Admit: 2022-11-03 | Discharge: 2022-11-03 | Disposition: A | Discharge status: Home / Self Care | Payer: BC Managed Care – PPO | Source: Ambulatory Visit | Attending: Obstetrics and Gynecology | Admitting: Obstetrics and Gynecology

## 2022-11-03 ENCOUNTER — Ambulatory Visit: Payer: BC Managed Care – PPO

## 2022-11-03 DIAGNOSIS — Z1231 Encounter for screening mammogram for malignant neoplasm of breast: Secondary | ICD-10-CM | POA: Diagnosis not present

## 2022-11-04 ENCOUNTER — Encounter: Payer: BC Managed Care – PPO | Admitting: Family

## 2022-11-21 ENCOUNTER — Encounter: Payer: Self-pay | Admitting: Family Medicine

## 2022-11-21 ENCOUNTER — Ambulatory Visit: Payer: BC Managed Care – PPO | Admitting: Family Medicine

## 2022-11-21 VITALS — BP 118/72 | HR 67 | Temp 98.1°F | Ht 66.0 in | Wt 172.2 lb

## 2022-11-21 DIAGNOSIS — R350 Frequency of micturition: Secondary | ICD-10-CM | POA: Insufficient documentation

## 2022-11-21 DIAGNOSIS — R3915 Urgency of urination: Secondary | ICD-10-CM

## 2022-11-21 DIAGNOSIS — H9313 Tinnitus, bilateral: Secondary | ICD-10-CM | POA: Diagnosis not present

## 2022-11-21 LAB — POC URINALSYSI DIPSTICK (AUTOMATED)
Bilirubin, UA: NEGATIVE
Glucose, UA: NEGATIVE
Ketones, UA: NEGATIVE
Leukocytes, UA: NEGATIVE
Nitrite, UA: NEGATIVE
Protein, UA: NEGATIVE
Spec Grav, UA: 1.025 (ref 1.010–1.025)
Urobilinogen, UA: 0.2 U/dL
pH, UA: 6.5 (ref 5.0–8.0)

## 2022-11-21 MED ORDER — NITROFURANTOIN MONOHYD MACRO 100 MG PO CAPS: 100.0000 mg | ORAL_CAPSULE | Freq: Two times a day (BID) | ORAL | 0 refills | Status: DC

## 2022-11-21 NOTE — Progress Notes (Unsigned)
  Marikay Alar, MD Phone: [phone]  Tiffany Zamora is a 52 y.o. female who presents today for same-day visit.  Urine frequency: Patient notes onset of urine frequency last Thursday.  Also has urinary urgency.  No dysuria though she does have some suprapubic discomfort.  No hematuria or vaginal discharge.  Reports being treated for a UTI with Bactrim several months ago and notes the Bactrim made her feel funky.  Social History   Tobacco Use  Smoking Status Never  Smokeless Tobacco Never    Current Outpatient Medications on File Prior to Visit  Medication Sig Dispense Refill   cetirizine (ZYRTEC) 10 MG tablet Take 10 mg by mouth daily.     Cholecalciferol 1.25 MG (50000 UT) TABS Take 1 tablet by mouth once a week. 8 tablet 0   Cyanocobalamin (B-12) 1000 MCG TABS Take 1-2 tablets by mouth daily.     fluticasone (FLONASE) 50 MCG/ACT nasal spray Place 2 sprays into both nostrils daily as needed for allergies or rhinitis.     melatonin 3 MG TABS tablet Take 3 mg by mouth at bedtime.     No current facility-administered medications on file prior to visit.     ROS see history of present illness  Objective  Physical Exam Vitals:   11/21/22 1601  BP: 118/72  Pulse: 67  Temp: 98.1 F (36.7 C)  SpO2: 98%    BP Readings from Last 3 Encounters:  11/21/22 118/72  10/26/22 124/80  08/04/22 122/70   Wt Readings from Last 3 Encounters:  11/21/22 172 lb 3.2 oz (78.1 kg)  10/26/22 171 lb 3.2 oz (77.7 kg)  08/04/22 166 lb 2 oz (75.4 kg)    Physical Exam Abdominal:     General: Bowel sounds are normal. There is no distension.     Palpations: Abdomen is soft.     Tenderness: There is abdominal tenderness (Slight suprapubic discomfort on palpation).      Assessment/Plan: Please see individual problem list.  Frequency of urination Assessment & Plan: Recent onset issue concerning for UTI.  Urine dipstick does have moderate hemoglobin.  We will send urine for culture  microscopy.  We will start on Macrobid 100 mg twice daily for 5 days.  Will contact the patient with her culture and microscopy results.  Discussed if she starts to have more frequent UTIs she may need to discuss this with her urologist.  Orders: -     POCT Urinalysis Dipstick (Automated) -     Urine Culture -     Urine Microscopic -     Nitrofurantoin Monohyd Macro; Take 1 capsule (100 mg total) by mouth 2 (two) times daily.  Dispense: 10 capsule; Refill: 0  Urgency of urination -     POCT Urinalysis Dipstick (Automated) -     Urine Culture -     Urine Microscopic     Return if symptoms worsen or fail to improve.   Marikay Alar, MD [facility]

## 2022-11-21 NOTE — Assessment & Plan Note (Signed)
Recent onset issue concerning for UTI.  Urine dipstick does have moderate hemoglobin.  We will send urine for culture microscopy.  We will start on Macrobid 100 mg twice daily for 5 days.  Will contact the patient with her culture and microscopy results.  Discussed if she starts to have more frequent UTIs she may need to discuss this with her urologist.

## 2022-11-22 LAB — URINALYSIS, MICROSCOPIC ONLY

## 2022-11-23 DIAGNOSIS — M62830 Muscle spasm of back: Secondary | ICD-10-CM | POA: Diagnosis not present

## 2022-11-23 DIAGNOSIS — M9903 Segmental and somatic dysfunction of lumbar region: Secondary | ICD-10-CM | POA: Diagnosis not present

## 2022-11-23 DIAGNOSIS — M9905 Segmental and somatic dysfunction of pelvic region: Secondary | ICD-10-CM | POA: Diagnosis not present

## 2022-11-23 DIAGNOSIS — M545 Low back pain, unspecified: Secondary | ICD-10-CM | POA: Diagnosis not present

## 2022-11-24 LAB — URINE CULTURE
MICRO NUMBER:: 15471306
SPECIMEN QUALITY:: ADEQUATE

## 2022-11-28 ENCOUNTER — Telehealth: Payer: Self-pay | Admitting: Family

## 2022-11-28 DIAGNOSIS — M62830 Muscle spasm of back: Secondary | ICD-10-CM | POA: Diagnosis not present

## 2022-11-28 DIAGNOSIS — M545 Low back pain, unspecified: Secondary | ICD-10-CM | POA: Diagnosis not present

## 2022-11-28 DIAGNOSIS — R3915 Urgency of urination: Secondary | ICD-10-CM

## 2022-11-28 DIAGNOSIS — M9905 Segmental and somatic dysfunction of pelvic region: Secondary | ICD-10-CM | POA: Diagnosis not present

## 2022-11-28 DIAGNOSIS — R350 Frequency of micturition: Secondary | ICD-10-CM

## 2022-11-28 DIAGNOSIS — M9903 Segmental and somatic dysfunction of lumbar region: Secondary | ICD-10-CM | POA: Diagnosis not present

## 2022-11-28 NOTE — Telephone Encounter (Signed)
Patient just returned call. I scheduled her a urine lab appointment. She comes in tomorrow.

## 2022-11-28 NOTE — Telephone Encounter (Signed)
Patient needs to provide another urine sample to see if it still looks infected.  Please also find out what specific symptoms she is currently having.

## 2022-11-28 NOTE — Telephone Encounter (Signed)
Pt called stating she saw Dr. Birdie Sons on 11/21/22 for a uti. Pt states she feels as if her symptoms have returned & she may have developed another uti. Pt asked can more of the meds, nitrofurantoin, macrocrystal-monohydrate, (MACROBID) 100 MG capsule be prescribed or does she need to be reevaluated? Call back # [phone]

## 2022-11-28 NOTE — Telephone Encounter (Signed)
We can just recheck another urine and then determine if treatment is needed. Please order a UA, urine micro, and urine culture for dysuria and she can come provide a sample.

## 2022-11-28 NOTE — Telephone Encounter (Signed)
Attempted to call Patient to schedule her for a urine lab appointment.

## 2022-11-28 NOTE — Telephone Encounter (Signed)
Patient states on 11/27/22 she started having urine urgency, burning and pressure. Do you want me to schedule her to come in tomorrow?

## 2022-11-28 NOTE — Addendum Note (Signed)
Addended by: Prince Solian A on: 11/28/2022 04:00 PM   Modules accepted: Orders

## 2022-11-29 ENCOUNTER — Other Ambulatory Visit (INDEPENDENT_AMBULATORY_CARE_PROVIDER_SITE_OTHER): Payer: BC Managed Care – PPO

## 2022-11-29 ENCOUNTER — Other Ambulatory Visit: Payer: BC Managed Care – PPO

## 2022-11-29 ENCOUNTER — Telehealth: Payer: Self-pay | Admitting: *Deleted

## 2022-11-29 DIAGNOSIS — R3915 Urgency of urination: Secondary | ICD-10-CM | POA: Diagnosis not present

## 2022-11-29 DIAGNOSIS — R350 Frequency of micturition: Secondary | ICD-10-CM | POA: Diagnosis not present

## 2022-11-29 LAB — URINALYSIS, MICROSCOPIC ONLY

## 2022-11-29 MED ORDER — CEPHALEXIN 500 MG PO CAPS: 500.0000 mg | ORAL_CAPSULE | Freq: Four times a day (QID) | ORAL | 0 refills | Status: DC

## 2022-11-29 NOTE — Telephone Encounter (Signed)
Apparently saw Dr Birdie Sons 9/16.  Treated for uti.  Apparently brought another urine ins. Need to know what symptoms she is having now.  Also - only have micro showing a few red blood cells.

## 2022-11-29 NOTE — Addendum Note (Signed)
Addended by: Glori Luis on: 11/29/2022 05:01 PM   Modules accepted: Orders

## 2022-11-29 NOTE — Telephone Encounter (Signed)
Message addressed by Dr. Birdie Sons.

## 2022-11-29 NOTE — Telephone Encounter (Signed)
I needed a repeat urine test prior to starting antibiotics. Her urine micro only revealed red blood cells and a few bacteria. It looks like the urine was not run for a urinalysis. Given the urine micro results I was going to wait on the urine culture to confirm if there is bacteria though given her symptoms we can proceed with an antibiotic and stop it if her urine culture is negative. He initial infection was responsive to the antibiotic she was placed on. Though given her recurrent symptoms I will chose a different antibiotic to treat her infection.

## 2022-11-29 NOTE — Telephone Encounter (Signed)
I accidentally sent the message to my pool.  Sending to you and once taken care of, can you remove it from my pool.  Thanks

## 2022-11-29 NOTE — Telephone Encounter (Signed)
Pt called and made aware of the [facility].  Pt once again voiced her frustration that this took so long.  She reported that she always has blood in her urine that has been going on for "25 years."  She reported that she would not be back to our office to see Dr. Birdie Sons.  Thanked pt for this feedback.

## 2022-11-29 NOTE — Telephone Encounter (Signed)
Received your message that Dr Birdie Sons is taking care of this.  Forwarding back to you for documentation.

## 2022-11-29 NOTE — Telephone Encounter (Signed)
Called and spoke with pt. She is complaining of urinary frequency, pressure and burning. Pt is frustrated and has been trying to get help with this problem since yesterday.

## 2022-11-29 NOTE — Telephone Encounter (Signed)
Pt was seen by Dr. Birdie Sons today for possible UTI. PT was not happy with visit and feels like he did not care about her or her symptoms.She proceed to tell me more than stopped an apologized stating that it's not my fault. She saw her results in Epic & wants to be treated.  [facility] uses [facility] [address]  Call back number: [phone]  Pt expecting to hear something by 5pm today.

## 2022-11-30 DIAGNOSIS — M62830 Muscle spasm of back: Secondary | ICD-10-CM | POA: Diagnosis not present

## 2022-11-30 DIAGNOSIS — M545 Low back pain, unspecified: Secondary | ICD-10-CM | POA: Diagnosis not present

## 2022-11-30 DIAGNOSIS — M9903 Segmental and somatic dysfunction of lumbar region: Secondary | ICD-10-CM | POA: Diagnosis not present

## 2022-11-30 DIAGNOSIS — M9905 Segmental and somatic dysfunction of pelvic region: Secondary | ICD-10-CM | POA: Diagnosis not present

## 2022-11-30 LAB — URINE CULTURE
MICRO NUMBER:: 15508768
Result:: NO GROWTH
SPECIMEN QUALITY:: ADEQUATE

## 2022-12-01 ENCOUNTER — Encounter: Payer: Self-pay | Admitting: *Deleted

## 2022-12-01 NOTE — Telephone Encounter (Signed)
Patient states she is returning our call.  I read Latoya's message to patient.  Patient states her symptoms have improved and she would like to know if she should continue taking the medication.

## 2022-12-05 DIAGNOSIS — M62830 Muscle spasm of back: Secondary | ICD-10-CM | POA: Diagnosis not present

## 2022-12-05 DIAGNOSIS — M545 Low back pain, unspecified: Secondary | ICD-10-CM | POA: Diagnosis not present

## 2022-12-05 DIAGNOSIS — M9905 Segmental and somatic dysfunction of pelvic region: Secondary | ICD-10-CM | POA: Diagnosis not present

## 2022-12-05 DIAGNOSIS — M9903 Segmental and somatic dysfunction of lumbar region: Secondary | ICD-10-CM | POA: Diagnosis not present

## 2022-12-07 DIAGNOSIS — M62830 Muscle spasm of back: Secondary | ICD-10-CM | POA: Diagnosis not present

## 2022-12-07 DIAGNOSIS — M545 Low back pain, unspecified: Secondary | ICD-10-CM | POA: Diagnosis not present

## 2022-12-07 DIAGNOSIS — M9905 Segmental and somatic dysfunction of pelvic region: Secondary | ICD-10-CM | POA: Diagnosis not present

## 2022-12-07 DIAGNOSIS — M9903 Segmental and somatic dysfunction of lumbar region: Secondary | ICD-10-CM | POA: Diagnosis not present

## 2022-12-14 DIAGNOSIS — M9903 Segmental and somatic dysfunction of lumbar region: Secondary | ICD-10-CM | POA: Diagnosis not present

## 2022-12-14 DIAGNOSIS — M545 Low back pain, unspecified: Secondary | ICD-10-CM | POA: Diagnosis not present

## 2022-12-14 DIAGNOSIS — M62830 Muscle spasm of back: Secondary | ICD-10-CM | POA: Diagnosis not present

## 2022-12-14 DIAGNOSIS — M9905 Segmental and somatic dysfunction of pelvic region: Secondary | ICD-10-CM | POA: Diagnosis not present

## 2022-12-19 DIAGNOSIS — M545 Low back pain, unspecified: Secondary | ICD-10-CM | POA: Diagnosis not present

## 2022-12-19 DIAGNOSIS — M9903 Segmental and somatic dysfunction of lumbar region: Secondary | ICD-10-CM | POA: Diagnosis not present

## 2022-12-19 DIAGNOSIS — M9905 Segmental and somatic dysfunction of pelvic region: Secondary | ICD-10-CM | POA: Diagnosis not present

## 2022-12-19 DIAGNOSIS — M62830 Muscle spasm of back: Secondary | ICD-10-CM | POA: Diagnosis not present

## 2022-12-21 DIAGNOSIS — M9905 Segmental and somatic dysfunction of pelvic region: Secondary | ICD-10-CM | POA: Diagnosis not present

## 2022-12-21 DIAGNOSIS — M9903 Segmental and somatic dysfunction of lumbar region: Secondary | ICD-10-CM | POA: Diagnosis not present

## 2022-12-21 DIAGNOSIS — M62830 Muscle spasm of back: Secondary | ICD-10-CM | POA: Diagnosis not present

## 2022-12-21 DIAGNOSIS — M545 Low back pain, unspecified: Secondary | ICD-10-CM | POA: Diagnosis not present

## 2022-12-23 ENCOUNTER — Other Ambulatory Visit: Payer: Self-pay | Admitting: Family

## 2022-12-23 DIAGNOSIS — E559 Vitamin D deficiency, unspecified: Secondary | ICD-10-CM

## 2022-12-26 DIAGNOSIS — M9905 Segmental and somatic dysfunction of pelvic region: Secondary | ICD-10-CM | POA: Diagnosis not present

## 2022-12-26 DIAGNOSIS — M9903 Segmental and somatic dysfunction of lumbar region: Secondary | ICD-10-CM | POA: Diagnosis not present

## 2022-12-26 DIAGNOSIS — M62830 Muscle spasm of back: Secondary | ICD-10-CM | POA: Diagnosis not present

## 2022-12-26 DIAGNOSIS — M545 Low back pain, unspecified: Secondary | ICD-10-CM | POA: Diagnosis not present

## 2022-12-28 DIAGNOSIS — M9905 Segmental and somatic dysfunction of pelvic region: Secondary | ICD-10-CM | POA: Diagnosis not present

## 2022-12-28 DIAGNOSIS — M62830 Muscle spasm of back: Secondary | ICD-10-CM | POA: Diagnosis not present

## 2022-12-28 DIAGNOSIS — M545 Low back pain, unspecified: Secondary | ICD-10-CM | POA: Diagnosis not present

## 2022-12-28 DIAGNOSIS — M9903 Segmental and somatic dysfunction of lumbar region: Secondary | ICD-10-CM | POA: Diagnosis not present

## 2022-12-29 ENCOUNTER — Encounter: Payer: BC Managed Care – PPO | Admitting: Family

## 2023-01-02 DIAGNOSIS — M62830 Muscle spasm of back: Secondary | ICD-10-CM | POA: Diagnosis not present

## 2023-01-02 DIAGNOSIS — M9903 Segmental and somatic dysfunction of lumbar region: Secondary | ICD-10-CM | POA: Diagnosis not present

## 2023-01-02 DIAGNOSIS — M9905 Segmental and somatic dysfunction of pelvic region: Secondary | ICD-10-CM | POA: Diagnosis not present

## 2023-01-02 DIAGNOSIS — M545 Low back pain, unspecified: Secondary | ICD-10-CM | POA: Diagnosis not present

## 2023-01-04 DIAGNOSIS — M62830 Muscle spasm of back: Secondary | ICD-10-CM | POA: Diagnosis not present

## 2023-01-04 DIAGNOSIS — M9903 Segmental and somatic dysfunction of lumbar region: Secondary | ICD-10-CM | POA: Diagnosis not present

## 2023-01-04 DIAGNOSIS — M545 Low back pain, unspecified: Secondary | ICD-10-CM | POA: Diagnosis not present

## 2023-01-04 DIAGNOSIS — M9905 Segmental and somatic dysfunction of pelvic region: Secondary | ICD-10-CM | POA: Diagnosis not present

## 2023-01-06 ENCOUNTER — Ambulatory Visit: Payer: BC Managed Care – PPO | Admitting: Physician Assistant

## 2023-01-09 DIAGNOSIS — M9905 Segmental and somatic dysfunction of pelvic region: Secondary | ICD-10-CM | POA: Diagnosis not present

## 2023-01-09 DIAGNOSIS — M62830 Muscle spasm of back: Secondary | ICD-10-CM | POA: Diagnosis not present

## 2023-01-09 DIAGNOSIS — M545 Low back pain, unspecified: Secondary | ICD-10-CM | POA: Diagnosis not present

## 2023-01-09 DIAGNOSIS — M9903 Segmental and somatic dysfunction of lumbar region: Secondary | ICD-10-CM | POA: Diagnosis not present

## 2023-01-11 DIAGNOSIS — M545 Low back pain, unspecified: Secondary | ICD-10-CM | POA: Diagnosis not present

## 2023-01-11 DIAGNOSIS — M9905 Segmental and somatic dysfunction of pelvic region: Secondary | ICD-10-CM | POA: Diagnosis not present

## 2023-01-11 DIAGNOSIS — M62830 Muscle spasm of back: Secondary | ICD-10-CM | POA: Diagnosis not present

## 2023-01-11 DIAGNOSIS — M9903 Segmental and somatic dysfunction of lumbar region: Secondary | ICD-10-CM | POA: Diagnosis not present

## 2023-01-18 DIAGNOSIS — M9903 Segmental and somatic dysfunction of lumbar region: Secondary | ICD-10-CM | POA: Diagnosis not present

## 2023-01-18 DIAGNOSIS — M545 Low back pain, unspecified: Secondary | ICD-10-CM | POA: Diagnosis not present

## 2023-01-18 DIAGNOSIS — M9905 Segmental and somatic dysfunction of pelvic region: Secondary | ICD-10-CM | POA: Diagnosis not present

## 2023-01-18 DIAGNOSIS — M62830 Muscle spasm of back: Secondary | ICD-10-CM | POA: Diagnosis not present

## 2023-01-26 ENCOUNTER — Ambulatory Visit: Payer: BC Managed Care – PPO | Admitting: Physician Assistant

## 2023-01-27 DIAGNOSIS — E559 Vitamin D deficiency, unspecified: Secondary | ICD-10-CM | POA: Diagnosis not present

## 2023-01-27 DIAGNOSIS — Z01419 Encounter for gynecological examination (general) (routine) without abnormal findings: Secondary | ICD-10-CM | POA: Diagnosis not present

## 2023-01-27 DIAGNOSIS — E538 Deficiency of other specified B group vitamins: Secondary | ICD-10-CM | POA: Diagnosis not present
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
## 2023-02-09 ENCOUNTER — Other Ambulatory Visit: Payer: Self-pay

## 2023-02-09 ENCOUNTER — Ambulatory Visit: Payer: BC Managed Care – PPO | Attending: Obstetrics and Gynecology | Admitting: Physical Therapy

## 2023-02-09 DIAGNOSIS — M6281 Muscle weakness (generalized): Secondary | ICD-10-CM | POA: Insufficient documentation

## 2023-02-09 DIAGNOSIS — M62838 Other muscle spasm: Secondary | ICD-10-CM | POA: Diagnosis not present

## 2023-02-09 DIAGNOSIS — R278 Other lack of coordination: Secondary | ICD-10-CM | POA: Diagnosis not present

## 2023-02-13 DIAGNOSIS — M62830 Muscle spasm of back: Secondary | ICD-10-CM | POA: Diagnosis not present

## 2023-02-13 DIAGNOSIS — M9903 Segmental and somatic dysfunction of lumbar region: Secondary | ICD-10-CM | POA: Diagnosis not present

## 2023-02-13 DIAGNOSIS — M545 Low back pain, unspecified: Secondary | ICD-10-CM | POA: Diagnosis not present

## 2023-02-13 DIAGNOSIS — M9905 Segmental and somatic dysfunction of pelvic region: Secondary | ICD-10-CM | POA: Diagnosis not present

## 2023-02-17 ENCOUNTER — Ambulatory Visit: Payer: BC Managed Care – PPO | Admitting: Physician Assistant

## 2023-02-17 ENCOUNTER — Encounter: Payer: Self-pay | Admitting: Physician Assistant

## 2023-02-17 VITALS — BP 131/82 | HR 62 | Ht 66.0 in | Wt 166.0 lb

## 2023-02-17 DIAGNOSIS — R3129 Other microscopic hematuria: Secondary | ICD-10-CM | POA: Diagnosis not present

## 2023-02-17 DIAGNOSIS — N3946 Mixed incontinence: Secondary | ICD-10-CM

## 2023-02-17 DIAGNOSIS — N390 Urinary tract infection, site not specified: Secondary | ICD-10-CM

## 2023-02-17 LAB — URINALYSIS, COMPLETE
Bilirubin, UA: NEGATIVE
Glucose, UA: NEGATIVE
Ketones, UA: NEGATIVE
Nitrite, UA: NEGATIVE
Protein,UA: NEGATIVE
Specific Gravity, UA: 1.025 (ref 1.005–1.030)
Urobilinogen, Ur: 0.2 mg/dL (ref 0.2–1.0)
pH, UA: 6 (ref 5.0–7.5)

## 2023-02-17 LAB — MICROSCOPIC EXAMINATION: Epithelial Cells (non renal): >10 /[HPF] — AB (ref 0–10)

## 2023-02-17 NOTE — Progress Notes (Signed)
02/17/2023 9:47 AM   Tiffany Zamora December 07, 1970 [id number]  CC: Chief Complaint  Patient presents with   Follow-up    1 year follow-up   HPI: Tiffany Zamora is a 52 y.o. female with PMH microscopic hematuria with benign workup in 2023 and OAB wet with mixed urge and stress incontinence who presents today for annual follow-up.   Today she reports no gross hematuria or flank pain over the past year.  She has had 3 UTIs this year, treated by her PCP.  I see 2 positive urine cultures with pansensitive E. coli in her chart.  She has not noticed any association with sex.  She already wipes front to back and voids after sex.  She started pelvic floor physical therapy 1 week ago and is already noticed a "huge improvement" in her urinary leakage.  She also admits to some occasional straining to void.  She is perimenopausal and has a history of IBS-C.  Gynecology recently prescribed topical vaginal estrogen cream, but she has not started this yet.  GI has recommended a fiber supplement, which she has not started yet.  In-office UA today positive for 3+ blood and trace leukocytes; urine microscopy with 6-10 WBCs/HPF, 11-30 RBCs/HPF, >10 epithelial cells/hpf, and many bacteria.  PMH: Past Medical History:  Diagnosis Date   Benign hematuria ~2005   per pt normal workup with cystoscopy [facility])   Heartburn    IBS (irritable bowel syndrome) 09/25/2015   Seasonal allergies    Urine incontinence     Surgical History: Past Surgical History:  Procedure Laterality Date   COLONOSCOPY  2000   for IBS sxs, WNL per patient   COLONOSCOPY WITH PROPOFOL N/A 05/18/2020   Procedure: COLONOSCOPY WITH PROPOFOL;  Surgeon: Toney Reil, MD;  Location: [facility] ENDOSCOPY;  Service: Gastroenterology;  Laterality: N/A;   DILATION AND CURETTAGE OF UTERUS  08/2007   WISDOM TOOTH EXTRACTION  1993    Home Medications:  Allergies as of 02/17/2023   No Known Allergies      Medication List         Accurate as of February 17, 2023  9:47 AM. If you have any questions, ask your nurse or doctor.          STOP taking these medications    cephALEXin 500 MG capsule Commonly known as: KEFLEX Stopped by: Carman Ching   cetirizine 10 MG tablet Commonly known as: ZYRTEC Stopped by: Carman Ching   Cholecalciferol 1.25 MG (50000 UT) Tabs Stopped by: Carman Ching   fluticasone 50 MCG/ACT nasal spray Commonly known as: FLONASE Stopped by: Carman Ching   nitrofurantoin (macrocrystal-monohydrate) 100 MG capsule Commonly known as: Macrobid Stopped by: Carman Ching       TAKE these medications    B-12 1000 MCG Tabs Take 1-2 tablets by mouth daily.   melatonin 3 MG Tabs tablet Take 3 mg by mouth at bedtime.        Allergies:  No Known Allergies  Family History: Family History  Problem Relation Age of Onset   Diabetes Sister    Heart attack Maternal Grandfather 45       MI   Hypertension Mother    Tongue cancer Mother        prior smoker   Hypertension Father    Dementia Maternal Grandmother 95       died at 57   Cancer Neg Hx    Stroke Neg Hx     Social History:   reports  that she has never smoked. She has never been exposed to tobacco smoke. She has never used smokeless tobacco. She reports current alcohol use. She reports that she does not use drugs.  Physical Exam: BP 131/82   Pulse 62   Ht 5\' 6"  (1.676 m)   Wt 166 lb (75.3 kg)   LMP 02/02/2022 (Exact Date)   BMI 26.79 kg/m   Constitutional:  Alert and oriented, no acute distress, nontoxic appearing HEENT: [place], AT Cardiovascular: No clubbing, cyanosis, or edema Respiratory: Normal respiratory effort, no increased work of breathing Skin: No rashes, bruises or suspicious lesions Neurologic: Grossly intact, no focal deficits, moving all 4 extremities Psychiatric: Normal mood and affect  Laboratory Data: Results for orders placed or performed in  visit on 02/17/23  Microscopic Examination   Collection Time: 02/17/23  9:36 AM   Urine  Result Value Ref Range   WBC, UA 6-10 (A) 0 - 5 /hpf   RBC, Urine 11-30 (A) 0 - 2 /hpf   Epithelial Cells (non renal) >10 (A) 0 - 10 /hpf   Mucus, UA Present (A) Not Estab.   Bacteria, UA Many (A) None seen/Few  Urinalysis, Complete   Collection Time: 02/17/23  9:36 AM  Result Value Ref Range   Specific Gravity, UA 1.025 1.005 - 1.030   pH, UA 6.0 5.0 - 7.5   Color, UA Yellow Yellow   Appearance Ur Clear Clear   Leukocytes,UA Trace (A) Negative   Protein,UA Negative Negative/Trace   Glucose, UA Negative Negative   Ketones, UA Negative Negative   RBC, UA 3+ (A) Negative   Bilirubin, UA Negative Negative   Urobilinogen, Ur 0.2 0.2 - 1.0 mg/dL   Nitrite, UA Negative Negative   Microscopic Examination See below:    Assessment & Plan:   1. Microscopic hematuria (Primary) Persistent microscopic hematuria on UA today, though her UA does appear contaminated.  I offered her CT urogram, but she prefers to defer this given the chronicity of her hematuria and no warning signs including flank pain or gross hematuria.  I think this is reasonable.  Will continue to monitor with annual UAs and we discussed the need for repeat hematuria workups approximately every 3 to 5 years or sooner if she develops warning signs.  She expressed understanding. - Urinalysis, Complete  2. Mixed incontinence Already significantly improved after 1 week of pelvic floor PT, great result and I encouraged her to continue this.  3. Recurrent UTI She newly meets clinical criteria for recurrent UTIs.  We discussed prevention strategies including staying hydrated, fiber supplements for constipation, cranberry and d-mannose supplements, Lactobacillus-containing probiotics. Agree with estrogen cream. May consider prophylactic antibiotics in the future if frequent.  Return in about 1 year (around 02/17/2024) for Annual f/u with  UA.  Carman Ching, PA-C  [facility] [place] [address] [place], [place] [address] [phone]

## 2023-02-17 NOTE — Patient Instructions (Signed)
Recurrent UTI Prevention Strategies  Stay well hydrated. Eat a diet rich in fruit and vegetables. Start a fiber supplement to manage your constipation. Start taking an over-the-counter cranberry supplement, with or without d-mannose, for urinary tract health. Take this once or twice daily on an empty stomach, e.g. right before bed. Start taking an over-the-counter probiotic containing the bacterial species called Lactobacillus. Take this daily. Start vaginal estrogen cream per your GYN's recommendations.

## 2023-02-24 ENCOUNTER — Encounter: Payer: Self-pay | Admitting: Physical Therapy

## 2023-02-24 ENCOUNTER — Ambulatory Visit: Payer: BC Managed Care – PPO | Admitting: Physical Therapy

## 2023-02-24 DIAGNOSIS — R278 Other lack of coordination: Secondary | ICD-10-CM

## 2023-02-24 DIAGNOSIS — M6281 Muscle weakness (generalized): Secondary | ICD-10-CM | POA: Diagnosis not present

## 2023-02-24 DIAGNOSIS — M62838 Other muscle spasm: Secondary | ICD-10-CM | POA: Diagnosis not present
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
## 2023-02-27 DIAGNOSIS — M545 Low back pain, unspecified: Secondary | ICD-10-CM | POA: Diagnosis not present

## 2023-02-27 DIAGNOSIS — M9903 Segmental and somatic dysfunction of lumbar region: Secondary | ICD-10-CM | POA: Diagnosis not present

## 2023-02-27 DIAGNOSIS — M62830 Muscle spasm of back: Secondary | ICD-10-CM | POA: Diagnosis not present

## 2023-02-27 DIAGNOSIS — M9905 Segmental and somatic dysfunction of pelvic region: Secondary | ICD-10-CM | POA: Diagnosis not present

## 2023-03-02 DIAGNOSIS — L600 Ingrowing nail: Secondary | ICD-10-CM | POA: Diagnosis not present

## 2023-03-02 DIAGNOSIS — B351 Tinea unguium: Secondary | ICD-10-CM | POA: Diagnosis not present

## 2023-03-22 DIAGNOSIS — M9905 Segmental and somatic dysfunction of pelvic region: Secondary | ICD-10-CM | POA: Diagnosis not present

## 2023-03-22 DIAGNOSIS — M9903 Segmental and somatic dysfunction of lumbar region: Secondary | ICD-10-CM | POA: Diagnosis not present

## 2023-03-22 DIAGNOSIS — M62830 Muscle spasm of back: Secondary | ICD-10-CM | POA: Diagnosis not present

## 2023-03-22 DIAGNOSIS — M545 Low back pain, unspecified: Secondary | ICD-10-CM | POA: Diagnosis not present

## 2023-03-29 ENCOUNTER — Encounter: Payer: Self-pay | Admitting: Family

## 2023-03-29 DIAGNOSIS — E781 Pure hyperglyceridemia: Secondary | ICD-10-CM

## 2023-04-06 ENCOUNTER — Ambulatory Visit
Admission: RE | Admit: 2023-04-06 | Discharge: 2023-04-06 | Disposition: A | Discharge status: Home / Self Care | Payer: Self-pay | Source: Ambulatory Visit | Attending: Family | Admitting: Family

## 2023-04-06 DIAGNOSIS — E781 Pure hyperglyceridemia: Secondary | ICD-10-CM | POA: Insufficient documentation

## 2023-04-06 DIAGNOSIS — D2262 Melanocytic nevi of left upper limb, including shoulder: Secondary | ICD-10-CM | POA: Diagnosis not present

## 2023-04-06 DIAGNOSIS — D2261 Melanocytic nevi of right upper limb, including shoulder: Secondary | ICD-10-CM | POA: Diagnosis not present

## 2023-04-06 DIAGNOSIS — D225 Melanocytic nevi of trunk: Secondary | ICD-10-CM | POA: Diagnosis not present

## 2023-04-06 DIAGNOSIS — D2272 Melanocytic nevi of left lower limb, including hip: Secondary | ICD-10-CM | POA: Diagnosis not present

## 2023-04-07 ENCOUNTER — Ambulatory Visit: Payer: BC Managed Care – PPO | Attending: Cardiology | Admitting: Cardiology

## 2023-04-07 DIAGNOSIS — R000 Tachycardia, unspecified: Secondary | ICD-10-CM

## 2023-04-07 DIAGNOSIS — R0602 Shortness of breath: Secondary | ICD-10-CM

## 2023-04-07 DIAGNOSIS — K644 Residual hemorrhoidal skin tags: Secondary | ICD-10-CM | POA: Diagnosis not present

## 2023-04-07 MED ORDER — OMEPRAZOLE 20 MG PO CPDR: 20.0000 mg | DELAYED_RELEASE_CAPSULE | Freq: Every day | ORAL | 3 refills | Status: DC

## 2023-04-07 NOTE — Progress Notes (Unsigned)
Cardiology Office Note:  .   Date:  04/10/2023  ID:  Tiffany Zamora, DOB November 17, 1970, MRN [id number] PCP: Mort Sawyers, FNP  [facility] Providers Cardiologist:  Bryan Lemma, MD     Chief Complaint  Patient presents with   Follow-up    Palpitations and shortness of breath    Patient Profile: .     Tiffany Zamora is a 53 y.o. female non-smoker with a PMH notable for IBS, heartburn and seasonal allergies who presents here for evaluation of INTERMITTENT DYSPNEA at the request of Mort Sawyers, FNP.      Tiffany Zamora was last seen in April 2024 for evaluation of shortness of breath and racing heart rate.  We talked about using her Apple Watch to monitor for palpitations as is not that been that frequently.  If prolonged episodes occurred more frequently, could consider monitor.  Also for her dyspnea, if still an issue in 4 to 6 months, would consider CPX as well as potentially recheck echo  Subjective  Discussed the use of AI scribe software for clinical note transcription with the patient, who gave verbal consent to proceed.  History of Present Illness   The patient presents with palpitations and shortness of breath.  She experiences palpitations, particularly at night when she gets up to use the restroom or when she rolls over in bed. Her heart feels 'racing' but not irregular. The palpitations are less severe than a few months ago. Her Apple watch shows an increased heart rate during these episodes without indicating irregularities.  She has intermittent shortness of breath, more often when sitting and resting rather than during exertion. She describes difficulty breathing in, but not out. No chest tightness, pressure, dizziness, or waking up short of breath at night. Sometimes, the shortness of breath is accompanied by a sensation of not being able to breathe in fully.  A CT scan from February 03, 2022, indicated mild cardiomegaly, but previous  echocardiograms have been normal. She has been evaluated by pulmonary medicine with no significant findings reported.  She is currently in menopause and wonders if her symptoms could be hormone-related. She mentions a friend's experience with shortness of breath related to acid reflux and considers if this could be a factor in her symptoms.      Cardiovascular ROS: positive for - dyspnea on exertion, irregular heartbeat, palpitations, rapid heart rate, and shortness of breath negative for - chest pain, edema, orthopnea, palpitations, shortness of breath, or lightheadedness, dizziness or wooziness, syncope/near-syncope or TIA/amaurosis fugax, claudication  ROS:  Review of Systems - Negative except intermittent heartburn symptoms.  Hot flushes.    Objective    Studies Reviewed: Marland Kitchen   EKG Interpretation Date/Time:  Friday April 07 2023 09:41:45 EST Ventricular Rate:  72 PR Interval:  168 QRS Duration:  94 QT Interval:  392 QTC Calculation: 429 R Axis:   21  Text Interpretation: Normal sinus rhythm Possible Septal infarct (cited on or before 03-Feb-2022) When compared with ECG of 03-Feb-2022 14:12, No significant change since last tracing Confirmed by Bryan Lemma ([id number]) on 04/07/2023 10:08:06 AM    Coronary Calcium Score CT (04/06/2023): Total CAC score 0  TTE (11/15/2017): Normal LV size and function.  EF 55 to 60%.  No RWMA.  Normal diastolic parameters.  Mild LA dilation.  Risk Assessment/Calculations:        Physical Exam:   VS:  BP 126/80 (BP Location: Right Arm, Patient Position: Sitting, Cuff Size: Normal)   Pulse 72  Ht 5\' 6"  (1.676 m)   Wt 175 lb (79.4 kg)   LMP 02/02/2022 (Exact Date)   SpO2 99%   BMI 28.25 kg/m    Wt Readings from Last 3 Encounters:  04/07/23 175 lb (79.4 kg)  02/17/23 166 lb (75.3 kg)  11/21/22 172 lb 3.2 oz (78.1 kg)    GEN: Well nourished, well groomed in no acute distress; healthy-appearing NECK: No JVD; No carotid bruits CARDIAC: Normal  S1, S2; RRR, no murmurs, rubs, gallops RESPIRATORY:  Clear to auscultation without rales, wheezing or rhonchi ; nonlabored, good air movement. ABDOMEN: Soft, non-tender, non-distended EXTREMITIES:  No edema; No deformity      ASSESSMENT AND PLAN: .    Problem List Items Addressed This Visit     Racing heart beat (Chronic)   Patient reports intermittent episodes of tachycardia, particularly at night and during rest. No irregular rhythm noted. Episodes have improved over the past month. Apple watch monitoring shows increased heart rate during episodes but no arrhythmias. Discussed possible physiological causes including positional changes and hydration status..  Discussed adequate hydration and avoiding triggers.      Relevant Orders   EKG 12-Lead (Completed)   Shortness of breath   Chronic dyspnea.  PCP feels it may be related to stress.  Coronary Calcium Score just done and showed low risk for CAD.  Previous echo noncontributory..  Talked about potentially doing a CPX to assess the symptoms since it is also exertional in nature. She has had some GERD sensations, -Start trial of Omeprazole 20mg  daily for 1-2 months to assess if GERD may be contributing to symptoms. -If symptoms persist, consider cardiopulmonary exercise test to further evaluate cardiac and pulmonary function.      Relevant Orders   Cardiac Stress Test: Informed Consent Details: Physician/Practitioner Attestation; Transcribe to consent form and obtain patient signature          Informed Consent   If the patient has persistent symptoms, we will proceed with CPX testing Shared Decision Making/Informed Consent The risks [chest pain, shortness of breath, cardiac arrhythmias, dizziness, blood pressure fluctuations, myocardial infarction, stroke/transient ischemic attack, and life-threatening complications (estimated to be 1 in 10,000)], benefits (risk stratification, diagnosing coronary artery disease, treatment  guidance) and alternatives of an exercise tolerance test were discussed in detail with Tiffany Zamora and she agrees to proceed.      Follow-Up: Return in about 8 months (around 12/05/2023) for Routine follow up with me.      Signed, Marykay Lex, MD, MS Bryan Lemma, M.D., M.S. Interventional Cardiologist  [facility]  Pager # [phone] Phone # [phone] [address] [place], [place] [address]

## 2023-04-07 NOTE — Patient Instructions (Addendum)
Medication Instructions:   Omeprazole 20 mg daily  --   contact the office in 1 to 2 month to let us know if this hep your symptoms if not Dr Herbie Baltimore will order a test called Cardiopulmonary. Function test.  tions before your next appointment, please call your pharmacy*   Lab Work:  If you have labs (blood work) drawn today and your tests are completely normal, you will receive your results only by: MyChart Message (if you have MyChart) OR A paper copy in the mail If you have any lab test that is abnormal or we need to change your treatment, we will call you to review the results.   Testing/Procedures: Not needed   Follow-Up: At [facility], you and your health needs are our priority.  As part of our continuing mission to provide you with exceptional heart care, we have created designated Provider Care Teams.  These Care Teams include your primary Cardiologist (physician) and Advanced Practice Providers (APPs -  Physician Assistants and Nurse Practitioners) who all work together to provide you with the care you need, when you need it.     Your next appointment:   8 month(s)  The format for your next appointment:   In Person  Provider:   Bryan Lemma, MD

## 2023-04-09 ENCOUNTER — Encounter: Payer: Self-pay | Admitting: Family

## 2023-04-10 ENCOUNTER — Encounter: Payer: Self-pay | Admitting: Cardiology

## 2023-04-10 NOTE — Assessment & Plan Note (Addendum)
Chronic dyspnea.  PCP feels it may be related to stress.  Coronary Calcium Score just done and showed low risk for CAD.  Previous echo noncontributory..  Talked about potentially doing a CPX to assess the symptoms since it is also exertional in nature. She has had some GERD sensations, -Start trial of Omeprazole 20mg  daily for 1-2 months to assess if GERD may be contributing to symptoms. -If symptoms persist, consider cardiopulmonary exercise test to further evaluate cardiac and pulmonary function.

## 2023-04-10 NOTE — Assessment & Plan Note (Signed)
Patient reports intermittent episodes of tachycardia, particularly at night and during rest. No irregular rhythm noted. Episodes have improved over the past month. Apple watch monitoring shows increased heart rate during episodes but no arrhythmias. Discussed possible physiological causes including positional changes and hydration status..  Discussed adequate hydration and avoiding triggers.

## 2023-04-12 DIAGNOSIS — M62830 Muscle spasm of back: Secondary | ICD-10-CM | POA: Diagnosis not present

## 2023-04-12 DIAGNOSIS — M545 Low back pain, unspecified: Secondary | ICD-10-CM | POA: Diagnosis not present

## 2023-04-12 DIAGNOSIS — M9905 Segmental and somatic dysfunction of pelvic region: Secondary | ICD-10-CM | POA: Diagnosis not present

## 2023-04-12 DIAGNOSIS — M9903 Segmental and somatic dysfunction of lumbar region: Secondary | ICD-10-CM | POA: Diagnosis not present

## 2023-04-27 ENCOUNTER — Ambulatory Visit: Payer: BC Managed Care – PPO | Attending: Obstetrics and Gynecology | Admitting: Physical Therapy

## 2023-05-01 DIAGNOSIS — M9905 Segmental and somatic dysfunction of pelvic region: Secondary | ICD-10-CM | POA: Diagnosis not present

## 2023-05-01 DIAGNOSIS — M9903 Segmental and somatic dysfunction of lumbar region: Secondary | ICD-10-CM | POA: Diagnosis not present

## 2023-05-01 DIAGNOSIS — M545 Low back pain, unspecified: Secondary | ICD-10-CM | POA: Diagnosis not present

## 2023-05-01 DIAGNOSIS — M62830 Muscle spasm of back: Secondary | ICD-10-CM | POA: Diagnosis not present

## 2023-05-25 ENCOUNTER — Other Ambulatory Visit: Payer: Self-pay

## 2023-05-25 ENCOUNTER — Ambulatory Visit: Payer: BC Managed Care – PPO | Admitting: Internal Medicine

## 2023-05-25 ENCOUNTER — Encounter: Payer: Self-pay | Admitting: Internal Medicine

## 2023-05-25 VITALS — BP 118/82 | HR 80 | Temp 98.0°F | Resp 16 | Ht 66.0 in | Wt 177.3 lb

## 2023-05-25 DIAGNOSIS — N393 Stress incontinence (female) (male): Secondary | ICD-10-CM | POA: Diagnosis not present

## 2023-05-25 DIAGNOSIS — K582 Mixed irritable bowel syndrome: Secondary | ICD-10-CM | POA: Diagnosis not present

## 2023-05-25 DIAGNOSIS — K219 Gastro-esophageal reflux disease without esophagitis: Secondary | ICD-10-CM | POA: Insufficient documentation

## 2023-05-25 DIAGNOSIS — N951 Menopausal and female climacteric states: Secondary | ICD-10-CM | POA: Insufficient documentation

## 2023-05-25 DIAGNOSIS — E782 Mixed hyperlipidemia: Secondary | ICD-10-CM | POA: Insufficient documentation

## 2023-05-25 NOTE — Assessment & Plan Note (Signed)
 Reviewed labs from last year. Discussed black cohosh, effexor and HRT. She does have a gynecologist who prescribes her vaginal estrogen.

## 2023-05-25 NOTE — Assessment & Plan Note (Signed)
 Place a new referral to pelvic floor PT here locally.

## 2023-05-25 NOTE — Progress Notes (Signed)
 New Patient Office Visit  Subjective    Patient ID: Tiffany Zamora, female    DOB: 1970-07-13  Age: 53 y.o. MRN: [id number]  CC:  Chief Complaint  Patient presents with   Establish Care    HPI Curtistine Pettitt presents to establish care.  IBS: -Alternates from diarrhea to constipation, currently dealing with more constipation symptoms -Taking a fiber supplements, uses Miralax occasionally.   Silent Reflux: -Presented with episodes of SOB, cardiac and pulmonary work up's negative but symptoms controlled after PPI therapy, now taking Prilosec 20 mg PRN  Peri-Menopausal Symptoms/Stress Incontinence: -LMP 11/24 but inconsistent for the last year -Does pelvic floor PT which works really well for her, looking to go somewhere closer (lives in [place]) -Does use vaginal estrogen cream  Health Maintenance: -Blood work UTD -Mammogram 8/24 -Colonoscopy 3/22, repeat in 10 years  Outpatient Encounter Medications as of 05/25/2023  Medication Sig   estradiol (ESTRACE) 0.1 MG/GM vaginal cream Insert 0.5 applicatorsful twice a week by vaginal route for 84 days.   melatonin 3 MG TABS tablet Take 3 mg by mouth at bedtime.   omeprazole (PRILOSEC) 20 MG capsule Take 1 capsule (20 mg total) by mouth daily.   No facility-administered encounter medications on file as of 05/25/2023.    Past Medical History:  Diagnosis Date   Benign hematuria ~2005   per pt normal workup with cystoscopy [facility])   Heartburn    IBS (irritable bowel syndrome) 09/25/2015   Seasonal allergies    Urine incontinence     Past Surgical History:  Procedure Laterality Date   COLONOSCOPY  2000   for IBS sxs, WNL per patient   COLONOSCOPY WITH PROPOFOL N/A 05/18/2020   Procedure: COLONOSCOPY WITH PROPOFOL;  Surgeon: Toney Reil, MD;  Location: [facility] ENDOSCOPY;  Service: Gastroenterology;  Laterality: N/A;   DILATION AND CURETTAGE OF UTERUS  08/2007   WISDOM TOOTH EXTRACTION  1993    Family History   Problem Relation Age of Onset   Diabetes Sister    Heart attack Maternal Grandfather 58       MI   Hypertension Mother    Tongue cancer Mother        prior smoker   Hypertension Father    Dementia Maternal Grandmother 95       died at 101   Cancer Neg Hx    Stroke Neg Hx     Social History   Socioeconomic History   Marital status: Married    Spouse name: Alycia Rossetti   Number of children: 1   Years of education: Masters   Highest education level: Not on file  Occupational History   Occupation: stay at home  Tobacco Use   Smoking status: Never    Passive exposure: Never   Smokeless tobacco: Never  Vaping Use   Vaping status: Never Used  Substance and Sexual Activity   Alcohol use: Yes    Alcohol/week: 0.0 standard drinks of alcohol    Comment: a few times a week, 2 glasses   Drug use: No   Sexual activity: Yes    Partners: Male    Birth control/protection: Pill  Other Topics Concern   Not on file  Social History Narrative   Wife of Fayrene Fearing "Alycia Rossetti" [place]eller and daughter and dog   Occupation: stay at home mom   Edu: master's in criminology      05/23/19   From: [place], moved because her husband is from here   Living: with Husband, Alycia Rossetti  and daughter and dog   Work: former Emergency planning/management officer, 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      Family: daughter Danne Harbor (2007), in-laws nearby with a good relationship      Enjoys: hiking, going to the gym, biking, spend time with friends, photography things      Activity: exercises regularly 3-5x/wk   Diet: not so good, trying to change that      Safety   Seat belts: Yes    Guns: Yes  and secure   Safe in relationships: Yes    Social Drivers of Corporate investment banker Strain: Not on file  Food Insecurity: Not on file  Transportation Needs: Not on file  Physical Activity: Not on file  Stress: Not on file  Social Connections: Not on file  Intimate Partner Violence: Not on file    Review of Systems  Constitutional:  Negative for chills  and fever.  Respiratory:  Negative for shortness of breath.   Gastrointestinal:  Positive for constipation. Negative for heartburn, nausea and vomiting.  Genitourinary:  Positive for urgency. Negative for dysuria, frequency and hematuria.        Objective    BP 118/82 (Cuff Size: Large)   Pulse 80   Temp 98 F (36.7 C) (Oral)   Resp 16   Ht 5\' 6"  (1.676 m)   Wt 177 lb 4.8 oz (80.4 kg)   LMP 02/02/2022 (Exact Date)   SpO2 94%   BMI 28.62 kg/m   Physical Exam Constitutional:      Appearance: Normal appearance.  HENT:     Head: Normocephalic and atraumatic.  Eyes:     Conjunctiva/sclera: Conjunctivae normal.  Cardiovascular:     Rate and Rhythm: Normal rate and regular rhythm.  Pulmonary:     Effort: Pulmonary effort is normal.     Breath sounds: Normal breath sounds.  Musculoskeletal:     Right lower leg: No edema.     Left lower leg: No edema.  Skin:    General: Skin is warm and dry.  Neurological:     General: No focal deficit present.     Mental Status: She is alert. Mental status is at baseline.  Psychiatric:        Mood and Affect: Mood normal.        Behavior: Behavior normal.         Assessment & Plan:  Stress incontinence of urine Assessment & Plan: Place a new referral to pelvic floor PT here locally.  Orders: -     Ambulatory referral to Physical Therapy  Perimenopause Assessment & Plan: Reviewed labs from last year. Discussed black cohosh, effexor and HRT. She does have a gynecologist who prescribes her vaginal estrogen.    Gastroesophageal reflux disease, unspecified whether esophagitis present Assessment & Plan: Silent reflux, presented as SOB. Continue PPI PRN.   Irritable bowel syndrome with both constipation and diarrhea Assessment & Plan: Discussed staying well hydrated, continue fiber supplements and can take Miralax daily with the goal of one soft BM daily. Can also use prune juice and magnesium citrate.    Moderate mixed  hyperlipidemia not requiring statin therapy Assessment & Plan: Reviewed patient's labs from August, plan to recheck fasting labs at follow up.      Return in about 6 months (around 11/25/2023).   Margarita Mail, DO

## 2023-05-25 NOTE — Assessment & Plan Note (Signed)
 Silent reflux, presented as SOB. Continue PPI PRN.

## 2023-05-25 NOTE — Assessment & Plan Note (Signed)
 Reviewed patient's labs from August, plan to recheck fasting labs at follow up.

## 2023-05-25 NOTE — Assessment & Plan Note (Signed)
 Discussed staying well hydrated, continue fiber supplements and can take Miralax daily with the goal of one soft BM daily. Can also use prune juice and magnesium citrate.

## 2023-06-26 DIAGNOSIS — M545 Low back pain, unspecified: Secondary | ICD-10-CM | POA: Diagnosis not present

## 2023-06-26 DIAGNOSIS — M9905 Segmental and somatic dysfunction of pelvic region: Secondary | ICD-10-CM | POA: Diagnosis not present

## 2023-06-26 DIAGNOSIS — M9903 Segmental and somatic dysfunction of lumbar region: Secondary | ICD-10-CM | POA: Diagnosis not present

## 2023-06-26 DIAGNOSIS — M62830 Muscle spasm of back: Secondary | ICD-10-CM | POA: Diagnosis not present

## 2023-07-03 ENCOUNTER — Other Ambulatory Visit: Payer: Self-pay | Admitting: Cardiology

## 2023-07-19 ENCOUNTER — Ambulatory Visit: Admitting: Family Medicine

## 2023-07-19 ENCOUNTER — Other Ambulatory Visit (HOSPITAL_COMMUNITY): Payer: Self-pay

## 2023-07-19 ENCOUNTER — Telehealth: Payer: Self-pay | Admitting: Pharmacy Technician

## 2023-07-19 ENCOUNTER — Ambulatory Visit: Payer: Self-pay

## 2023-07-19 ENCOUNTER — Encounter: Payer: Self-pay | Admitting: Family Medicine

## 2023-07-19 VITALS — BP 136/84 | HR 73 | Resp 16 | Ht 66.0 in | Wt 178.3 lb

## 2023-07-19 DIAGNOSIS — G8929 Other chronic pain: Secondary | ICD-10-CM | POA: Diagnosis not present

## 2023-07-19 DIAGNOSIS — M545 Low back pain, unspecified: Secondary | ICD-10-CM

## 2023-07-19 DIAGNOSIS — N951 Menopausal and female climacteric states: Secondary | ICD-10-CM | POA: Diagnosis not present

## 2023-07-19 DIAGNOSIS — M533 Sacrococcygeal disorders, not elsewhere classified: Secondary | ICD-10-CM | POA: Diagnosis not present

## 2023-07-19 MED ORDER — CELECOXIB 100 MG PO CAPS
100.0000 mg | ORAL_CAPSULE | Freq: Two times a day (BID) | ORAL | 0 refills | Status: DC
Start: 2023-07-19 — End: 2023-11-27

## 2023-07-19 NOTE — Telephone Encounter (Signed)
 Pharmacy Patient Advocate Encounter   Received notification from CoverMyMeds that prior authorization for Celecoxib 100MG  capsules is required/requested.   Insurance verification completed.   The patient is insured through [name] .   Per test claim: PA required; PA submitted to above mentioned insurance via CoverMyMeds Key/confirmation #/EOC [facility] Status is pending

## 2023-07-19 NOTE — Telephone Encounter (Signed)
 Copied from CRM [id number]. Topic: Clinical - Red Word Triage >> Jul 19, 2023 11:57 AM Tiffany Zamora wrote: Red Word that prompted transfer to Nurse Triage: Patient has been experiencing pain in her tailbone. Last night was the worst.  Chief Complaint: tailbone pain Symptoms: pain Frequency: for 4 months Pertinent Negatives: Patient denies fever, numbness, weakness, injury Disposition: [] ED /[] Urgent Care (no appt availability in office) / [x] Appointment(In office/virtual)/ []  [facility]/ [] Home Care/ [] Refused Recommended Disposition /[] [facility]/ []  Follow-up with PCP Additional Notes: per protocol apt made for today; care advice given, denies questions; instructed to go to ER if becomes worse.   Reason for Disposition  [1] MODERATE pain (e.g., interferes with normal activities) AND [2] high-risk adult (e.g., age > 60 years, osteoporosis, chronic steroid use)  Answer Assessment - Initial Assessment Questions 1. MECHANISM: "How did the injury happen?"       Denies injury 2. ONSET: "When did the injury happen?" (Minutes or hours ago)      Pain started 4 months ago 3. LOCATION: "Where is the injury located?"      Lower back; tailbone 4. SEVERITY: "Can you sit?" "Can you walk?"      Sitting down hurts 5. PAIN: "Is there pain?" If Yes, ask: "How bad is the pain?" (e.g., Scale 1-10; mild, moderate, or severe)   - MILD (1-3): doesn't interfere with normal activities    - MODERATE (4-7): interferes with normal activities or awakens from sleep    - SEVERE (8-10): excruciating pain, unable to do any normal activities      moderate 6. SIZE: For bruises, or swelling, ask: "How large is it?" (e.g., inches or centimeters)      no 7. OTHER SYMPTOMS: "Do you have any other symptoms?" (e.g., numbness, back pain)     Back pain,  8. PREGNANCY: "Is there any chance you are pregnant?" "When was your last menstrual period?"     na  Protocols used: Tailbone Injury-A-AH

## 2023-07-19 NOTE — Progress Notes (Signed)
 Name: Tiffany Zamora   MRN: [id number]    DOB: 29-Aug-1970   Date:07/19/2023       Progress Note  Subjective  Chief Complaint  Chief Complaint  Patient presents with   Tailbone Pain    3-4 months mainly triggers while sitting    Discussed the use of AI scribe software for clinical note transcription with the patient, who gave verbal consent to proceed.  History of Present Illness Tiffany Zamora is a 53 year old female who presents with tailbone pain and chronic low back pain.  She has been experiencing tailbone pain for the past three to four months, primarily when sitting, with no history of trauma or injury. Recently, she noted a new type of pain while lying in bed, described as 'weird' and different from her usual symptoms. Today, she describes an achy sensation in the tailbone area, which is unusual for her. She has not been taking any medication for the pain and typically manages by staying active to relieve pressure.  She also has chronic low back pain that has been present for over a year, with intermittent episodes over the past five to eight years. The pain worsens with standing or walking. She has previously sought chiropractic care, which provided temporary relief. Regular stretching helps alleviate the pain but does not eliminate it completely.  She experiences urinary urgency, a chronic issue she has had for a long time, but denies any bowel or bladder incontinence. No shooting pain down her legs. She has not been taking any pain medication regularly.  She is currently experiencing perimenopausal symptoms, including hot flashes, which have been less frequent recently. She has not started any specific treatment for these symptoms.    Patient Active Problem List   Diagnosis Date Noted   Perimenopause 05/25/2023   Gastroesophageal reflux disease 05/25/2023   Moderate mixed hyperlipidemia not requiring statin therapy 05/25/2023   Frequency of urination 11/21/2022    Bilateral tinnitus 10/31/2022   Sleep disorder 10/26/2022   Irritable bowel syndrome with both constipation and diarrhea 07/04/2022   Vitamin B12 deficiency 07/04/2022   Racing heart beat 05/30/2022   Mixed stress and urge urinary incontinence 03/31/2022   Constipation 04/21/2020   Urinary incontinence 05/23/2019   Shortness of breath 09/20/2017   Hypertriglyceridemia 09/25/2015   Benign hematuria     Social History   Tobacco Use   Smoking status: Never    Passive exposure: Never   Smokeless tobacco: Never  Substance Use Topics   Alcohol use: Yes    Alcohol/week: 0.0 standard drinks of alcohol    Comment: a few times a week, 2 glasses     Current Outpatient Medications:    estradiol (ESTRACE) 0.1 MG/GM vaginal cream, Insert 0.5 applicatorsful twice a week by vaginal route for 84 days., Disp: , Rfl:    melatonin 3 MG TABS tablet, Take 3 mg by mouth at bedtime., Disp: , Rfl:    omeprazole  (PRILOSEC) 20 MG capsule, TAKE 1 CAPSULE BY MOUTH EVERY DAY, Disp: 90 capsule, Rfl: 1  No Known Allergies  ROS  Ten systems reviewed and is negative except as mentioned in HPI    Objective  Vitals:   07/19/23 1301  BP: 136/84  Pulse: 73  Resp: 16  SpO2: 98%  Weight: 178 lb 4.8 oz (80.9 kg)  Height: 5\' 6"  (1.676 m)    Body mass index is 28.78 kg/m.  Physical Exam  Constitutional: Patient appears well-developed and well-nourished.  No distress.  HEENT: head  atraumatic, normocephalic, pupils equal and reactive to light, neck supple Cardiovascular: Normal rate, regular rhythm and normal heart sounds.  No murmur heard. No BLE edema. Pulmonary/Chest: Effort normal and breath sounds normal. No respiratory distress. Abdominal: Soft.  There is no tenderness. Muscular skeletal: normal rom of spine, negative straight leg raise  Psychiatric: Patient has a normal mood and affect. behavior is normal. Judgment and thought content normal.    Assessment & Plan Coccyx pain  (Coccygodynia) Chronic coccyx pain for 3-4 months, likely musculoskeletal without nerve involvement. Discussed treatment challenges and avoiding pressure on the tailbone. - Order x-ray of the coccyx to rule out lytic lesions. - Advise use of donut cushions to relieve pressure on the tailbone. - Recommend acetaminophen, up to 3 grams per day. - Prescribe celecoxib, 60 tablets, twice daily. - Refer to Dr. Lydia Sams, sports medicine orthopedic specialist.  Chronic low back pain Chronic low back pain for over a year, likely musculoskeletal. Discussed benefits of physical therapy and chiropractic care. - Order x-ray of the lumbar spine. - Refer to Dr. Lydia Sams for further evaluation and management. - Recommend stretching exercises.  Menopausal symptoms Intermittent menopausal symptoms including hot flashes and emotional fluctuations. Discussed Effexor for vasomotor symptoms and emotional support. - Discuss potential use of Effexor for hot flashes and emotional symptoms. - Consider clonidine for nocturnal symptoms if sleep disturbances occur.

## 2023-07-19 NOTE — Telephone Encounter (Signed)
 Pharmacy Patient Advocate Encounter  Received notification from [facility] that Prior Authorization for Celecoxib 100MG  capsules has been APPROVED from 07/19/23 to 07/18/24. Ran test claim, Copay is $2.19. This test claim was processed through [facility]- copay amounts may vary at other pharmacies due to pharmacy/plan contracts, or as the patient moves through the different stages of their insurance plan.   PA #/Case ID/Reference #: [id number]

## 2023-07-21 ENCOUNTER — Other Ambulatory Visit: Payer: Self-pay | Admitting: Family Medicine

## 2023-07-21 ENCOUNTER — Encounter: Payer: Self-pay | Admitting: Family Medicine

## 2023-07-21 DIAGNOSIS — M533 Sacrococcygeal disorders, not elsewhere classified: Secondary | ICD-10-CM

## 2023-07-21 DIAGNOSIS — G8929 Other chronic pain: Secondary | ICD-10-CM

## 2023-07-27 NOTE — Telephone Encounter (Signed)
 You're welcome!

## 2023-08-02 DIAGNOSIS — M47816 Spondylosis without myelopathy or radiculopathy, lumbar region: Secondary | ICD-10-CM | POA: Diagnosis not present

## 2023-08-02 DIAGNOSIS — M533 Sacrococcygeal disorders, not elsewhere classified: Secondary | ICD-10-CM | POA: Diagnosis not present

## 2023-08-11 DIAGNOSIS — M533 Sacrococcygeal disorders, not elsewhere classified: Secondary | ICD-10-CM | POA: Diagnosis not present

## 2023-08-11 DIAGNOSIS — M6281 Muscle weakness (generalized): Secondary | ICD-10-CM | POA: Diagnosis not present

## 2023-08-11 DIAGNOSIS — M549 Dorsalgia, unspecified: Secondary | ICD-10-CM | POA: Diagnosis not present

## 2023-08-18 DIAGNOSIS — M6281 Muscle weakness (generalized): Secondary | ICD-10-CM | POA: Diagnosis not present

## 2023-08-18 DIAGNOSIS — M549 Dorsalgia, unspecified: Secondary | ICD-10-CM | POA: Diagnosis not present

## 2023-08-23 DIAGNOSIS — M6281 Muscle weakness (generalized): Secondary | ICD-10-CM | POA: Diagnosis not present

## 2023-08-23 DIAGNOSIS — M549 Dorsalgia, unspecified: Secondary | ICD-10-CM | POA: Diagnosis not present

## 2023-08-25 DIAGNOSIS — M549 Dorsalgia, unspecified: Secondary | ICD-10-CM | POA: Diagnosis not present

## 2023-08-25 DIAGNOSIS — M6281 Muscle weakness (generalized): Secondary | ICD-10-CM | POA: Diagnosis not present

## 2023-08-30 ENCOUNTER — Ambulatory Visit: Payer: BC Managed Care – PPO | Admitting: Dermatology

## 2023-08-30 DIAGNOSIS — M549 Dorsalgia, unspecified: Secondary | ICD-10-CM | POA: Diagnosis not present

## 2023-08-30 DIAGNOSIS — M6281 Muscle weakness (generalized): Secondary | ICD-10-CM | POA: Diagnosis not present

## 2023-09-11 DIAGNOSIS — M533 Sacrococcygeal disorders, not elsewhere classified: Secondary | ICD-10-CM | POA: Diagnosis not present

## 2023-09-13 DIAGNOSIS — M549 Dorsalgia, unspecified: Secondary | ICD-10-CM | POA: Diagnosis not present

## 2023-09-13 DIAGNOSIS — M6281 Muscle weakness (generalized): Secondary | ICD-10-CM | POA: Diagnosis not present

## 2023-09-15 DIAGNOSIS — M6281 Muscle weakness (generalized): Secondary | ICD-10-CM | POA: Diagnosis not present

## 2023-09-15 DIAGNOSIS — M549 Dorsalgia, unspecified: Secondary | ICD-10-CM | POA: Diagnosis not present

## 2023-09-26 ENCOUNTER — Ambulatory Visit: Admitting: Physical Therapy

## 2023-10-03 ENCOUNTER — Encounter: Admitting: Physical Therapy

## 2023-10-10 ENCOUNTER — Encounter: Admitting: Physical Therapy

## 2023-10-16 ENCOUNTER — Ambulatory Visit: Attending: Internal Medicine | Admitting: Physical Therapy

## 2023-10-16 DIAGNOSIS — M6281 Muscle weakness (generalized): Secondary | ICD-10-CM | POA: Insufficient documentation

## 2023-10-16 DIAGNOSIS — M62838 Other muscle spasm: Secondary | ICD-10-CM | POA: Diagnosis not present

## 2023-10-16 DIAGNOSIS — R278 Other lack of coordination: Secondary | ICD-10-CM | POA: Diagnosis not present

## 2023-10-16 DIAGNOSIS — N393 Stress incontinence (female) (male): Secondary | ICD-10-CM | POA: Insufficient documentation

## 2023-10-16 DIAGNOSIS — R2689 Other abnormalities of gait and mobility: Secondary | ICD-10-CM | POA: Diagnosis not present

## 2023-10-16 DIAGNOSIS — M533 Sacrococcygeal disorders, not elsewhere classified: Secondary | ICD-10-CM | POA: Insufficient documentation

## 2023-10-16 NOTE — Therapy (Signed)
 OUTPATIENT PHYSICAL THERAPY EVALUATION   Patient Name: Tiffany Zamora MRN: [id number] DOB:1971/02/24, 53 y.o., female Today's Date: 10/16/2023   PT End of Session - 10/16/23 0958     Visit Number 1    Number of Visits 10    Date for PT Re-Evaluation 12/25/23    Authorization Type 20 cal yr,    PT Start Time 0941    PT Stop Time 1020    PT Time Calculation (min) 39 min    Activity Tolerance Patient tolerated treatment well    Behavior During Therapy [facility] for tasks assessed/performed          Past Medical History:  Diagnosis Date   Benign hematuria ~2005   per pt normal workup with cystoscopy ([place] )   Heartburn    IBS (irritable bowel syndrome) 09/25/2015   Seasonal allergies    Urine incontinence    Past Surgical History:  Procedure Laterality Date   COLONOSCOPY  2000   for IBS sxs, WNL per patient   COLONOSCOPY WITH PROPOFOL  N/A 05/18/2020   Procedure: COLONOSCOPY WITH PROPOFOL ;  Surgeon: Unk Corinn Skiff, MD;  Location: [facility] ENDOSCOPY;  Service: Gastroenterology;  Laterality: N/A;   DILATION AND CURETTAGE OF UTERUS  08/2007   WISDOM TOOTH EXTRACTION  1993   Patient Active Problem List   Diagnosis Date Noted   Perimenopause 05/25/2023   Gastroesophageal reflux disease 05/25/2023   Moderate mixed hyperlipidemia not requiring statin therapy 05/25/2023   Frequency of urination 11/21/2022   Bilateral tinnitus 10/31/2022   Irritable bowel syndrome with both constipation and diarrhea 07/04/2022   Vitamin B12 deficiency 07/04/2022   Racing heart beat 05/30/2022   Mixed stress and urge urinary incontinence 03/31/2022   Urinary incontinence 05/23/2019   Shortness of breath 09/20/2017   Hypertriglyceridemia 09/25/2015   Benign hematuria     PCP: Prentice HAS   REFERRING PROVIDER: Bernardo, DO  REFERRING DIAG: Coccydynia  prolapse and urinary incontinence  Rationale for Evaluation and Treatment Rehabilitation  THERAPY DIAG:  Sacrococcygeal disorders, not  elsewhere classified  Other abnormalities of gait and mobility   ONSET DATE:   SUBJECTIVE:      SUBJECTIVE STATEMENT                                                                                                                                                                                        SUBJECTIVE STATEMENT ON EVAL 10/16/23 :   1)   Urinary leakage:  pt does not wear pads,  occurs with having to go to the bathroom or when laughing or sometimes with walking , hiking downhill    2) LBP occurs at L/S  location 1/10 and does back PT HEP. Pt notices standing for 30 min and the pain starts and 3/10  Eases with walking again    3) prolapse occurs when she is constipated  Stool consistency 4 is about 10-15% of the time, Type 3 across 85-90% of the time.   PERTINENT HISTORY:   Pt tried Pelvic PT in the past last Dec 2024 for 2 visits and she kept doing her exercises. They really helped her prolapse, LBP, incontinence.    Pt also started using estrogen cream which also helped a lot.   Pt went to [place]  recently  and rode the amusement park rides and only had one episode of leakage compared to more episodes wen she went last year. Pt hardly leaks when she laughs. Pt is so happy.   Hx of Vaginal delivery with perineal tears.   Pt enjoys hiking 3-10 miles per trip   PAIN:  Are you having pain? Yes: See above   PRECAUTIONS: No   WEIGHT BEARING RESTRICTIONS: No   FALLS:  Has patient fallen in last 6 months?  No   LIVING ENVIRONMENT: Lives with: husband, dtr, dog  Lives in: two story  Stairs:  2 STE with rail   OCCUPATION: stay home mom   PLOF: IND   PATIENT GOALS:  Learn how to get better to have no pain in the low back pain, tailbone, and for prolapse to go away and up    OBJECTIVE:    [facility] PT Assessment - 10/16/23 0955       AROM   Overall AROM Comments R sideflexion caused L LBP, sideflexion AROM wtith digit III to fllor 46 B,  R rotation  more limited  compared to L      Palpation   SI assessment  R shoulder / iliac crest lowered      Ambulation/Gait   Gait Comments 1.28 m/s,  Pelvic sway to R, limited R thoracic posterior rotaion            Self-Care   Self-Care Other Self-Care Comments    Other Self-Care Comments  explained regional interdependent approach to address pain at different body parts, explained upcoming visits to realign posture and spine.pelvis to improve Sx and achieve goals, showed anatomy images of deep core system ,      Therapeutic Activites    Therapeutic Activities Other Therapeutic Activities    Other Therapeutic Activities inquired about meaningful tasks and role of learning proper body mechanics, future sessions to help realign pelvis and spine and to learn co-activation of deep core system when performing against gravity tasks . These improvements will help address pain and Sx      Neuro Re-ed    Neuro Re-ed Details  cued for alignment and proprioception of pelvis and LKC and spine in  sit to stand and for proper sitting posture to activate deep core system          HOME EXERCISE PROGRAM: See pt instruction section    ASSESSMENT:  CLINICAL IMPRESSION: Pt is a  53 yo  who presents with  following issues which impact QOL, ADL,  fitness, social and community activities:    1)  Urinary leakage 2) LBP 3) prolapse   Pt's musculoskeletal assessment revealed uneven pelvic girdle and shoulder height, asymmetries to gait pattern, limited spinal /pelvic mobility, dyscoordination and strength of pelvic floor mm, hip weakness, poor body mechanics which places strain on the abdominal/pelvic floor mm. These are deficits that indicate an  ineffective intraabdominal pressure system associated with increased risk for pt's Sx.    Pt was provided education on etiology of Sx with anatomy, physiology explanation with images along with the benefits of customized pelvic PT Tx based on pt's medical conditions and  musculoskeletal deficits.  Explained the physiology of deep core mm coordination and roles of pelvic floor function in urination, defecation, sexual function, and postural control with deep core mm system, and the role of posture and alignment to help pelvic issues.    Regional interdependent approaches will yield greater benefits in pt's POC due to the complexity of pt's medical Hx and the significant impact their Sx have had on their QOL.    Pt was tearful during session and was provided compassionate presence. Pt declined wanting to share the source of her tears. Pt was explained a biopsychosocial approach which will be helpful to yield optimal outcomes.  Following Tx today which pt tolerated without complaints,  pt demo'd proper body mechanics to minimize straining pelvic floor.   Plan to address realignment of spine/ pelvis at next session to help promote optimize IAP system for improved pelvic floor function, trunk stability, gait, balance, stabilization with mobility tasks.  Plan to address pelvic floor issues once pelvis and spine are realigned to yield better outcomes.                                                       Pt benefits from skilled PT.    OBJECTIVE IMPAIRMENTS decreased activity tolerance, decreased coordination, decreased endurance, decreased mobility, difficulty walking, decreased ROM, decreased strength, decreased safety awareness, hypomobility, increased muscle spasms, impaired flexibility, improper body mechanics, postural dysfunction, and pain. scar restrictions   ACTIVITY LIMITATIONS  self-care, sitting, hiking   PARTICIPATION LIMITATIONS:  community   PERSONAL FACTORS   affecting patient's functional outcome:    REHAB POTENTIAL: Good   CLINICAL DECISION MAKING: Evolving/moderate complexity   EVALUATION COMPLEXITY: Moderate    PATIENT EDUCATION:    Education details: Showed pt anatomy images. Explained muscles attachments/ connection, physiology of deep  core system/ spinal- thoracic-pelvis-lower kinetic chain as they relate to pt's presentation, Sx, and past Hx. Explained what and how these areas of deficits need to be restored to balance and function    See Therapeutic activity / neuromuscular re-education section  Answered pt's questions.   Person educated: Patient Education method: Explanation, Demonstration, Tactile cues, Verbal cues, and Handouts Education comprehension: verbalized understanding, returned demonstration, verbal cues required, tactile cues required, and needs further education     PLAN: PT FREQUENCY: 1x/week   PT DURATION: 10 weeks   PLANNED INTERVENTIONS:   Gait training;Stair training;Functional mobility training;DME Instruction;Therapeutic activities;Therapeutic exercise;Balance training;Neuromuscular re-education;Patient/family education;Vestibular;Visual/perceptual remediation/compensation;Passive range of motion;Moist Heat;Cryotherapy;Traction;Canalith Repostioning;Joint Manipulations;Manual lymph drainage;Manual techniques;Scar mobilization;Energy conservation;Dry needling;ADLs/Self Care Home Management;Biofeedback;Electrical Stimulation;Taping    PLAN FOR NEXT SESSION: See clinical impression for plan     GOALS: Goals reviewed with patient? Yes  SHORT TERM GOALS: Target date: 11/13/2023    Pt will demo IND with HEP                    Baseline: Not IND            Goal status: INITIAL  LONG TERM GOALS: Target date:12/25/2023    1.Pt will demo proper deep core coordination without chest  breathing and optimal excursion of diaphragm/pelvic floor  and demo proper movement of pelvic floor for upward position of pelvic organs in order to promote spinal stability and pelvic floor function  Baseline: dyscoordination Goal status: INITIAL  2.  Pt will demo proper body mechanics in against gravity tasks and ADLs  work tasks, fitness  to minimize straining pelvic floor / back    Baseline: not IND, improper  form that places strain on pelvic floor  Goal status: INITIAL    3. Pt will demo increased gait speed > 1.4 m/s with reciprocal gait pattern, longer stride length  in order to ambulate safely in community and return to fitness routine  Baseline:1.28 m/s,  Pelvic sway to R, limited R thoracic posterior rotaion  Goal status: INITIAL    4. Pt will demo levelled pelvic girdle and shoulder height in order to progress to deep core strengthening HEP and restore mobility at spine, pelvis, gait, posture minimize falls, and return to hiking  Baseline: R shoulder /  iliac crest lowered  Goal status: INITIAL   5. Pt will improve PFDI-7 questionnaire to  pts  score change  to demo improved QOL  Baseline:    ( greater pts indicate greater negative impact on QOL)     pts  ( total)    pts  ( UIQ-7 )    pts  ( CRAIQ-7 )    pts  ( POPIQ-7 )  Baseline: plan to adminster  Goal status: INITIAL  6.  Pt will report: Stool type 4   > 50% of the time   Baseline:  Type 4 across 10-15 %   of the time, Type 3 - 85-90% of the time Goal status: INITIAL  7. Pt will report able to stand 1 hour and experience no LBP  in order to attend community events  Baseline:standing for 30 min and the pain starts and 3/10  Goal status: INITIAL       Pia Lupe Plump, PT 10/16/2023, 10:15 AM

## 2023-10-17 ENCOUNTER — Ambulatory Visit: Admitting: Physical Therapy

## 2023-10-17 NOTE — Patient Instructions (Signed)

## 2023-10-24 ENCOUNTER — Ambulatory Visit: Admitting: Physical Therapy

## 2023-10-24 DIAGNOSIS — R2689 Other abnormalities of gait and mobility: Secondary | ICD-10-CM

## 2023-10-24 DIAGNOSIS — M6281 Muscle weakness (generalized): Secondary | ICD-10-CM

## 2023-10-24 DIAGNOSIS — R278 Other lack of coordination: Secondary | ICD-10-CM | POA: Diagnosis not present

## 2023-10-24 DIAGNOSIS — M533 Sacrococcygeal disorders, not elsewhere classified: Secondary | ICD-10-CM

## 2023-10-24 DIAGNOSIS — N393 Stress incontinence (female) (male): Secondary | ICD-10-CM | POA: Diagnosis not present

## 2023-10-24 DIAGNOSIS — M62838 Other muscle spasm: Secondary | ICD-10-CM | POA: Diagnosis not present

## 2023-10-24 NOTE — Patient Instructions (Signed)
  Stretches :   Neck / shoulder stretches:    Lying on back - small sushi roll towel under neck  _ 6 directions   Chin up, down Rotation like changing lanes when driving Ear to shoulder like puppy dog   10 reps    _angel wings, lower elbows down , keep arms touching bed  10 reps    ____________  ZigZag stretch  Reclined twist for hips and side of the hips/ legs  Lay on your back, knees bend Scoot hips to the R , leave shoulders in place Wobble knees to the L side 45 deg and to midline  10 reps

## 2023-10-24 NOTE — Therapy (Unsigned)
 OUTPATIENT PHYSICAL THERAPY TREATMENT    Patient Name: Aaliah Jorgenson MRN: [id number] DOB:May 23, 1970, 53 y.o., female Today's Date: 10/24/2023   PT End of Session - 10/24/23 1422     Visit Number 2    Number of Visits 10    Date for PT Re-Evaluation 12/25/23    Authorization Type 20 cal yr,    PT Start Time 1415    PT Stop Time 1500    PT Time Calculation (min) 45 min    Activity Tolerance Patient tolerated treatment well    Behavior During Therapy [facility] for tasks assessed/performed          Past Medical History:  Diagnosis Date   Benign hematuria ~2005   per pt normal workup with cystoscopy ([place] )   Heartburn    IBS (irritable bowel syndrome) 09/25/2015   Seasonal allergies    Urine incontinence    Past Surgical History:  Procedure Laterality Date   COLONOSCOPY  2000   for IBS sxs, WNL per patient   COLONOSCOPY WITH PROPOFOL  N/A 05/18/2020   Procedure: COLONOSCOPY WITH PROPOFOL ;  Surgeon: Unk Corinn Skiff, MD;  Location: [facility] ENDOSCOPY;  Service: Gastroenterology;  Laterality: N/A;   DILATION AND CURETTAGE OF UTERUS  08/2007   WISDOM TOOTH EXTRACTION  1993   Patient Active Problem List   Diagnosis Date Noted   Perimenopause 05/25/2023   Gastroesophageal reflux disease 05/25/2023   Moderate mixed hyperlipidemia not requiring statin therapy 05/25/2023   Frequency of urination 11/21/2022   Bilateral tinnitus 10/31/2022   Irritable bowel syndrome with both constipation and diarrhea 07/04/2022   Vitamin B12 deficiency 07/04/2022   Racing heart beat 05/30/2022   Mixed stress and urge urinary incontinence 03/31/2022   Urinary incontinence 05/23/2019   Shortness of breath 09/20/2017   Hypertriglyceridemia 09/25/2015   Benign hematuria     PCP: Prentice HAS   REFERRING PROVIDER: Bernardo, DO  REFERRING DIAG: Coccydynia  prolapse and urinary incontinence  Rationale for Evaluation and Treatment Rehabilitation  THERAPY DIAG:  Sacrococcygeal disorders, not  elsewhere classified  Other abnormalities of gait and mobility   ONSET DATE:   SUBJECTIVE:      SUBJECTIVE STATEMENT  TODAY:   Pt reports her tailbone is better on soft surfaces since last session  but not on hard surfaces.      Pt is practicing not crossing legs.                                                                                                                                                                                   SUBJECTIVE STATEMENT ON EVAL 10/16/23 :   1)   Urinary leakage:  pt does not  wear pads,  occurs with having to go to the bathroom or when laughing or sometimes with walking , hiking downhill    2) LBP occurs at L/S location 1/10 and does back PT HEP. Pt notices standing for 30 min and the pain starts and 3/10  Eases with walking again    3) prolapse occurs when she is constipated  Stool consistency 4 is about 10-15% of the time, Type 3 across 85-90% of the time.   PERTINENT HISTORY:   Pt tried Pelvic PT in the past last Dec 2024 for 2 visits and she kept doing her exercises. They really helped her prolapse, LBP, incontinence.    Pt also started using estrogen cream which also helped a lot.   Pt went to [place]  recently  and rode the amusement park rides and only had one episode of leakage compared to more episodes wen she went last year. Pt hardly leaks when she laughs. Pt is so happy.   Hx of Vaginal delivery with perineal tears.   Pt enjoys hiking 3-10 miles per trip   PAIN:  Are you having pain? Yes: See above   PRECAUTIONS: No   WEIGHT BEARING RESTRICTIONS: No   FALLS:  Has patient fallen in last 6 months?  No   LIVING ENVIRONMENT: Lives with: husband, dtr, dog  Lives in: two story  Stairs:  2 STE with rail   OCCUPATION: stay home mom   PLOF: IND   PATIENT GOALS:  Learn how to get better to have no pain in the low back pain, tailbone, and for prolapse to go away and up    OBJECTIVE:        HOME EXERCISE  PROGRAM: See pt instruction section    ASSESSMENT:  CLINICAL IMPRESSION:       realignment of spine/ pelvis   Anticipate these improvements will continue to promote optimize IAP system for improved pelvic floor function, trunk stability, gait, balance, stabilization with mobility tasks.  Plan to address pelvic floor issues once pelvis and spine are realigned to yield better outcomes.                                                       Pt benefits from skilled PT.    OBJECTIVE IMPAIRMENTS decreased activity tolerance, decreased coordination, decreased endurance, decreased mobility, difficulty walking, decreased ROM, decreased strength, decreased safety awareness, hypomobility, increased muscle spasms, impaired flexibility, improper body mechanics, postural dysfunction, and pain. scar restrictions   ACTIVITY LIMITATIONS  self-care, sitting, hiking   PARTICIPATION LIMITATIONS:  community   PERSONAL FACTORS   affecting patient's functional outcome:    REHAB POTENTIAL: Good   CLINICAL DECISION MAKING: Evolving/moderate complexity   EVALUATION COMPLEXITY: Moderate    PATIENT EDUCATION:    Education details: Showed pt anatomy images. Explained muscles attachments/ connection, physiology of deep core system/ spinal- thoracic-pelvis-lower kinetic chain as they relate to pt's presentation, Sx, and past Hx. Explained what and how these areas of deficits need to be restored to balance and function    See Therapeutic activity / neuromuscular re-education section  Answered pt's questions.   Person educated: Patient Education method: Explanation, Demonstration, Tactile cues, Verbal cues, and Handouts Education comprehension: verbalized understanding, returned demonstration, verbal cues required, tactile cues required, and needs further education     PLAN: PT FREQUENCY:  1x/week   PT DURATION: 10 weeks   PLANNED INTERVENTIONS:   Gait training;Stair training;Functional  mobility training;DME Instruction;Therapeutic activities;Therapeutic exercise;Balance training;Neuromuscular re-education;Patient/family education;Vestibular;Visual/perceptual remediation/compensation;Passive range of motion;Moist Heat;Cryotherapy;Traction;Canalith Repostioning;Joint Manipulations;Manual lymph drainage;Manual techniques;Scar mobilization;Energy conservation;Dry needling;ADLs/Self Care Home Management;Biofeedback;Electrical Stimulation;Taping    PLAN FOR NEXT SESSION: See clinical impression for plan     GOALS: Goals reviewed with patient? Yes  SHORT TERM GOALS: Target date: 11/13/2023    Pt will demo IND with HEP                    Baseline: Not IND            Goal status: INITIAL  LONG TERM GOALS: Target date:12/25/2023    1.Pt will demo proper deep core coordination without chest breathing and optimal excursion of diaphragm/pelvic floor  and demo proper movement of pelvic floor for upward position of pelvic organs in order to promote spinal stability and pelvic floor function  Baseline: dyscoordination Goal status: INITIAL  2.  Pt will demo proper body mechanics in against gravity tasks and ADLs  work tasks, fitness  to minimize straining pelvic floor / back    Baseline: not IND, improper form that places strain on pelvic floor  Goal status: INITIAL    3. Pt will demo increased gait speed > 1.4 m/s with reciprocal gait pattern, longer stride length  in order to ambulate safely in community and return to fitness routine  Baseline:1.28 m/s,  Pelvic sway to R, limited R thoracic posterior rotaion  Goal status: INITIAL    4. Pt will demo levelled pelvic girdle and shoulder height in order to progress to deep core strengthening HEP and restore mobility at spine, pelvis, gait, posture minimize falls, and return to hiking  Baseline: R shoulder /  iliac crest lowered  Goal status: INITIAL   5. Pt will improve PFDI-7 questionnaire to  pts  score change  to demo  improved QOL  Baseline:    ( greater pts indicate greater negative impact on QOL)     pts  ( total)    pts  ( UIQ-7 )    pts  ( CRAIQ-7 )    pts  ( POPIQ-7 )  Baseline: plan to adminster  Goal status: INITIAL  6.  Pt will report: Stool type 4   > 50% of the time   Baseline:  Type 4 across 10-15 %   of the time, Type 3 - 85-90% of the time Goal status: INITIAL  7. Pt will report able to stand 1 hour and experience no LBP  in order to attend community events  Baseline:standing for 30 min and the pain starts and 3/10  Goal status: INITIAL       Pia Lupe Plump, PT 10/24/2023, 2:22 PM

## 2023-11-02 ENCOUNTER — Ambulatory Visit: Admitting: Physical Therapy

## 2023-11-02 DIAGNOSIS — M6281 Muscle weakness (generalized): Secondary | ICD-10-CM | POA: Diagnosis not present

## 2023-11-02 DIAGNOSIS — R278 Other lack of coordination: Secondary | ICD-10-CM

## 2023-11-02 DIAGNOSIS — N393 Stress incontinence (female) (male): Secondary | ICD-10-CM | POA: Diagnosis not present

## 2023-11-02 DIAGNOSIS — R2689 Other abnormalities of gait and mobility: Secondary | ICD-10-CM

## 2023-11-02 DIAGNOSIS — M62838 Other muscle spasm: Secondary | ICD-10-CM

## 2023-11-02 DIAGNOSIS — M533 Sacrococcygeal disorders, not elsewhere classified: Secondary | ICD-10-CM | POA: Diagnosis not present

## 2023-11-02 NOTE — Patient Instructions (Addendum)
 Stretches :   Neck / shoulder stretches:    Lying on back - small sushi roll towel under neck  _ 6 directions   Chin up, down Rotation like changing lanes when driving Ear to shoulder like puppy dog   10 reps    _angel wings, lower elbows down , keep arms touching bed  10 reps    ____________  ZigZag stretch  Reclined twist for hips and side of the hips/ legs  Lay on your back, knees bend Scoot hips to the R , leave shoulders in place Wobble knees to the L side 45 deg and to midline  10 reps    ___________    Karolynn pose rocking    Toes tucked, shoulders down and back, on forearms , hands shoulder width apart, fingers straight, elbow back , squeeze imaginary pencils in armpit, shoulder down and away from ears   10 reps    __   3 way childs pose ( center, to the R, to the L)        L hand on L hip, rotate and look up at the ceiling L  Repeat on both sides    __  Deep core level 1 ( handout)

## 2023-11-02 NOTE — Therapy (Addendum)
 OUTPATIENT PHYSICAL THERAPY TREATMENT    Patient Name: Tiffany Zamora MRN: [id number] DOB:1970-12-14, 53 y.o., female Today's Date: 11/02/2023   PT End of Session - 11/02/23 1339     Visit Number 3    Number of Visits 10    Date for PT Re-Evaluation 12/25/23    Authorization Type 20 cal yr,    PT Start Time 1335    PT Stop Time 1415    PT Time Calculation (min) 40 min    Activity Tolerance Patient tolerated treatment well    Behavior During Therapy [facility] for tasks assessed/performed          Past Medical History:  Diagnosis Date   Benign hematuria ~2005   per pt normal workup with cystoscopy ([place] )   Heartburn    IBS (irritable bowel syndrome) 09/25/2015   Seasonal allergies    Urine incontinence    Past Surgical History:  Procedure Laterality Date   COLONOSCOPY  2000   for IBS sxs, WNL per patient   COLONOSCOPY WITH PROPOFOL  N/A 05/18/2020   Procedure: COLONOSCOPY WITH PROPOFOL ;  Surgeon: Unk Corinn Skiff, MD;  Location: [facility] ENDOSCOPY;  Service: Gastroenterology;  Laterality: N/A;   DILATION AND CURETTAGE OF UTERUS  08/2007   WISDOM TOOTH EXTRACTION  1993   Patient Active Problem List   Diagnosis Date Noted   Perimenopause 05/25/2023   Gastroesophageal reflux disease 05/25/2023   Moderate mixed hyperlipidemia not requiring statin therapy 05/25/2023   Frequency of urination 11/21/2022   Bilateral tinnitus 10/31/2022   Irritable bowel syndrome with both constipation and diarrhea 07/04/2022   Vitamin B12 deficiency 07/04/2022   Racing heart beat 05/30/2022   Mixed stress and urge urinary incontinence 03/31/2022   Urinary incontinence 05/23/2019   Shortness of breath 09/20/2017   Hypertriglyceridemia 09/25/2015   Benign hematuria     PCP: Prentice HAS   REFERRING PROVIDER: Bernardo, DO  REFERRING DIAG: Coccydynia  prolapse and urinary incontinence  Rationale for Evaluation and Treatment Rehabilitation  THERAPY DIAG:  Sacrococcygeal disorders, not  elsewhere classified  Other abnormalities of gait and mobility   ONSET DATE:   SUBJECTIVE:      SUBJECTIVE STATEMENT  TODAY:   Pt reports her LBP resolved last week and she was able to stand talking to her neighbor for > 30 min and it did not hurt                                                                                                                                                                              SUBJECTIVE STATEMENT ON EVAL 10/16/23 :   1)   Urinary leakage:  pt does not wear pads,  occurs with having to go  to the bathroom or when laughing or sometimes with walking , hiking downhill    2) LBP occurs at L/S location 1/10 and does back PT HEP. Pt notices standing for 30 min and the pain starts and 3/10  Eases with walking again    3) prolapse occurs when she is constipated  Stool consistency 4 is about 10-15% of the time, Type 3 across 85-90% of the time.   PERTINENT HISTORY:   Pt tried Pelvic PT in the past last Dec 2024 for 2 visits and she kept doing her exercises. They really helped her prolapse, LBP, incontinence.    Pt also started using estrogen cream which also helped a lot.   Pt went to [place]  recently  and rode the amusement park rides and only had one episode of leakage compared to more episodes wen she went last year. Pt hardly leaks when she laughs. Pt is so happy.   Hx of Vaginal delivery with perineal tears.   Pt enjoys hiking 3-10 miles per trip   PAIN:  Are you having pain? Yes: See above   PRECAUTIONS: No   WEIGHT BEARING RESTRICTIONS: No   FALLS:  Has patient fallen in last 6 months?  No   LIVING ENVIRONMENT: Lives with: husband, dtr, dog  Lives in: two story  Stairs:  2 STE with rail   OCCUPATION: stay home mom   PLOF: IND   PATIENT GOALS:  Learn how to get better to have no pain in the low back pain, tailbone, and for prolapse to go away and up    OBJECTIVE:    [facility] PT Assessment - 11/02/23 1341       Coordination    Coordination and Movement Description upper trap overuse, chest breathing , dyscoordination of diaphragm      Palpation   SI assessment  levelled pelvis/ shoulders, L cervical tension    Palpation comment T1-3 deviated to L tenderness ,          [facility] Adult PT Treatment/Exercise - 11/02/23 1457       Neuro Re-ed    Neuro Re-ed Details  cued for more stretches for upright posture, optimal diaphramgatic excursion ,cued for alignment in new HEP to improve posture      Manual Therapy   Manual therapy comments STM/MWM at problem areas noted in assessment to realign spine and improve mobility of spine  and improve optimal diaphragm excursion for deep core function              HOME EXERCISE PROGRAM: See pt instruction section    ASSESSMENT:  CLINICAL IMPRESSION:  Improvements include:   Visit 2 :  Pt reports her tailbone is better on soft surfaces since last session  but not on hard surfaces.      Pt is practicing not crossing legs.       Vist 3 :  Pt reports her LBP resolved last week and she was able to stand talking to her neighbor for > 30 min and it did not hurt    Levelled pelvis and shoulder but C/T junciton remain hypomobile.      Manual Tx addressed hypomobility C/T to realign spine and improve mobility of spine  and improve optimal diaphragm excursion for deep core function .   Cued for stretches for upright posture, optimal diaphramgatic excursion ,cued for alignment in new HEP to improve posture   Plan to add more stretches to minimize adducted knees. Pt enjoys cycling classes and will  benefit from complimentary stretches to minimize overactive pelvic floor./ adducted knees/ hip IR.   Progress to deep core level 2  training at upcoming session   Anticipate these improvements will continue to promote optimize IAP system for improved pelvic floor function, trunk stability, gait, balance, stabilization with mobility tasks.  Plan to address pelvic floor issues  once pelvis and spine are realigned to yield better outcomes.                                                       Pt benefits from skilled PT.    OBJECTIVE IMPAIRMENTS decreased activity tolerance, decreased coordination, decreased endurance, decreased mobility, difficulty walking, decreased ROM, decreased strength, decreased safety awareness, hypomobility, increased muscle spasms, impaired flexibility, improper body mechanics, postural dysfunction, and pain. scar restrictions   ACTIVITY LIMITATIONS  self-care, sitting, hiking   PARTICIPATION LIMITATIONS:  community   PERSONAL FACTORS   affecting patient's functional outcome:    REHAB POTENTIAL: Good   CLINICAL DECISION MAKING: Evolving/moderate complexity   EVALUATION COMPLEXITY: Moderate    PATIENT EDUCATION:    Education details: Showed pt anatomy images. Explained muscles attachments/ connection, physiology of deep core system/ spinal- thoracic-pelvis-lower kinetic chain as they relate to pt's presentation, Sx, and past Hx. Explained what and how these areas of deficits need to be restored to balance and function    See Therapeutic activity / neuromuscular re-education section  Answered pt's questions.   Person educated: Patient Education method: Explanation, Demonstration, Tactile cues, Verbal cues, and Handouts Education comprehension: verbalized understanding, returned demonstration, verbal cues required, tactile cues required, and needs further education     PLAN: PT FREQUENCY: 1x/week   PT DURATION: 10 weeks   PLANNED INTERVENTIONS:   Gait training;Stair training;Functional mobility training;DME Instruction;Therapeutic activities;Therapeutic exercise;Balance training;Neuromuscular re-education;Patient/family education;Vestibular;Visual/perceptual remediation/compensation;Passive range of motion;Moist Heat;Cryotherapy;Traction;Canalith Repostioning;Joint Manipulations;Manual lymph drainage;Manual techniques;Scar  mobilization;Energy conservation;Dry needling;ADLs/Self Care Home Management;Biofeedback;Electrical Stimulation;Taping    PLAN FOR NEXT SESSION: See clinical impression for plan     GOALS: Goals reviewed with patient? Yes  SHORT TERM GOALS: Target date: 11/13/2023    Pt will demo IND with HEP                    Baseline: Not IND            Goal status: INITIAL  LONG TERM GOALS: Target date:12/25/2023    1.Pt will demo proper deep core coordination without chest breathing and optimal excursion of diaphragm/pelvic floor  and demo proper movement of pelvic floor for upward position of pelvic organs in order to promote spinal stability and pelvic floor function  Baseline: dyscoordination Goal status: INITIAL  2.  Pt will demo proper body mechanics in against gravity tasks and ADLs  work tasks, fitness  to minimize straining pelvic floor / back    Baseline: not IND, improper form that places strain on pelvic floor  Goal status: INITIAL    3. Pt will demo increased gait speed > 1.4 m/s with reciprocal gait pattern, longer stride length  in order to ambulate safely in community and return to fitness routine  Baseline:1.28 m/s,  Pelvic sway to R, limited R thoracic posterior rotaion  Goal status: INITIAL    4. Pt will demo levelled pelvic girdle and shoulder height in order to progress to deep core strengthening  HEP and restore mobility at spine, pelvis, gait, posture minimize falls, and return to hiking  Baseline: R shoulder /  iliac crest lowered  Goal status: INITIAL   5. Pt will improve PFDI-7 questionnaire to  pts  score change  to demo improved QOL  Baseline:    ( greater pts indicate greater negative impact on QOL)    38 pts  ( total)  33  pts  ( UIQ-7 )   0 pts  ( CRAIQ-7 )  5   pts  ( POPIQ-7 )  Baseline: plan to adminster  Goal status: INITIAL  6.  Pt will report: Stool type 4   > 50% of the time   Baseline:  Type 4 across 10-15 %   of the time, Type 3 -  85-90% of the time Goal status: INITIAL  7. Pt will report able to stand 1 hour and experience no LBP  in order to attend community events  Baseline:standing for 30 min and the pain starts and 3/10  Goal status: INITIAL       Pia Lupe Plump, PT 11/02/2023, 1:40 PM

## 2023-11-08 ENCOUNTER — Ambulatory Visit: Attending: Internal Medicine | Admitting: Physical Therapy

## 2023-11-08 DIAGNOSIS — M533 Sacrococcygeal disorders, not elsewhere classified: Secondary | ICD-10-CM | POA: Insufficient documentation

## 2023-11-08 DIAGNOSIS — M6281 Muscle weakness (generalized): Secondary | ICD-10-CM | POA: Diagnosis not present

## 2023-11-08 DIAGNOSIS — R2689 Other abnormalities of gait and mobility: Secondary | ICD-10-CM | POA: Insufficient documentation

## 2023-11-08 DIAGNOSIS — M62838 Other muscle spasm: Secondary | ICD-10-CM | POA: Insufficient documentation

## 2023-11-08 DIAGNOSIS — R278 Other lack of coordination: Secondary | ICD-10-CM | POA: Insufficient documentation

## 2023-11-08 NOTE — Therapy (Unsigned)
 OUTPATIENT PHYSICAL THERAPY TREATMENT    Patient Name: Tiffany Zamora MRN: [id number] DOB:12-Jun-1970, 53 y.o., female Today's Date: 11/08/2023   PT End of Session - 11/08/23 1554     Visit Number 4    Number of Visits 10    Date for PT Re-Evaluation 12/25/23    Authorization Type 20 cal yr,    PT Start Time 1550    PT Stop Time 1630    PT Time Calculation (min) 40 min    Activity Tolerance Patient tolerated treatment well    Behavior During Therapy [facility] for tasks assessed/performed          Past Medical History:  Diagnosis Date   Benign hematuria ~2005   per pt normal workup with cystoscopy ([place] )   Heartburn    IBS (irritable bowel syndrome) 09/25/2015   Seasonal allergies    Urine incontinence    Past Surgical History:  Procedure Laterality Date   COLONOSCOPY  2000   for IBS sxs, WNL per patient   COLONOSCOPY WITH PROPOFOL  N/A 05/18/2020   Procedure: COLONOSCOPY WITH PROPOFOL ;  Surgeon: Unk Corinn Skiff, MD;  Location: [facility] END[facility]COPY;  Service: Gastroenterology;  Laterality: N/A;   DILATION AND CURETTAGE OF UTERUS  08/2007   WISDOM TOOTH EXTRACTION  1993   Patient Active Problem List   Diagnosis Date Noted   Perimenopause 05/25/2023   Gastroesophageal reflux disease 05/25/2023   Moderate mixed hyperlipidemia not requiring statin therapy 05/25/2023   Frequency of urination 11/21/2022   Bilateral tinnitus 10/31/2022   Irritable bowel syndrome with both constipation and diarrhea 07/04/2022   Vitamin B12 deficiency 07/04/2022   Racing heart beat 05/30/2022   Mixed stress and urge urinary incontinence 03/31/2022   Urinary incontinence 05/23/2019   Shortness of breath 09/20/2017   Hypertriglyceridemia 09/25/2015   Benign hematuria     PCP: Prentice HAS   REFERRING PROVIDER: Bernardo, DO  REFERRING DIAG: Coccydynia  prolapse and urinary incontinence  Rationale for Evaluation and Treatment Rehabilitation  THERAPY DIAG:  Sacrococcygeal disorders, not  elsewhere classified  Other abnormalities of gait and mobility   ONSET DATE:   SUBJECTIVE:      SUBJECTIVE STATEMENT  TODAY:   Pt reports tailbone seems better and she was able to kayak 2.5 hours  with minimal tailbone pain            Pt felt there was tingling and numbness down L arm. L leg and L buttocks after last session which lasted for days. It has not occurred since. Pt sat on heating pad and it helps       Pt has [place] 2 [place] and she noticed no leakage.                                                                                                                                        LBP continues to be resolved across 3  weeks .  SUBJECTIVE STATEMENT ON EVAL 10/16/23 :   1)   Urinary leakage:  pt does not wear pads,  occurs with having to go to the bathroom or when laughing or sometimes with walking , hiking downhill    2) LBP occurs at L/S location 1/10 and does back PT HEP. Pt notices standing for 30 min and the pain starts and 3/10  Eases with walking again    3) prolapse occurs when she is constipated  Stool consistency 4 is about 10-15% of the time, Type 3 across 85-90% of the time.   PERTINENT HISTORY:   Pt tried Pelvic PT in the past last Dec 2024 for 2 visits and she kept doing her exercises. They really helped her prolapse, LBP, incontinence.    Pt also started using estrogen cream which also helped a lot.   Pt went to [place]  recently  and rode the amusement park rides and only had one episode of leakage compared to more episodes wen she went last year. Pt hardly leaks when she laughs. Pt is so happy that has improved .   Hx of Vaginal delivery with perineal tears.   Pt enjoys hiking 3-10 miles per trip   PAIN:  Are you having pain? Yes: See above   PRECAUTIONS: No   WEIGHT BEARING RESTRICTIONS: No   FALLS:  Has patient fallen in last 6 months?  No   LIVING ENVIRONMENT: Lives with: husband, dtr, dog  Lives in: two story  Stairs:   2 STE with rail   OCCUPATION: stay home mom   PLOF: IND   PATIENT GOALS:  Learn how to get better to have no pain in the low back pain, tailbone, and for prolapse to go away and up    OBJECTIVE:       HOME EXERCISE PROGRAM: See pt instruction section    ASSESSMENT:  CLINICAL IMPRESSION:  Improvements include:   Visit 2 :  Pt reports her tailbone is better on soft surfaces since last session  but not on hard surfaces.      Pt is practicing not crossing legs.       Vist 3 :  Pt reports her LBP resolved last week and she was able to stand talking to her neighbor for > 30 min and it did not hurt   Visit 4:   LBP continues to be resolved across 3 weeks .  Visit 4:  Pt reports tailbone seems better and she was able to kayak 2.5 hours  with minimal tailbone pain       Cued for stretches for upright posture, optimal diaphramgatic excursion ,cued for alignment in new HEP to improve posture   Plan to add more stretches to minimize adducted knees. Pt enjoys cycling classes and will benefit from complimentary stretches to minimize overactive pelvic floor./ adducted knees/ hip IR.   Progress to deep core level 2  training at upcoming session   Anticipate these improvements will continue to promote optimize IAP system for improved pelvic floor function, trunk stability, gait, balance, stabilization with mobility tasks.  Plan to address pelvic floor issues once pelvis and spine are realigned to yield better outcomes.  Pt benefits from skilled PT.    OBJECTIVE IMPAIRMENTS decreased activity tolerance, decreased coordination, decreased endurance, decreased mobility, difficulty walking, decreased ROM, decreased strength, decreased safety awareness, hypomobility, increased muscle spasms, impaired flexibility, improper body mechanics, postural dysfunction, and pain. scar restrictions   ACTIVITY LIMITATIONS  self-care, sitting,  hiking   PARTICIPATION LIMITATIONS:  community   PERSONAL FACTORS   affecting patient's functional outcome:    REHAB POTENTIAL: Good   CLINICAL DECISION MAKING: Evolving/moderate complexity   EVALUATION COMPLEXITY: Moderate    PATIENT EDUCATION:    Education details: Showed pt anatomy images. Explained muscles attachments/ connection, physiology of deep core system/ spinal- thoracic-pelvis-lower kinetic chain as they relate to pt's presentation, Sx, and past Hx. Explained what and how these areas of deficits need to be restored to balance and function    See Therapeutic activity / neuromuscular re-education section  Answered pt's questions.   Person educated: Patient Education method: Explanation, Demonstration, Tactile cues, Verbal cues, and Handouts Education comprehension: verbalized understanding, returned demonstration, verbal cues required, tactile cues required, and needs further education     PLAN: PT FREQUENCY: 1x/week   PT DURATION: 10 weeks   PLANNED INTERVENTIONS:   Gait training;Stair training;Functional mobility training;DME Instruction;Therapeutic activities;Therapeutic exercise;Balance training;Neuromuscular re-education;Patient/family education;Vestibular;Visual/perceptual remediation/compensation;Passive range of motion;Moist Heat;Cryotherapy;Traction;Canalith Repostioning;Joint Manipulations;Manual lymph drainage;Manual techniques;Scar mobilization;Energy conservation;Dry needling;ADLs/Self Care Home Management;Biofeedback;Electrical Stimulation;Taping    PLAN FOR NEXT SESSION: See clinical impression for plan     GOALS: Goals reviewed with patient? Yes  SHORT TERM GOALS: Target date: 11/13/2023    Pt will demo IND with HEP                    Baseline: Not IND            Goal status: INITIAL  LONG TERM GOALS: Target date:12/25/2023    1.Pt will demo proper deep core coordination without chest breathing and optimal excursion of diaphragm/pelvic  floor  and demo proper movement of pelvic floor for upward position of pelvic organs in order to promote spinal stability and pelvic floor function  Baseline: dyscoordination Goal status: INITIAL  2.  Pt will demo proper body mechanics in against gravity tasks and ADLs  work tasks, fitness  to minimize straining pelvic floor / back    Baseline: not IND, improper form that places strain on pelvic floor  Goal status: INITIAL    3. Pt will demo increased gait speed > 1.4 m/s with reciprocal gait pattern, longer stride length  in order to ambulate safely in community and return to fitness routine  Baseline:1.28 m/s,  Pelvic sway to R, limited R thoracic posterior rotaion  Goal status: INITIAL    4. Pt will demo levelled pelvic girdle and shoulder height in order to progress to deep core strengthening HEP and restore mobility at spine, pelvis, gait, posture minimize falls, and return to hiking  Baseline: R shoulder /  iliac crest lowered  Goal status: INITIAL   5. Pt will improve PFDI-7 questionnaire to  pts  score change  to demo improved QOL  Baseline:    ( greater pts indicate greater negative impact on QOL)    38 pts  ( total)  33  pts  ( UIQ-7 )   0 pts  ( CRAIQ-7 )  5   pts  ( POPIQ-7 )  Baseline: plan to adminster  Goal status: INITIAL  6.  Pt will report: Stool type 4   > 50% of the time   Baseline:  Type 4 across 10-15 %   of the time, Type 3 - 85-90% of the time Goal status: INITIAL  7. Pt will report able to stand 1 hour and experience no LBP  in order to attend community events  Baseline:standing for 30 min and the pain starts and 3/10  Goal status: INITIAL       Pia Lupe Plump, PT 11/08/2023, 3:55 PM

## 2023-11-09 ENCOUNTER — Encounter: Payer: Self-pay | Admitting: Cardiology

## 2023-11-13 ENCOUNTER — Ambulatory Visit: Admitting: Physical Therapy

## 2023-11-13 DIAGNOSIS — M6281 Muscle weakness (generalized): Secondary | ICD-10-CM | POA: Diagnosis not present

## 2023-11-13 DIAGNOSIS — R278 Other lack of coordination: Secondary | ICD-10-CM | POA: Diagnosis not present

## 2023-11-13 DIAGNOSIS — M533 Sacrococcygeal disorders, not elsewhere classified: Secondary | ICD-10-CM | POA: Diagnosis not present

## 2023-11-13 DIAGNOSIS — R2689 Other abnormalities of gait and mobility: Secondary | ICD-10-CM

## 2023-11-13 DIAGNOSIS — M62838 Other muscle spasm: Secondary | ICD-10-CM | POA: Diagnosis not present

## 2023-11-13 NOTE — Patient Instructions (Signed)
 Stretches :     Lengthen Back rib by L  shoulder ( winging)  Lie on R  side , pillow between knees and under head  Pull  L arm overhead over mattress, grab the edge of mattress,pull it upward, drawing elbow away from ears  Breathing 10 reps Brushing arm with 3/4 turn onto pillow behind back  Lying on R  side ,Pillow/ Block between knees  dragging top forearm across ribs below breast rotating 3/4 turn,  rotating  _L_ only this week ,  relax onto the pillow behind the back  and then back to other palm , maintain top palm on body whole top and not lift shoulder Do this side this week       Wait do both sides until we have levelled out your spine and shoulders ___ Lengthen Back rib by R  shoulder   ( winging)  Lie on L  side , pillow between knees and under head  Pull  R arm overhead over mattress, grab the edge of mattress,pull it upward, drawing elbow away from ears  Breathing 10 reps Brushing arm with 3/4 turn onto pillow behind back  Lying on L  side ,Pillow/ Block between knees  dragging top forearm across ribs below breast rotating 3/4 turn,  rotating  _R_ only this week ,  relax onto the pillow behind the back  and then back to other palm , maintain top palm on body whole top and not lift shoulder Do this side this week       Wait do both sides until we have levelled out your spine  __  Twice a day    Motion is lotion  - good morning and evening     On your side - winging and brushing   Pillow between knees and behind back     On your other side - winging and brushing   Pillow between knees and behind back       On your back with towel roll under the neck  - neck 6 directions - angel wings - ( dragging arms)  - ZigZag stretch scoot hips to one side and rock knees to opposite, arms by side ,palms up      Deep core level 1-2  *handout_

## 2023-11-13 NOTE — Therapy (Unsigned)
 OUTPATIENT PHYSICAL THERAPY TREATMENT    Patient Name: Tiffany Zamora MRN: [id number] DOB:1970/07/23, 53 y.o., female Today's Date: 11/13/2023   PT End of Session - 11/13/23 9062     Visit Number 5    Number of Visits 10    Date for PT Re-Evaluation 12/25/23    Authorization Type 20 cal yr,    PT Start Time 0930    PT Stop Time 1015    PT Time Calculation (min) 45 min    Activity Tolerance Patient tolerated treatment well    Behavior During Therapy [facility] for tasks assessed/performed          Past Medical History:  Diagnosis Date   Benign hematuria ~2005   per pt normal workup with cystoscopy ([place] )   Heartburn    IBS (irritable bowel syndrome) 09/25/2015   Seasonal allergies    Urine incontinence    Past Surgical History:  Procedure Laterality Date   COLONOSCOPY  2000   for IBS sxs, WNL per patient   COLONOSCOPY WITH PROPOFOL  N/A 05/18/2020   Procedure: COLONOSCOPY WITH PROPOFOL ;  Surgeon: Unk Corinn Skiff, MD;  Location: [facility] ENDOSCOPY;  Service: Gastroenterology;  Laterality: N/A;   DILATION AND CURETTAGE OF UTERUS  08/2007   WISDOM TOOTH EXTRACTION  1993   Patient Active Problem List   Diagnosis Date Noted   Perimenopause 05/25/2023   Gastroesophageal reflux disease 05/25/2023   Moderate mixed hyperlipidemia not requiring statin therapy 05/25/2023   Frequency of urination 11/21/2022   Bilateral tinnitus 10/31/2022   Irritable bowel syndrome with both constipation and diarrhea 07/04/2022   Vitamin B12 deficiency 07/04/2022   Racing heart beat 05/30/2022   Mixed stress and urge urinary incontinence 03/31/2022   Urinary incontinence 05/23/2019   Shortness of breath 09/20/2017   Hypertriglyceridemia 09/25/2015   Benign hematuria     PCP: Prentice HAS   REFERRING PROVIDER: Bernardo, DO  REFERRING DIAG: Coccydynia  prolapse and urinary incontinence  Rationale for Evaluation and Treatment Rehabilitation  THERAPY DIAG:  Sacrococcygeal disorders, not  elsewhere classified  Other abnormalities of gait and mobility   ONSET DATE:   SUBJECTIVE:      SUBJECTIVE STATEMENT  TODAY:   Pt reports tailbone seems better with longer times on soft chairs and there is still pain with harder chairs     SUBJECTIVE STATEMENT ON EVAL 10/16/23 :   1)   Urinary leakage:  pt does not wear pads,  occurs with having to go to the bathroom or when laughing or sometimes with walking , hiking downhill    2) LBP occurs at L/S location 1/10 and does back PT HEP. Pt notices standing for 30 min and the pain starts and 3/10  Eases with walking again    3) prolapse occurs when she is constipated  Stool consistency 4 is about 10-15% of the time, Type 3 across 85-90% of the time.   PERTINENT HISTORY:   Pt tried Pelvic PT in the past last Dec 2024 for 2 visits and she kept doing her exercises. They really helped her prolapse, LBP, incontinence.    Pt also started using estrogen cream which also helped a lot.   Pt went to [place]  recently  and rode the amusement park rides and only had one episode of leakage compared to more episodes wen she went last year. Pt hardly leaks when she laughs. Pt is so happy that has improved .   Hx of Vaginal delivery with perineal tears.   Pt enjoys  hiking 3-10 miles per trip   PAIN:  Are you having pain? Yes: See above   PRECAUTIONS: No   WEIGHT BEARING RESTRICTIONS: No   FALLS:  Has patient fallen in last 6 months?  No   LIVING ENVIRONMENT: Lives with: husband, dtr, dog  Lives in: two story  Stairs:  2 STE with rail   OCCUPATION: stay home mom   PLOF: IND   PATIENT GOALS:  Learn how to get better to have no pain in the low back pain, tailbone, and for prolapse to go away and up    OBJECTIVE:         HOME EXERCISE PROGRAM: See pt instruction section    ASSESSMENT:  CLINICAL IMPRESSION:  Improvements include:   Visit 2 :  Pt reports her tailbone is better on soft surfaces since last session   but not on hard surfaces.      Pt is practicing not crossing legs.       Vist 3 :  Pt reports her LBP resolved last week and she was able to stand talking to her neighbor for > 30 min and it did not hurt   Visit 4:   LBP continues to be resolved across 3 weeks .  Visit 4:  Pt reports tailbone seems better and she was able to kayak 2.5 hours  with minimal tailbone pain    Visit 5:  Pt reports less tailbone pain with soft chairs .    Continued to require manual Tx to realign spine and improve mobility of spine  and improve optimal diaphragm excursion for deep core function . Modified technique to accmondate comfort when pt reported tenderness . Guided relaxation and explained principles of restorative yoga and explained the role of relaxation for pelvic issues for long term benefits  Progress to deep core level 2  training at upcoming session  Cued for stretches for upright posture, optimal diaphramgatic excursion ,cued for alignment in new HEP to improve posture   Plan to add more stretches to minimize adducted knees. Pt enjoys cycling classes and will benefit from complimentary stretches to minimize overactive pelvic floor./ adducted knees/ hip IR.     Anticipate these improvements will continue to promote optimize IAP system for improved pelvic floor function, trunk stability, gait, balance, stabilization with mobility tasks.  Plan to address pelvic floor issues once pelvis and spine are realigned to yield better outcomes.                                                       Pt benefits from skilled PT.    OBJECTIVE IMPAIRMENTS decreased activity tolerance, decreased coordination, decreased endurance, decreased mobility, difficulty walking, decreased ROM, decreased strength, decreased safety awareness, hypomobility, increased muscle spasms, impaired flexibility, improper body mechanics, postural dysfunction, and pain. scar restrictions   ACTIVITY LIMITATIONS  self-care, sitting, hiking    PARTICIPATION LIMITATIONS:  community   PERSONAL FACTORS   affecting patient's functional outcome:    REHAB POTENTIAL: Good   CLINICAL DECISION MAKING: Evolving/moderate complexity   EVALUATION COMPLEXITY: Moderate    PATIENT EDUCATION:    Education details: Showed pt anatomy images. Explained muscles attachments/ connection, physiology of deep core system/ spinal- thoracic-pelvis-lower kinetic chain as they relate to pt's presentation, Sx, and past Hx. Explained what and how these areas of deficits need  to be restored to balance and function    See Therapeutic activity / neuromuscular re-education section  Answered pt's questions.   Person educated: Patient Education method: Explanation, Demonstration, Tactile cues, Verbal cues, and Handouts Education comprehension: verbalized understanding, returned demonstration, verbal cues required, tactile cues required, and needs further education     PLAN: PT FREQUENCY: 1x/week   PT DURATION: 10 weeks   PLANNED INTERVENTIONS:   Gait training;Stair training;Functional mobility training;DME Instruction;Therapeutic activities;Therapeutic exercise;Balance training;Neuromuscular re-education;Patient/family education;Vestibular;Visual/perceptual remediation/compensation;Passive range of motion;Moist Heat;Cryotherapy;Traction;Canalith Repostioning;Joint Manipulations;Manual lymph drainage;Manual techniques;Scar mobilization;Energy conservation;Dry needling;ADLs/Self Care Home Management;Biofeedback;Electrical Stimulation;Taping    PLAN FOR NEXT SESSION: See clinical impression for plan     GOALS: Goals reviewed with patient? Yes  SHORT TERM GOALS: Target date: 11/13/2023    Pt will demo IND with HEP                    Baseline: Not IND            Goal status: INITIAL  LONG TERM GOALS: Target date:12/25/2023    1.Pt will demo proper deep core coordination without chest breathing and optimal excursion of diaphragm/pelvic floor  and  demo proper movement of pelvic floor for upward position of pelvic organs in order to promote spinal stability and pelvic floor function  Baseline: dyscoordination Goal status: INITIAL  2.  Pt will demo proper body mechanics in against gravity tasks and ADLs  work tasks, fitness  to minimize straining pelvic floor / back    Baseline: not IND, improper form that places strain on pelvic floor  Goal status: INITIAL    3. Pt will demo increased gait speed > 1.4 m/s with reciprocal gait pattern, longer stride length  in order to ambulate safely in community and return to fitness routine  Baseline:1.28 m/s,  Pelvic sway to R, limited R thoracic posterior rotaion  Goal status: INITIAL    4. Pt will demo levelled pelvic girdle and shoulder height in order to progress to deep core strengthening HEP and restore mobility at spine, pelvis, gait, posture minimize falls, and return to hiking  Baseline: R shoulder /  iliac crest lowered  Goal status: INITIAL   5. Pt will improve PFDI-7 questionnaire to  pts  score change  to demo improved QOL  Baseline:    ( greater pts indicate greater negative impact on QOL)    38 pts  ( total)  33  pts  ( UIQ-7 )   0 pts  ( CRAIQ-7 )  5   pts  ( POPIQ-7 )  Baseline: plan to adminster  Goal status: INITIAL  6.  Pt will report: Stool type 4   > 50% of the time   Baseline:  Type 4 across 10-15 %   of the time, Type 3 - 85-90% of the time Goal status: INITIAL  7. Pt will report able to stand 1 hour and experience no LBP  in order to attend community events  Baseline:standing for 30 min and the pain starts and 3/10  Goal status: INITIAL       Pia Lupe Plump, PT 11/13/2023, 9:38 AM

## 2023-11-16 ENCOUNTER — Telehealth: Payer: Self-pay

## 2023-11-16 NOTE — Telephone Encounter (Signed)
 Copied from CRM #[id number]. Topic: Clinical - Request for Lab/Test Order >> Nov 16, 2023  1:35 PM Tiffany Zamora wrote: Reason for CRM: would like to schedule labs before physical appt- [phone]

## 2023-11-20 ENCOUNTER — Other Ambulatory Visit: Payer: Self-pay | Admitting: Internal Medicine

## 2023-11-20 DIAGNOSIS — Z0000 Encounter for general adult medical examination without abnormal findings: Secondary | ICD-10-CM

## 2023-11-20 DIAGNOSIS — Z13220 Encounter for screening for lipoid disorders: Secondary | ICD-10-CM

## 2023-11-20 DIAGNOSIS — E559 Vitamin D deficiency, unspecified: Secondary | ICD-10-CM

## 2023-11-20 NOTE — Telephone Encounter (Signed)
 Left message for patient

## 2023-11-21 ENCOUNTER — Ambulatory Visit: Admitting: Physical Therapy

## 2023-11-22 ENCOUNTER — Other Ambulatory Visit: Payer: Self-pay | Admitting: Emergency Medicine

## 2023-11-22 DIAGNOSIS — Z13220 Encounter for screening for lipoid disorders: Secondary | ICD-10-CM | POA: Diagnosis not present

## 2023-11-22 DIAGNOSIS — E559 Vitamin D deficiency, unspecified: Secondary | ICD-10-CM | POA: Diagnosis not present

## 2023-11-22 DIAGNOSIS — R5383 Other fatigue: Secondary | ICD-10-CM

## 2023-11-22 DIAGNOSIS — Z0000 Encounter for general adult medical examination without abnormal findings: Secondary | ICD-10-CM | POA: Diagnosis not present

## 2023-11-22 DIAGNOSIS — E538 Deficiency of other specified B group vitamins: Secondary | ICD-10-CM | POA: Diagnosis not present

## 2023-11-23 ENCOUNTER — Ambulatory Visit: Admitting: Physical Therapy

## 2023-11-23 DIAGNOSIS — R2689 Other abnormalities of gait and mobility: Secondary | ICD-10-CM

## 2023-11-23 DIAGNOSIS — M533 Sacrococcygeal disorders, not elsewhere classified: Secondary | ICD-10-CM | POA: Diagnosis not present

## 2023-11-23 DIAGNOSIS — M62838 Other muscle spasm: Secondary | ICD-10-CM

## 2023-11-23 DIAGNOSIS — R278 Other lack of coordination: Secondary | ICD-10-CM | POA: Diagnosis not present

## 2023-11-23 DIAGNOSIS — M6281 Muscle weakness (generalized): Secondary | ICD-10-CM | POA: Diagnosis not present

## 2023-11-23 NOTE — Therapy (Signed)
 OUTPATIENT PHYSICAL THERAPY TREATMENT    Patient Name: Tiffany Zamora MRN: [id number] DOB:10-Jan-1971, 53 y.o., female Today's Date: 11/23/2023   PT End of Session - 11/23/23 0910     Visit Number 6    Number of Visits 10    Date for PT Re-Evaluation 12/25/23    Authorization Type 20 cal yr,    PT Start Time 0850    PT Stop Time 0930    PT Time Calculation (min) 40 min    Activity Tolerance Patient tolerated treatment well    Behavior During Therapy [facility] for tasks assessed/performed          Past Medical History:  Diagnosis Date   Benign hematuria ~2005   per pt normal workup with cystoscopy ([place] )   Heartburn    IBS (irritable bowel syndrome) 09/25/2015   Seasonal allergies    Urine incontinence    Past Surgical History:  Procedure Laterality Date   COLONOSCOPY  2000   for IBS sxs, WNL per patient   COLONOSCOPY WITH PROPOFOL  N/A 05/18/2020   Procedure: COLONOSCOPY WITH PROPOFOL ;  Surgeon: Unk Corinn Skiff, MD;  Location: [facility] ENDOSCOPY;  Service: Gastroenterology;  Laterality: N/A;   DILATION AND CURETTAGE OF UTERUS  08/2007   WISDOM TOOTH EXTRACTION  1993   Patient Active Problem List   Diagnosis Date Noted   Perimenopause 05/25/2023   Gastroesophageal reflux disease 05/25/2023   Moderate mixed hyperlipidemia not requiring statin therapy 05/25/2023   Frequency of urination 11/21/2022   Bilateral tinnitus 10/31/2022   Irritable bowel syndrome with both constipation and diarrhea 07/04/2022   Vitamin B12 deficiency 07/04/2022   Racing heart beat 05/30/2022   Mixed stress and urge urinary incontinence 03/31/2022   Urinary incontinence 05/23/2019   Shortness of breath 09/20/2017   Hypertriglyceridemia 09/25/2015   Benign hematuria     PCP: Prentice HAS   REFERRING PROVIDER: Bernardo, DO  REFERRING DIAG: Coccydynia  prolapse and urinary incontinence  Rationale for Evaluation and Treatment Rehabilitation  THERAPY DIAG:  Sacrococcygeal disorders, not  elsewhere classified  Other abnormalities of gait and mobility   ONSET DATE:   SUBJECTIVE:      SUBJECTIVE STATEMENT  TODAY:   Pt reports she hiked [employer] and ate lunch on a rock and experienced 50% less tailbone pain.     SUBJECTIVE STATEMENT ON EVAL 10/16/23 :   1)   Urinary leakage:  pt does not wear pads,  occurs with having to go to the bathroom or when laughing or sometimes with walking , hiking downhill    2) LBP occurs at L/S location 1/10 and does back PT HEP. Pt notices standing for 30 min and the pain starts and 3/10  Eases with walking again    3) prolapse occurs when she is constipated  Stool consistency 4 is about 10-15% of the time, Type 3 across 85-90% of the time.   PERTINENT HISTORY:   Pt tried Pelvic PT in the past last Dec 2024 for 2 visits and she kept doing her exercises. They really helped her prolapse, LBP, incontinence.    Pt also started using estrogen cream which also helped a lot.   Pt went to [place]  recently  and rode the amusement park rides and only had one episode of leakage compared to more episodes wen she went last year. Pt hardly leaks when she laughs. Pt is so happy that has improved .   Hx of Vaginal delivery with perineal tears.   Pt enjoys hiking  3-10 miles per trip   PAIN:  Are you having pain? Yes: See above   PRECAUTIONS: No   WEIGHT BEARING RESTRICTIONS: No   FALLS:  Has patient fallen in last 6 months?  No   LIVING ENVIRONMENT: Lives with: husband, dtr, dog  Lives in: two story  Stairs:  2 STE with rail   OCCUPATION: stay home mom   PLOF: IND   PATIENT GOALS:  Learn how to get better to have no pain in the low back pain, tailbone, and for prolapse to go away and up    OBJECTIVE:     [facility] PT Assessment - 11/23/23 0912       Squat   Comments WBing on heel, L knee adducted, hyperextended knees'      Palpation   Palpation comment hypomobile at T10-12, deviated L3 to R,          [facility] Adult PT  Treatment/Exercise - 11/23/23 0928       Therapeutic Activites    Therapeutic Activities Other Therapeutic Activities    Other Therapeutic Activities donning shoes with position promoting less rounded shoulders      Neuro Re-ed    Neuro Re-ed Details  excessive cues for tranverse arch co-activation and less hyperextended knees with squats , cued for thoracic ext, scapular  retraction stabilization in little cricket HEP, cued for propioception for alignment and trunk rotation from quadriped      Manual Therapy   Manual therapy comments PA mob grade III, medial glide, STM/MWM at problem areas noted in assessment to promote medial alignment and thoracic extension/ scapular retraction/ stabilization              HOME EXERCISE PROGRAM: See pt instruction section    ASSESSMENT:  CLINICAL IMPRESSION:  Improvements include:   Visit 2 :  Pt reports her tailbone is better on soft surfaces since last session  but not on hard surfaces.      Pt is practicing not crossing legs.       Vist 3 :  Pt reports her LBP resolved last week and she was able to stand talking to her neighbor for > 30 min and it did not hurt   Visit 4:   LBP continues to be resolved across 3 weeks .  Visit 4:  Pt reports tailbone seems better and she was able to kayak 2.5 hours  with minimal tailbone pain    Visit 5:  Pt reports less tailbone pain with soft chairs  Visit 6: Pt reports she hiked [employer] and ate lunch on a rock and experienced 50% less tailbone pain.    Continued to require manual Tx to realign spine and improve mobility of spine to promote more thoracic extension and scapular retraction and stabilization to help minimize rounded shoulders/ thoracic kyphosis.   Added new HEP to promote medial alignment of spine. Cued for technique and propioception .   Cued for proper squat for less adducted knees and more transver arch coactivation.     Anticipate these improvements will continue to  promote optimize IAP system for improved pelvic floor function, trunk stability, gait, balance, stabilization with mobility tasks.  Plan to address pelvic floor issues once pelvis and spine are realigned to yield better outcomes.  Pt benefits from skilled PT.    OBJECTIVE IMPAIRMENTS decreased activity tolerance, decreased coordination, decreased endurance, decreased mobility, difficulty walking, decreased ROM, decreased strength, decreased safety awareness, hypomobility, increased muscle spasms, impaired flexibility, improper body mechanics, postural dysfunction, and pain. scar restrictions   ACTIVITY LIMITATIONS  self-care, sitting, hiking   PARTICIPATION LIMITATIONS:  community   PERSONAL FACTORS   affecting patient's functional outcome:    REHAB POTENTIAL: Good   CLINICAL DECISION MAKING: Evolving/moderate complexity   EVALUATION COMPLEXITY: Moderate    PATIENT EDUCATION:    Education details: Showed pt anatomy images. Explained muscles attachments/ connection, physiology of deep core system/ spinal- thoracic-pelvis-lower kinetic chain as they relate to pt's presentation, Sx, and past Hx. Explained what and how these areas of deficits need to be restored to balance and function    See Therapeutic activity / neuromuscular re-education section  Answered pt's questions.   Person educated: Patient Education method: Explanation, Demonstration, Tactile cues, Verbal cues, and Handouts Education comprehension: verbalized understanding, returned demonstration, verbal cues required, tactile cues required, and needs further education     PLAN: PT FREQUENCY: 1x/week   PT DURATION: 10 weeks   PLANNED INTERVENTIONS:   Gait training;Stair training;Functional mobility training;DME Instruction;Therapeutic activities;Therapeutic exercise;Balance training;Neuromuscular re-education;Patient/family education;Vestibular;Visual/perceptual  remediation/compensation;Passive range of motion;Moist Heat;Cryotherapy;Traction;Canalith Repostioning;Joint Manipulations;Manual lymph drainage;Manual techniques;Scar mobilization;Energy conservation;Dry needling;ADLs/Self Care Home Management;Biofeedback;Electrical Stimulation;Taping    PLAN FOR NEXT SESSION: See clinical impression for plan     GOALS: Goals reviewed with patient? Yes  SHORT TERM GOALS: Target date: 11/13/2023    Pt will demo IND with HEP                    Baseline: Not IND            Goal status: INITIAL  LONG TERM GOALS: Target date:12/25/2023    1.Pt will demo proper deep core coordination without chest breathing and optimal excursion of diaphragm/pelvic floor  and demo proper movement of pelvic floor for upward position of pelvic organs in order to promote spinal stability and pelvic floor function  Baseline: dyscoordination Goal status: INITIAL  2.  Pt will demo proper body mechanics in against gravity tasks and ADLs  work tasks, fitness  to minimize straining pelvic floor / back    Baseline: not IND, improper form that places strain on pelvic floor  Goal status: INITIAL    3. Pt will demo increased gait speed > 1.4 m/s with reciprocal gait pattern, longer stride length  in order to ambulate safely in community and return to fitness routine  Baseline:1.28 m/s,  Pelvic sway to R, limited R thoracic posterior rotaion  Goal status: INITIAL    4. Pt will demo levelled pelvic girdle and shoulder height in order to progress to deep core strengthening HEP and restore mobility at spine, pelvis, gait, posture minimize falls, and return to hiking  Baseline: R shoulder /  iliac crest lowered  Goal status: INITIAL   5. Pt will improve PFDI-7 questionnaire to  pts  score change  to demo improved QOL  Baseline:    ( greater pts indicate greater negative impact on QOL)    38 pts  ( total)  33  pts  ( UIQ-7 )   0 pts  ( CRAIQ-7 )  5   pts  ( POPIQ-7  )  Baseline: plan to adminster  Goal status: INITIAL  6.  Pt will report: Stool type 4   > 50% of the time   Baseline:  Type 4 across 10-15 %   of the time, Type 3 - 85-90% of the time Goal status: INITIAL  7. Pt will report able to stand 1 hour and experience no LBP  in order to attend community events  Baseline:standing for 30 min and the pain starts and 3/10  Goal status: INITIAL       Pia Lupe Plump, PT 11/23/2023, 9:11 AM

## 2023-11-23 NOTE — Patient Instructions (Addendum)
 Minisquat: Scoot buttocks back slight, hinge like you are looking at your reflection on a pond  Knees behind toes,  Inhale to smell flowers  Exhale on the rise like rocket  Do not lock knees, have more weight across ballmounds of feet, toes relaxed and spread them, not grip them   10 reps x 3 x day    __   Karolynn pose    Table top , toes tucked under, hand under shoulder , other hand on hip and rotation to ceiling  10 reps    ___    Little cricket   On belly, palms under armpits, elbows pointed to ceiling  Inhale: lengthen crown of the head away from shoulders Exhale, feel belly hug in and press palms into the floor, squeezing elbows/shoulder blades towards each other while chest lifts about 5 cm off of the floor. KEEP CHIN TUCKED LIKE YOU ARE HOLDING AN APPLE UNDER CHIN, GAZE AT THE FLOOR OR BED. You should feel the hinging movement at mid back and not the low back.   3 sec hold, x 10 reps

## 2023-11-25 LAB — COMPREHENSIVE METABOLIC PANEL WITH GFR
AG Ratio: 1.9 (calc) (ref 1.0–2.5)
ALT: 16 U/L (ref 6–29)
AST: 16 U/L (ref 10–35)
Albumin: 4.5 g/dL (ref 3.6–5.1)
Alkaline phosphatase (APISO): 91 U/L (ref 37–153)
BUN: 19 mg/dL (ref 7–25)
CO2: 30 mmol/L (ref 20–32)
Calcium: 9.4 mg/dL (ref 8.6–10.4)
Chloride: 105 mmol/L (ref 98–110)
Creat: 0.6 mg/dL (ref 0.50–1.03)
Globulin: 2.4 g/dL (ref 1.9–3.7)
Glucose, Bld: 89 mg/dL (ref 65–99)
Potassium: 3.8 mmol/L (ref 3.5–5.3)
Sodium: 141 mmol/L (ref 135–146)
Total Bilirubin: 0.7 mg/dL (ref 0.2–1.2)
Total Protein: 6.9 g/dL (ref 6.1–8.1)
eGFR: 107 mL/min/1.73m2 (ref >=60)

## 2023-11-25 LAB — CBC WITH DIFFERENTIAL/PLATELET
Absolute Lymphocytes: 1673 {cells}/uL (ref 850–3900)
Absolute Monocytes: 357 {cells}/uL (ref 200–950)
Basophils Absolute: 31 {cells}/uL (ref 0–200)
Basophils Relative: 0.6 %
Eosinophils Absolute: 92 {cells}/uL (ref 15–500)
Eosinophils Relative: 1.8 %
HCT: 37.5 % (ref 35.0–45.0)
Hemoglobin: 12.5 g/dL (ref 11.7–15.5)
MCH: 32.2 pg (ref 27.0–33.0)
MCHC: 33.3 g/dL (ref 32.0–36.0)
MCV: 96.6 fL (ref 80.0–100.0)
MPV: 10.9 fL (ref 7.5–12.5)
Monocytes Relative: 7 %
Neutro Abs: 2948 {cells}/uL (ref 1500–7800)
Neutrophils Relative %: 57.8 %
Platelets: 256 Thousand/uL (ref 140–400)
RBC: 3.88 Million/uL (ref 3.80–5.10)
RDW: 12.3 % (ref 11.0–15.0)
Total Lymphocyte: 32.8 %
WBC: 5.1 Thousand/uL (ref 3.8–10.8)

## 2023-11-25 LAB — LIPID PANEL
Cholesterol: 198 mg/dL (ref <200)
HDL: 77 mg/dL (ref >=50)
LDL Cholesterol (Calc): 100 mg/dL — ABNORMAL HIGH
Non-HDL Cholesterol (Calc): 121 mg/dL (ref <130)
Total CHOL/HDL Ratio: 2.6 (calc) (ref <5.0)
Triglycerides: 111 mg/dL (ref <150)

## 2023-11-25 LAB — VITAMIN B12: Vitamin B-12: 382 pg/mL (ref 200–1100)

## 2023-11-25 LAB — VITAMIN D 25 HYDROXY (VIT D DEFICIENCY, FRACTURES): Vit D, 25-Hydroxy: 29 ng/mL — ABNORMAL LOW (ref 30–100)

## 2023-11-27 ENCOUNTER — Ambulatory Visit (INDEPENDENT_AMBULATORY_CARE_PROVIDER_SITE_OTHER): Admitting: Internal Medicine

## 2023-11-27 ENCOUNTER — Encounter: Payer: Self-pay | Admitting: Internal Medicine

## 2023-11-27 ENCOUNTER — Other Ambulatory Visit: Payer: Self-pay

## 2023-11-27 VITALS — BP 120/70 | HR 75 | Temp 97.9°F | Resp 16 | Ht 66.0 in | Wt 177.4 lb

## 2023-11-27 DIAGNOSIS — E559 Vitamin D deficiency, unspecified: Secondary | ICD-10-CM

## 2023-11-27 DIAGNOSIS — Z0000 Encounter for general adult medical examination without abnormal findings: Secondary | ICD-10-CM | POA: Diagnosis not present

## 2023-11-27 MED ORDER — VITAMIN D (ERGOCALCIFEROL) 1.25 MG (50000 UNIT) PO CAPS: 50000.0000 [IU] | ORAL_CAPSULE | ORAL | 0 refills | Status: AC

## 2023-11-27 NOTE — Patient Instructions (Addendum)
Pneumococcal Conjugate Vaccine (PCV20) Injection What is this medication? PNEUMOCOCCAL CONJUGATE VACCINE (NEU mo KOK al kon ju gate vak SEEN) reduces the risk of pneumococcal disease, such as pneumonia. It does not treat pneumococcal disease. It is still possible to get pneumococcal disease after receiving this vaccine, but the symptoms may be less severe or not last as long. It works by helping your immune system learn how to fight off a future infection. This medicine may be used for other purposes; ask your health care provider or pharmacist if you have questions. COMMON BRAND NAME(S): Prevnar 20 What should I tell my care team before I take this medication? They need to know if you have any of these conditions: Bleeding disorder Fever Immune system problems An unusual or allergic reaction to pneumococcal vaccine, diphtheria toxoid, other vaccines, other medications, foods, dyes, or preservatives Pregnant or trying to get pregnant Breastfeeding How should I use this medication? This vaccine is injected into a muscle. It is given by your care team. A copy of Vaccine Information Statements will be given before each vaccination. Be sure to read this information carefully each time. This sheet may change often. Talk to your care team about the use of this medication in children. While it may be given to children as young as 6 weeks for selected conditions, precautions do apply. Overdosage: If you think you have taken too much of this medicine contact a poison control center or emergency room at once. NOTE: This medicine is only for you. Do not share this medicine with others. What if I miss a dose? This does not apply. This medication is not for regular use. What may interact with this medication? Medications for cancer chemotherapy Medications that suppress your immune function Steroid medications, such as prednisone or cortisone This list may not describe all possible interactions. Give  your health care provider a list of all the medicines, herbs, non-prescription drugs, or dietary supplements you use. Also tell them if you smoke, drink alcohol, or use illegal drugs. Some items may interact with your medicine. What should I watch for while using this medication? Visit your care team regularly. Report any side effects to your care team right away. This vaccine, like all vaccines, may not fully protect everyone. What side effects may I notice from receiving this medication? Side effects that you should report to your care team as soon as possible: Allergic reactions--skin rash, itching, hives, swelling of the face, lips, tongue, or throat Side effects that usually do not require medical attention (report these to your care team if they continue or are bothersome): Fatigue Fever Headache Joint pain Muscle pain Pain, redness, or irritation at injection site This list may not describe all possible side effects. Call your doctor for medical advice about side effects. You may report side effects to FDA at 1-800-FDA-1088. Where should I keep my medication? This vaccine is only given by your care team. It will not be stored at home. NOTE: This sheet is a summary. It may not cover all possible information. If you have questions about this medicine, talk to your doctor, pharmacist, or health care provider.  2024 Elsevier/Gold Standard (2021-08-04 00:00:00)

## 2023-11-27 NOTE — Progress Notes (Signed)
 Name: Tiffany Zamora   MRN: [id number]    DOB: 1970-07-31   Date:11/27/2023       Progress Note  Subjective  Chief Complaint  Chief Complaint  Patient presents with   Annual Exam    HPI  Patient presents for annual CPE.  Discussed the use of AI scribe software for clinical note transcription with the patient, who gave verbal consent to proceed.  History of Present Illness Tiffany Zamora is a 53 year old female who presents for an annual physical exam.  She has hyperlipidemia with recent cholesterol levels showing total cholesterol under 200, HDL at 77, and LDL slightly above 100. She maintains good HDL levels through regular exercise and a diet focused on lean proteins with limited red meat.  She experiences tailbone pain, previously managed with Celebrex , now relieved with Aleve and pelvic floor physical therapy. A cortisone shot provided temporary relief.  She uses estrogen cream for pelvic floor issues, applying it every other day, which has improved pelvic floor strength.  Her vitamin D  level was slightly low at 29, and she takes a high-dose supplement weekly. She occasionally takes vitamin B12 supplements, with current levels stable.  She has a history of shortness of breath attributed to stress and a stable pulmonary nodule found during previous testing.   Diet: Regular Exercise: 3 days 90 minutes  Last Eye Exam: completed Last Dental Exam: completed  Flowsheet Row Office Visit from 11/27/2023 in [facility]  AUDIT-C Score 0   Depression: Phq 9 is  negative    11/27/2023    2:20 PM 07/19/2023    1:01 PM 05/25/2023   10:18 AM 11/21/2022    4:08 PM 10/26/2022   11:01 AM  Depression screen PHQ 2/9  Decreased Interest 0 0 0 0 0  Down, Depressed, Hopeless 0 0 0 0 0  PHQ - 2 Score 0 0 0 0 0  Altered sleeping    1   Tired, decreased energy    0   Change in appetite    0   Feeling bad or failure about yourself     0   Trouble  concentrating    0   Moving slowly or fidgety/restless    0   Suicidal thoughts    0   PHQ-9 Score    1   Difficult doing work/chores    Not difficult at all    Hypertension: BP Readings from Last 3 Encounters:  11/27/23 120/70  07/19/23 136/84  05/25/23 118/82   Obesity: Wt Readings from Last 3 Encounters:  11/27/23 177 lb 6.4 oz (80.5 kg)  07/19/23 178 lb 4.8 oz (80.9 kg)  05/25/23 177 lb 4.8 oz (80.4 kg)   BMI Readings from Last 3 Encounters:  11/27/23 28.63 kg/m  07/19/23 28.78 kg/m  05/25/23 28.62 kg/m     Vaccines: reviewed with the patient. Prevnar 20 due, will print info for patient to review.   Hep C Screening: discussed getting next year STD testing and prevention (HIV/chl/gon/syphilis): no concerns  Intimate partner violence: negative screen  Sexual History :active Discussed importance of follow up if any post-menopausal bleeding: not applicable - yes Incontinence Symptoms: negative for symptoms   Breast cancer:  - Last Mammogram: 11/03/2022 - ordered for November   Osteoporosis Prevention : Discussed high calcium and vitamin D  supplementation, weight bearing exercises Bone density :not applicable   Cervical cancer screening: encouraged to schedule with GYN  Skin cancer: Discussed monitoring for atypical lesions  Colorectal cancer: 05/18/2020 colonoscopy, repeat in 10 years  Lung cancer:  Low Dose CT Chest recommended if Age 68-80 years, 20 pack-year currently smoking OR have quit w/in 15years. Patient does not qualify for screen   ECG: 04/07/2023  Advanced Care Planning: A voluntary discussion about advance care planning including the explanation and discussion of advance directives.  Discussed health care proxy and Living will, and the patient was able to identify a health care proxy .  Patient does not have a living will and power of attorney of health care   Patient Active Problem List   Diagnosis Date Noted   Perimenopause 05/25/2023    Gastroesophageal reflux disease 05/25/2023   Moderate mixed hyperlipidemia not requiring statin therapy 05/25/2023   Frequency of urination 11/21/2022   Bilateral tinnitus 10/31/2022   Irritable bowel syndrome with both constipation and diarrhea 07/04/2022   Vitamin B12 deficiency 07/04/2022   Racing heart beat 05/30/2022   Mixed stress and urge urinary incontinence 03/31/2022   Urinary incontinence 05/23/2019   Shortness of breath 09/20/2017   Hypertriglyceridemia 09/25/2015   Benign hematuria     Past Surgical History:  Procedure Laterality Date   COLONOSCOPY  2000   for IBS sxs, WNL per patient   COLONOSCOPY WITH PROPOFOL  N/A 05/18/2020   Procedure: COLONOSCOPY WITH PROPOFOL ;  Surgeon: Unk Corinn Skiff, MD;  Location: [facility] ENDOSCOPY;  Service: Gastroenterology;  Laterality: N/A;   DILATION AND CURETTAGE OF UTERUS  08/2007   WISDOM TOOTH EXTRACTION  1993    Family History  Problem Relation Age of Onset   Diabetes Sister    Heart attack Maternal Grandfather 38       MI   Hypertension Mother    Tongue cancer Mother        prior smoker   Hypertension Father    Dementia Maternal Grandmother 95       died at 34   Cancer Neg Hx    Stroke Neg Hx     Social History   Socioeconomic History   Marital status: Married    Spouse name: Tiffany Zamora   Number of children: 1   Years of education: Masters   Highest education level: Not on file  Occupational History   Occupation: stay at home  Tobacco Use   Smoking status: Never    Passive exposure: Never   Smokeless tobacco: Never  Vaping Use   Vaping status: Never Used  Substance and Sexual Activity   Alcohol use: Yes    Alcohol/week: 0.0 standard drinks of alcohol    Comment: a few times a week, 2 glasses   Drug use: No   Sexual activity: Yes    Partners: Male    Birth control/protection: Pill  Other Topics Concern   Not on file  Social History Narrative   Wife of Tiffany Zamora and daughter and dog   Occupation:  stay at home mom   Edu: master's in criminology      05/23/19   From: [place] , moved because her husband is from here   Living: with Husband, Tiffany Zamora and daughter and dog   Work: former Emergency planning/management officer, 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      Family: daughter Tiffany Zamora (2007), in-laws nearby with a good relationship      Enjoys: hiking, going to the gym, biking, spend time with friends, photography things      Activity: exercises regularly 3-5x/wk   Diet: not so good, trying to change that  Safety   Seat belts: Yes    Guns: Yes  and secure   Safe in relationships: Yes    Social Drivers of Health   Financial Resource Strain: Low Risk  (08/02/2023)   Received from [facility]   Overall Financial Resource Strain (CARDIA)    Difficulty of Paying Living Expenses: Not hard at all  Food Insecurity: No Food Insecurity (11/27/2023)   Hunger Vital Sign    Worried About Running Out of Food in the Last Year: Never true    Ran Out of Food in the Last Year: Never true  Transportation Needs: No Transportation Needs (11/27/2023)   PRAPARE - Administrator, Civil Service (Medical): No    Lack of Transportation (Non-Medical): No  Physical Activity: Not on file  Stress: Not on file  Social Connections: Moderately Integrated (11/27/2023)   Social Connection and Isolation Panel    Frequency of Communication with Friends and Family: More than three times a week    Frequency of Social Gatherings with Friends and Family: More than three times a week    Attends Religious Services: More than 4 times per year    Active Member of [employer] or Organizations: No    Attends Banker Meetings: Never    Marital Status: Married  Catering manager Violence: Not At Risk (11/27/2023)   Humiliation, Afraid, Rape, and Kick questionnaire    Fear of Current or Ex-Partner: No    Emotionally Abused: No    Physically Abused: No    Sexually Abused: No     Current Outpatient Medications:     estradiol (ESTRACE) 0.1 MG/GM vaginal cream, Insert 0.5 applicatorsful twice a week by vaginal route for 84 days., Disp: , Rfl:    melatonin 3 MG TABS tablet, Take 3 mg by mouth at bedtime., Disp: , Rfl:    celecoxib  (CELEBREX ) 100 MG capsule, Take 1 capsule (100 mg total) by mouth 2 (two) times daily. (Patient not taking: Reported on 11/27/2023), Disp: 60 capsule, Rfl: 0   omeprazole  (PRILOSEC) 20 MG capsule, TAKE 1 CAPSULE BY MOUTH EVERY DAY (Patient not taking: Reported on 10/16/2023), Disp: 90 capsule, Rfl: 1  No Known Allergies   Review of Systems  All other systems reviewed and are negative.   Objective  Vitals:   11/27/23 1419  BP: 120/70  Pulse: 75  Resp: 16  Temp: 97.9 F (36.6 C)  TempSrc: Oral  SpO2: 97%  Weight: 177 lb 6.4 oz (80.5 kg)  Height: 5' 6 (1.676 m)    Body mass index is 28.63 kg/m.  Physical Exam Constitutional:      Appearance: Normal appearance.  HENT:     Head: Normocephalic and atraumatic.  Eyes:     Conjunctiva/sclera: Conjunctivae normal.  Neck:     Comments: No thyromegaly Cardiovascular:     Rate and Rhythm: Normal rate and regular rhythm.  Pulmonary:     Effort: Pulmonary effort is normal.     Breath sounds: Normal breath sounds.  Musculoskeletal:     Cervical back: No tenderness.     Right lower leg: No edema.     Left lower leg: No edema.  Lymphadenopathy:     Cervical: No cervical adenopathy.  Skin:    General: Skin is warm and dry.  Neurological:     General: No focal deficit present.     Mental Status: She is alert. Mental status is at baseline.  Psychiatric:  Mood and Affect: Mood normal.        Behavior: Behavior normal.     Last CBC Lab Results  Component Value Date   WBC 5.1 11/22/2023   HGB 12.5 11/22/2023   HCT 37.5 11/22/2023   MCV 96.6 11/22/2023   MCH 32.2 11/22/2023   RDW 12.3 11/22/2023   PLT 256 11/22/2023   Last metabolic panel Lab Results  Component Value Date   GLUCOSE 89 11/22/2023    NA 141 11/22/2023   K 3.8 11/22/2023   CL 105 11/22/2023   CO2 30 11/22/2023   BUN 19 11/22/2023   CREATININE 0.60 11/22/2023   EGFR 107 11/22/2023   CALCIUM 9.4 11/22/2023   PROT 6.9 11/22/2023   ALBUMIN 4.3 10/15/2021   BILITOT 0.7 11/22/2023   ALKPHOS 75 10/15/2021   AST 16 11/22/2023   ALT 16 11/22/2023   ANIONGAP 6 02/03/2022   Last lipids Lab Results  Component Value Date   CHOL 198 11/22/2023   HDL 77 11/22/2023   LDLCALC 100 (H) 11/22/2023   LDLDIRECT 93.0 09/21/2015   TRIG 111 11/22/2023   CHOLHDL 2.6 11/22/2023   Last hemoglobin A1c No results found for: HGBA1C Last thyroid  functions Lab Results  Component Value Date   TSH 0.88 05/30/2022   Last vitamin D  Lab Results  Component Value Date   VD25OH 29 (L) 11/22/2023   Last vitamin B12 and Folate Lab Results  Component Value Date   VITAMINB12 382 11/22/2023   FOLATE 11.6 10/26/2022      Assessment & Plan Assessment & Plan Adult Wellness Visit Routine wellness visit with stable vitals and updated screenings. Vaccination status reviewed. - Print information on pneumonia vaccine for review. - Consider pneumonia vaccine at next visit or pharmacy. - Consider flu vaccine at next visit or pharmacy.  Menopausal symptoms (vaginal atrophy) Managed with estrogen cream. Correct dosing resolved bleeding. Aids pelvic floor strengthening and vaginal atrophy. - Continue estrogen cream as prescribed (3 times a week).  Hyperlipidemia, mild, not requiring medication Mild hyperlipidemia with LDL at 100 mg/dL. Excellent HDL and low 10-year cardiovascular risk. Dietary modifications discussed. - Monitor cholesterol levels annually. - Encourage dietary modifications to reduce LDL.  Vitamin D  deficiency Slightly low vitamin D  level at 29 ng/mL. Plan to boost levels with supplementation. - Prescribe high-dose vitamin D  (50,000 IU) once weekly for 12 weeks. - Switch to over-the-counter vitamin D  after 12 weeks.  -  Vitamin D , Ergocalciferol , (DRISDOL ) 1.25 MG (50000 UNIT) CAPS capsule; Take 1 capsule (50,000 Units total) by mouth every 7 (seven) days.  Dispense: 12 capsule; Refill: 0   -USPSTF grade A and B recommendations reviewed with patient; age-appropriate recommendations, preventive care, screening tests, etc discussed and encouraged; healthy living encouraged; see AVS for patient education given to patient -Discussed importance of 150 minutes of physical activity weekly, eat two servings of fish weekly, eat one serving of tree nuts ( cashews, pistachios, pecans, almonds.SABRA) every other day, eat 6 servings of fruit/vegetables daily and drink plenty of water and avoid sweet beverages.   -Reviewed Health Maintenance: Yes.

## 2023-11-28 ENCOUNTER — Encounter: Admitting: Physical Therapy

## 2023-11-30 ENCOUNTER — Ambulatory Visit: Admitting: Physical Therapy

## 2023-11-30 DIAGNOSIS — M62838 Other muscle spasm: Secondary | ICD-10-CM

## 2023-11-30 DIAGNOSIS — M533 Sacrococcygeal disorders, not elsewhere classified: Secondary | ICD-10-CM

## 2023-11-30 DIAGNOSIS — R2689 Other abnormalities of gait and mobility: Secondary | ICD-10-CM | POA: Diagnosis not present

## 2023-11-30 DIAGNOSIS — R278 Other lack of coordination: Secondary | ICD-10-CM | POA: Diagnosis not present

## 2023-11-30 DIAGNOSIS — M6281 Muscle weakness (generalized): Secondary | ICD-10-CM

## 2023-11-30 NOTE — Patient Instructions (Signed)
 Place band in U    band under ballmounds  while laying on back w/ knees bent   Lying on back, knees bent    band under ballmounds  while laying on back w/ knees bent  W exercise  30  Band is placed under feet, knees bent, feet are hip width apart Hold band with thumbs point out, keep upper arm and elbow touching the bed the whole time  - inhale and then exhale pull bands by bending elbows hands move in a w  (feel shoulder blades squeezing)   ________________

## 2023-11-30 NOTE — Therapy (Signed)
 OUTPATIENT PHYSICAL THERAPY TREATMENT    Patient Name: Tiffany Zamora MRN: [id number] DOB:16-May-1970, 53 y.o., female Today's Date: 11/30/2023   PT End of Session - 11/30/23 0855     Visit Number 7    Number of Visits 10    Date for Recertification  12/25/23    Authorization Type 20 cal yr,    PT Start Time 0850    PT Stop Time 0930    PT Time Calculation (min) 40 min    Activity Tolerance Patient tolerated treatment well    Behavior During Therapy [facility] for tasks assessed/performed          Past Medical History:  Diagnosis Date   Benign hematuria ~2005   per pt normal workup with cystoscopy ([place] )   Heartburn    IBS (irritable bowel syndrome) 09/25/2015   Seasonal allergies    Urine incontinence    Past Surgical History:  Procedure Laterality Date   COLONOSCOPY  2000   for IBS sxs, WNL per patient   COLONOSCOPY WITH PROPOFOL  N/A 05/18/2020   Procedure: COLONOSCOPY WITH PROPOFOL ;  Surgeon: Unk Corinn Skiff, MD;  Location: [facility] ENDOSCOPY;  Service: Gastroenterology;  Laterality: N/A;   DILATION AND CURETTAGE OF UTERUS  08/2007   WISDOM TOOTH EXTRACTION  1993   Patient Active Problem List   Diagnosis Date Noted   Perimenopause 05/25/2023   Gastroesophageal reflux disease 05/25/2023   Moderate mixed hyperlipidemia not requiring statin therapy 05/25/2023   Frequency of urination 11/21/2022   Bilateral tinnitus 10/31/2022   Irritable bowel syndrome with both constipation and diarrhea 07/04/2022   Vitamin B12 deficiency 07/04/2022   Racing heart beat 05/30/2022   Mixed stress and urge urinary incontinence 03/31/2022   Urinary incontinence 05/23/2019   Shortness of breath 09/20/2017   Hypertriglyceridemia 09/25/2015   Benign hematuria     PCP: Prentice HAS   REFERRING PROVIDER: Bernardo, DO  REFERRING DIAG: Coccydynia  prolapse and urinary incontinence  Rationale for Evaluation and Treatment Rehabilitation  THERAPY DIAG:  Sacrococcygeal disorders, not  elsewhere classified  Other abnormalities of gait and mobility   ONSET DATE:   SUBJECTIVE:      SUBJECTIVE STATEMENT  TODAY:   Pt reports  she has been able to sit on a hard rock on her hiking trip for 10-15 min  and her tailbone did not hurt  Pt has been able to stand for 2 hours without LBP until the end and it was at a lower level of pain Still better than before'   Pt has not noticed her prolapse a little but not much at all.  Pt has had Stool 4 consistency 100% of the time instead of 10-15% of the time. Pt is eating more fruit and fiber.   Pt fell while hiking and her low back got better after 2 days .  SUBJECTIVE STATEMENT ON EVAL 10/16/23 :   1)   Urinary leakage:  pt does not wear pads,  occurs with having to go to the bathroom or when laughing or sometimes with walking , hiking downhill    2) LBP occurs at L/S location 1/10 and does back PT HEP. Pt notices standing for 30 min and the pain starts and 3/10  Eases with walking again    3) prolapse occurs when she is constipated  Stool consistency 4 is about 10-15% of the time, Type 3 across 85-90% of the time.   PERTINENT HISTORY:   Pt tried Pelvic PT in the past last Dec 2024  for 2 visits and she kept doing her exercises. They really helped her prolapse, LBP, incontinence.    Pt also started using estrogen cream which also helped a lot.   Pt went to [place]  recently  and rode the amusement park rides and only had one episode of leakage compared to more episodes wen she went last year. Pt hardly leaks when she laughs. Pt is so happy that has improved .   Hx of Vaginal delivery with perineal tears.   Pt enjoys hiking 3-10 miles per trip   PAIN:  Are you having pain? Yes: See above   PRECAUTIONS: No   WEIGHT BEARING RESTRICTIONS: No   FALLS:  Has patient fallen in last 6 months?  No   LIVING ENVIRONMENT: Lives with: husband, dtr, dog  Lives in: two story  Stairs:  2 STE with rail   OCCUPATION: stay home mom    PLOF: IND   PATIENT GOALS:  Learn how to get better to have no pain in the low back pain, tailbone, and for prolapse to go away and up    OBJECTIVE:     [facility] PT Assessment - 11/30/23 0921       Palpation   Palpation comment hypomobile C4-7, T7-10 R deviated tightness interspinals        .  [facility] Adult PT Treatment/Exercise - 11/30/23 0922       Self-Care   Other Self-Care Comments  reassessed goals, discussed future sessions with pelvic floor assessment  based on pt's comfort level and consent for internal / external, pt consented internal assessment      Neuro Re-ed    Neuro Re-ed Details  excessive cues for stabilization, cervicoscapular retraction in new resistance band exericses,      Manual Therapy   Manual therapy comments PA mob Grade III, STM/MWM at areas noted in assessment to promote alignment and  minimize forward head posture             HOME EXERCISE PROGRAM: See pt instruction section    ASSESSMENT:  CLINICAL IMPRESSION:  Pt has met      Pt reports  she has been able to sit on a hard rock on her hiking trip for 10-15 min  and her tailbone did not hurt  Pt has been able to stand for 2 hours without LBP until the end and it was at a lower level of pain Still better than before'   Pt has not noticed her prolapse a little but not much at all.  Pt has had Stool 4 consistency 100% of the time instead of 10-15% of the time. Pt is eating more fruit and fiber.     Improvements include:   Visit 2 :  Pt reports her tailbone is better on soft surfaces since last session  but not on hard surfaces.      Pt is practicing not crossing legs.       Vist 3 :  Pt reports her LBP resolved last week and she was able to stand talking to her neighbor for > 30 min and it did not hurt   Visit 4:   LBP continues to be resolved across 3 weeks .  Visit 4:  Pt reports tailbone seems better and she was able to kayak 2.5 hours  with minimal tailbone pain    Visit 5:   Pt reports less tailbone pain with soft chairs  Visit 6: Pt reports she hiked [employer] and ate lunch on  a rock and experienced 50% less tailbone pain.  Visit 7: Pt reports she hiked [facility] and ate lunch on a rock and experienced no  tailbone pain.    Continued to require manual Tx to realign cervical spine to improve upright posture. Modified technique to accommodate for comfort.  Progressed  resistance band HEP with excessive cues for stabilization, cervicoscapular retraction in new resistance band exercises.   Reassessed goals, discussed future sessions with pelvic floor assessment  based on pt's comfort level and consent for internal / external, pt consented internal assessment    Anticipate these improvements will continue to promote optimize IAP system for improved pelvic floor function, trunk stability, gait, balance, stabilization with mobility tasks.  Plan to address pelvic floor issues once pelvis and spine are realigned to yield better outcomes.                                                       Pt benefits from skilled PT.    OBJECTIVE IMPAIRMENTS decreased activity tolerance, decreased coordination, decreased endurance, decreased mobility, difficulty walking, decreased ROM, decreased strength, decreased safety awareness, hypomobility, increased muscle spasms, impaired flexibility, improper body mechanics, postural dysfunction, and pain. scar restrictions   ACTIVITY LIMITATIONS  self-care, sitting, hiking   PARTICIPATION LIMITATIONS:  community   PERSONAL FACTORS   affecting patient's functional outcome:    REHAB POTENTIAL: Good   CLINICAL DECISION MAKING: Evolving/moderate complexity   EVALUATION COMPLEXITY: Moderate    PATIENT EDUCATION:    Education details: Showed pt anatomy images. Explained muscles attachments/ connection, physiology of deep core system/ spinal- thoracic-pelvis-lower kinetic chain as they relate to pt's presentation, Sx, and  past Hx. Explained what and how these areas of deficits need to be restored to balance and function    See Therapeutic activity / neuromuscular re-education section  Answered pt's questions.   Person educated: Patient Education method: Explanation, Demonstration, Tactile cues, Verbal cues, and Handouts Education comprehension: verbalized understanding, returned demonstration, verbal cues required, tactile cues required, and needs further education     PLAN: PT FREQUENCY: 1x/week   PT DURATION: 10 weeks   PLANNED INTERVENTIONS:   Gait training;Stair training;Functional mobility training;DME Instruction;Therapeutic activities;Therapeutic exercise;Balance training;Neuromuscular re-education;Patient/family education;Vestibular;Visual/perceptual remediation/compensation;Passive range of motion;Moist Heat;Cryotherapy;Traction;Canalith Repostioning;Joint Manipulations;Manual lymph drainage;Manual techniques;Scar mobilization;Energy conservation;Dry needling;ADLs/Self Care Home Management;Biofeedback;Electrical Stimulation;Taping    PLAN FOR NEXT SESSION: See clinical impression for plan     GOALS: Goals reviewed with patient? Yes  SHORT TERM GOALS: Target date: 11/13/2023    Pt will demo IND with HEP                    Baseline: Not IND            Goal status: INITIAL  LONG TERM GOALS: Target date:12/25/2023    1.Pt will demo proper deep core coordination without chest breathing and optimal excursion of diaphragm/pelvic floor  and demo proper movement of pelvic floor for upward position of pelvic organs in order to promote spinal stability and pelvic floor function  Baseline: dyscoordination Goal status: MET   2.  Pt will demo proper body mechanics in against gravity tasks and ADLs  work tasks, fitness  to minimize straining pelvic floor / back    Baseline: not IND, improper form that places strain on pelvic floor  Goal status:  MET     3. Pt will demo increased gait speed >  1.4 m/s with reciprocal gait pattern, longer stride length  in order to ambulate safely in community and return to fitness routine  Baseline:1.28 m/s,  Pelvic sway to R, limited R thoracic posterior rotaion  Goal status: MET ( 11/30/23:  2 m/s )     4. Pt will demo levelled pelvic girdle and shoulder height in order to progress to deep core strengthening HEP and restore mobility at spine, pelvis, gait, posture minimize falls, and return to hiking  Baseline: R shoulder /  iliac crest lowered  Goal status: MET    5. Pt will improve PFDI-7 questionnaire to  pts  score change  to demo improved QOL  Baseline:    ( greater pts indicate greater negative impact on QOL)    38 pts  ( total)   ---> 16    33  pts  ( UIQ-7 )   --> 10   0 pts  ( CRAIQ-7 )  --> 0  5   pts  ( POPIQ-7 )  --> 0  Baseline: plan to adminster  Goal status: MET     6.  Pt will report: Stool type 4   > 50% of the time   Baseline:  Type 4 across 10-15 %   of the time, Type 3 - 85-90% of the time Goal status: MET  Stool Type 4 at 100% of the time   7. Pt will report able to stand 1 hour and experience no LBP  in order to attend community events  Baseline:standing for 30 min and the pain starts and 3/10  Goal status: MET ( standing 2 hour without LBP until the end and it was at a lower level of pain)         Pia Lupe Plump, PT 11/30/2023, 8:55 AM

## 2023-12-05 ENCOUNTER — Encounter: Admitting: Physical Therapy

## 2023-12-07 ENCOUNTER — Ambulatory Visit: Attending: Internal Medicine | Admitting: Physical Therapy

## 2023-12-07 DIAGNOSIS — R278 Other lack of coordination: Secondary | ICD-10-CM | POA: Insufficient documentation

## 2023-12-07 DIAGNOSIS — R2689 Other abnormalities of gait and mobility: Secondary | ICD-10-CM | POA: Insufficient documentation

## 2023-12-07 DIAGNOSIS — M6281 Muscle weakness (generalized): Secondary | ICD-10-CM | POA: Diagnosis not present

## 2023-12-07 DIAGNOSIS — M533 Sacrococcygeal disorders, not elsewhere classified: Secondary | ICD-10-CM | POA: Insufficient documentation

## 2023-12-07 DIAGNOSIS — M62838 Other muscle spasm: Secondary | ICD-10-CM | POA: Insufficient documentation

## 2023-12-07 NOTE — Patient Instructions (Signed)
 Strengthening feet arches:    Feet slides :   Points of contact at sitting bones  Four points of contact of foot,  Side knee back while keeping knee out along 2-3rd toe line   Heel up, ankle not twist out Lower heel while keeping knee out along 2-3rd toe line Four points of contact of foot, Slide foot back while keeping knee out along 2-3rd toe line   Repeated with other foot    3 min

## 2023-12-07 NOTE — Therapy (Addendum)
 OUTPATIENT PHYSICAL THERAPY TREATMENT    Patient Name: Tiffany Zamora MRN: [id number] DOB:[birth year], 53 y.o., female Today's Date: 12/07/2023   PT End of Session - 12/07/23 1022     Visit Number 8    Number of Visits 10    Date for Recertification  12/25/23    Authorization Type 20 cal yr,    PT Start Time 1017    PT Stop Time 1100    PT Time Calculation (min) 43 min    Activity Tolerance Patient tolerated treatment well    Behavior During Therapy [facility] for tasks assessed/performed          Past Medical History:  Diagnosis Date   Benign hematuria ~2005   per pt normal workup with cystoscopy ([place] )   Heartburn    IBS (irritable bowel syndrome) 09/25/2015   Seasonal allergies    Urine incontinence    Past Surgical History:  Procedure Laterality Date   COLONOSCOPY  2000   for IBS sxs, WNL per patient   COLONOSCOPY WITH PROPOFOL  N/A 05/18/2020   Procedure: COLONOSCOPY WITH PROPOFOL ;  Surgeon: Unk Corinn Skiff, MD;  Location: [facility] ENDOSCOPY;  Service: Gastroenterology;  Laterality: N/A;   DILATION AND CURETTAGE OF UTERUS  08/2007   WISDOM TOOTH EXTRACTION  1993   Patient Active Problem List   Diagnosis Date Noted   Perimenopause 05/25/2023   Gastroesophageal reflux disease 05/25/2023   Moderate mixed hyperlipidemia not requiring statin therapy 05/25/2023   Frequency of urination 11/21/2022   Bilateral tinnitus 10/31/2022   Irritable bowel syndrome with both constipation and diarrhea 07/04/2022   Vitamin B12 deficiency 07/04/2022   Racing heart beat 05/30/2022   Mixed stress and urge urinary incontinence 03/31/2022   Urinary incontinence 05/23/2019   Shortness of breath 09/20/2017   Hypertriglyceridemia 09/25/2015   Benign hematuria     PCP: Prentice HAS   REFERRING PROVIDER: Bernardo, DO  REFERRING DIAG: Coccydynia  prolapse and urinary incontinence  Rationale for Evaluation and Treatment Rehabilitation  THERAPY DIAG:  Sacrococcygeal disorders, not  elsewhere classified  Other abnormalities of gait and mobility   ONSET DATE:   SUBJECTIVE:      SUBJECTIVE STATEMENT  TODAY:   Pt reports tailbone and prolapse were more noticeable this week compared to weeks before. Pt took a hip hop step class and she noticed her prolapsed after that class   SUBJECTIVE STATEMENT ON EVAL 10/16/23 :   1)   Urinary leakage:  pt does not wear pads,  occurs with having to go to the bathroom or when laughing or sometimes with walking , hiking downhill    2) LBP occurs at L/S location 1/10 and does back PT HEP. Pt notices standing for 30 min and the pain starts and 3/10  Eases with walking again    3) prolapse occurs when she is constipated  Stool consistency 4 is about 10-15% of the time, Type 3 across 85-90% of the time.   PERTINENT HISTORY:   Pt tried Pelvic PT in the past last Dec 2024 for 2 visits and she kept doing her exercises. They really helped her prolapse, LBP, incontinence.    Pt also started using estrogen cream which also helped a lot.   Pt went to [place]  recently  and rode the amusement park rides and only had one episode of leakage compared to more episodes wen she went last year. Pt hardly leaks when she laughs. Pt is so happy that has improved .   Hx of Vaginal  delivery with perineal tears.   Pt enjoys hiking 3-10 miles per trip   PAIN:  Are you having pain? Yes: See above   PRECAUTIONS: No   WEIGHT BEARING RESTRICTIONS: No   FALLS:  Has patient fallen in last 6 months?  No   LIVING ENVIRONMENT: Lives with: husband, dtr, dog  Lives in: two story  Stairs:  2 STE with rail   OCCUPATION: stay home mom   PLOF: IND   PATIENT GOALS:  Learn how to get better to have no pain in the low back pain, tailbone, and for prolapse to go away and up    OBJECTIVE:    [facility] PT Assessment - 12/07/23 1636       Squat   Comments adducted knees      Sit to Stand   Comments adducted knees ,      Other:   Other/ Comments  simulated hip hop stepping class , HOKA shoes with adducted knees and supination of feet,, poor eccentric control of plantar fascia B      Palpation   Palpation comment hypomobile plantar fascia/ midfoot / limited DF?EV, toe abduction R > L  , tib-fib mobility ,          [facility] Adult PT Treatment/Exercise - 12/07/23 1638       Self-Care   Other Self-Care Comments  discussed avoiding flip flops , discused tennis shoes, and proiocptive insoles , discused role of planta fascia mobility for ascending./ descending hills for hiking and step aerobic exercises, and role related to pelvic floor , role to minimize prolapse      Neuro Re-ed    Neuro Re-ed Details  excessive cues for proioception and alignment fortransverse arch co-activation , knee abduction HEP to improve feet mobility/ knee alignment to minmize tight pelvic floor function and to imrpove planta fascia mobility needed in aerobic stepping classes      Manual Therapy   Manual therapy comments AP/PA mob Grade III, STM/MWM at areas ntoed of tightness and hypombility to improve DF/EV , toe abduction, ext,            HOME EXERCISE PROGRAM: See pt instruction section    ASSESSMENT:  CLINICAL IMPRESSION:  Pt has met      Pt reports  she has been able to sit on a hard rock on her hiking trip for 10-15 min  and her tailbone did not hurt  Pt has been able to stand for 2 hours without LBP until the end and it was at a lower level of pain Still better than before'   Pt has not noticed her prolapse a little but not much at all.  Pt has had Stool 4 consistency 100% of the time instead of 10-15% of the time. Pt is eating more fruit and fiber.     Improvements include:   Visit 2 :  Pt reports her tailbone is better on soft surfaces since last session  but not on hard surfaces.      Pt is practicing not crossing legs.       Vist 3 :  Pt reports her LBP resolved last week and she was able to stand talking to her neighbor for > 30  min and it did not hurt   Visit 4:   LBP continues to be resolved across 3 weeks .  Visit 4:  Pt reports tailbone seems better and she was able to kayak 2.5 hours  with minimal tailbone pain  Visit 5:  Pt reports less tailbone pain with soft chairs  Visit 6: Pt reports she hiked [employer] and ate lunch on a rock and experienced 50% less tailbone pain.  Visit 7: Pt reports she hiked [facility] and ate lunch on a rock and experienced no  tailbone pain.  Visit 8: LBP continues to be resolved but prolapse more noticeable after taking hip hop step class     This week, pt noticed prolapse was more noticeable after hip hop step class. LKC assessment showed limited mobility of plantar fascia, DF/EV, toe abduction R > L.  Manual Tx was applied to improve mobility . Explained role of LKC for pelvic girdle stability and improving prolapse and also with hiking / hip hop step classess. Discussed shoe wear for more mobility. Provided excessive cues for proioception and alignment fortransverse arch co-activation , knee abduction HEP to improve feet mobility/ knee alignment to minmize tight pelvic floor function and to imrpove planta fascia mobility needed in aerobic stepping classes . Plan to apply more manual Tx to [facility] and improve tib-fib joint mobility and minimize supination, adducted knees.   Anticipate these improvements will continue to promote optimize IAP system for improved pelvic floor function, trunk stability, gait, balance, stabilization with mobility tasks.  Plan to address pelvic floor issues once pelvis and spine are realigned to yield better outcomes.                                                       Pt benefits from skilled PT.    OBJECTIVE IMPAIRMENTS decreased activity tolerance, decreased coordination, decreased endurance, decreased mobility, difficulty walking, decreased ROM, decreased strength, decreased safety awareness, hypomobility, increased muscle spasms, impaired  flexibility, improper body mechanics, postural dysfunction, and pain. scar restrictions   ACTIVITY LIMITATIONS  self-care, sitting, hiking   PARTICIPATION LIMITATIONS:  community   PERSONAL FACTORS   affecting patient's functional outcome:    REHAB POTENTIAL: Good   CLINICAL DECISION MAKING: Evolving/moderate complexity   EVALUATION COMPLEXITY: Moderate    PATIENT EDUCATION:    Education details: Showed pt anatomy images. Explained muscles attachments/ connection, physiology of deep core system/ spinal- thoracic-pelvis-lower kinetic chain as they relate to pt's presentation, Sx, and past Hx. Explained what and how these areas of deficits need to be restored to balance and function    See Therapeutic activity / neuromuscular re-education section  Answered pt's questions.   Person educated: Patient Education method: Explanation, Demonstration, Tactile cues, Verbal cues, and Handouts Education comprehension: verbalized understanding, returned demonstration, verbal cues required, tactile cues required, and needs further education     PLAN: PT FREQUENCY: 1x/week   PT DURATION: 10 weeks   PLANNED INTERVENTIONS:   Gait training;Stair training;Functional mobility training;DME Instruction;Therapeutic activities;Therapeutic exercise;Balance training;Neuromuscular re-education;Patient/family education;Vestibular;Visual/perceptual remediation/compensation;Passive range of motion;Moist Heat;Cryotherapy;Traction;Canalith Repostioning;Joint Manipulations;Manual lymph drainage;Manual techniques;Scar mobilization;Energy conservation;Dry needling;ADLs/Self Care Home Management;Biofeedback;Electrical Stimulation;Taping    PLAN FOR NEXT SESSION: See clinical impression for plan     GOALS: Goals reviewed with patient? Yes  SHORT TERM GOALS: Target date: 11/13/2023    Pt will demo IND with HEP                    Baseline: Not IND            Goal status: INITIAL  LONG TERM GOALS:  Target  date:12/25/2023    1.Pt will demo proper deep core coordination without chest breathing and optimal excursion of diaphragm/pelvic floor  and demo proper movement of pelvic floor for upward position of pelvic organs in order to promote spinal stability and pelvic floor function  Baseline: dyscoordination Goal status: MET   2.  Pt will demo proper body mechanics in against gravity tasks and ADLs  work tasks, fitness  to minimize straining pelvic floor / back    Baseline: not IND, improper form that places strain on pelvic floor  Goal status:  MET     3. Pt will demo increased gait speed > 1.4 m/s with reciprocal gait pattern, longer stride length  in order to ambulate safely in community and return to fitness routine  Baseline:1.28 m/s,  Pelvic sway to R, limited R thoracic posterior rotaion  Goal status: MET ( 11/30/23:  2 m/s )     4. Pt will demo levelled pelvic girdle and shoulder height in order to progress to deep core strengthening HEP and restore mobility at spine, pelvis, gait, posture minimize falls, and return to hiking  Baseline: R shoulder /  iliac crest lowered  Goal status: MET    5. Pt will improve PFDI-7 questionnaire to  pts  score change  to demo improved QOL  Baseline:    ( greater pts indicate greater negative impact on QOL)    38 pts  ( total)   ---> 16    33  pts  ( UIQ-7 )   --> 10   0 pts  ( CRAIQ-7 )  --> 0  5   pts  ( POPIQ-7 )  --> 0  Baseline: plan to adminster  Goal status: MET     6.  Pt will report: Stool type 4   > 50% of the time   Baseline:  Type 4 across 10-15 %   of the time, Type 3 - 85-90% of the time Goal status: MET  Stool Type 4 at 100% of the time   7. Pt will report able to stand 1 hour and experience no LBP  in order to attend community events  Baseline:standing for 30 min and the pain starts and 3/10  Goal status: MET ( standing 2 hour without LBP until the end and it was at a lower level of pain)         Pia Lupe Plump, PT 12/07/2023, 10:22 AM

## 2023-12-12 ENCOUNTER — Encounter: Admitting: Physical Therapy

## 2023-12-14 ENCOUNTER — Ambulatory Visit: Admitting: Physical Therapy

## 2023-12-14 DIAGNOSIS — R278 Other lack of coordination: Secondary | ICD-10-CM | POA: Diagnosis not present

## 2023-12-14 DIAGNOSIS — M62838 Other muscle spasm: Secondary | ICD-10-CM | POA: Diagnosis not present

## 2023-12-14 DIAGNOSIS — R2689 Other abnormalities of gait and mobility: Secondary | ICD-10-CM | POA: Diagnosis not present

## 2023-12-14 DIAGNOSIS — M6281 Muscle weakness (generalized): Secondary | ICD-10-CM

## 2023-12-14 DIAGNOSIS — M533 Sacrococcygeal disorders, not elsewhere classified: Secondary | ICD-10-CM | POA: Diagnosis not present

## 2023-12-14 NOTE — Patient Instructions (Addendum)
 Feet care :  Self -feet massage   Handshake : fingers between toes, moving ballmounds/toes back and forth several times while other hand anchors at arch. Do the same at the hind/mid foot.  Heel to toes upward to a letter Big Letter T strokes to spread ballmounds and toes, several times, pinch between webs of toes  Run finger tips along top of foot between long bones comb between the bones    Wiggle toes and spread them out when relaxing    __      WALKING WITH RESISTANCE BLUE Band at waist connected to doorknob Stepping forward normal length steps, planting mid and forefoot down, center of mass ( navel) leans forward slightly as if you were walking uphill 3-4 steps till band feels taut ( MAKE SURE THE DOOR IS LOCKED AND WON'T OPEN)    Stepping backwards, lower heel slowly, carry trunk and hips back , leaning forward, front knee along 2-3 rd toe line      3 min     Strengthening feet arches:     Heel raises - heels together, minisquat Hold onto counter   Minisquat motion, trunk bent , gaze onto floor like you are looking at your reflection over a lake/pond,  Knees bent pointed out like a v , navel ( center of mass) more forward  Heels together as you lift, pointed out like a v  KNEES ARE ALIGNED BEHIND THE TOES TO MINIMIZE STRAIN ON THE KNEES your  navel ( center of mass) more forward to a avoid dropping down fast and rocking more weight back onto heels , keep heels pressing against each other the whole time   30 reps  ___ Minisquat motion, trunk bent , gaze onto floor like you are looking at your reflection over a lake/pond,  Knees bent pointed out like a v , navel ( center of mass) more forward  Heels together as you lift, pointed out like a v  KNEES ARE ALIGNED BEHIND THE TOES TO MINIMIZE STRAIN ON THE KNEES your  navel ( center of mass) more forward to a avoid dropping down fast and rocking more weight back onto heels , keep heels pressing against each  other the whole time   20 reps

## 2023-12-14 NOTE — Therapy (Signed)
 OUTPATIENT PHYSICAL THERAPY TREATMENT    Patient Name: Tiffany Zamora MRN: [id number] DOB:[birth year], 53 y.o., female Today's Date: 12/14/2023   PT End of Session - 12/14/23 1024     Visit Number 9    Number of Visits 10    Date for Recertification  12/25/23    Authorization Type 20 cal yr,    PT Start Time 1016    PT Stop Time 1100    PT Time Calculation (min) 44 min    Activity Tolerance Patient tolerated treatment well    Behavior During Therapy [facility] for tasks assessed/performed          Past Medical History:  Diagnosis Date   Benign hematuria ~2005   per pt normal workup with cystoscopy ([place] )   Heartburn    IBS (irritable bowel syndrome) 09/25/2015   Seasonal allergies    Urine incontinence    Past Surgical History:  Procedure Laterality Date   COLONOSCOPY  2000   for IBS sxs, WNL per patient   COLONOSCOPY WITH PROPOFOL  N/A 05/18/2020   Procedure: COLONOSCOPY WITH PROPOFOL ;  Surgeon: Unk Corinn Skiff, MD;  Location: [facility] ENDOSCOPY;  Service: Gastroenterology;  Laterality: N/A;   DILATION AND CURETTAGE OF UTERUS  08/2007   WISDOM TOOTH EXTRACTION  1993   Patient Active Problem List   Diagnosis Date Noted   Perimenopause 05/25/2023   Gastroesophageal reflux disease 05/25/2023   Moderate mixed hyperlipidemia not requiring statin therapy 05/25/2023   Frequency of urination 11/21/2022   Bilateral tinnitus 10/31/2022   Irritable bowel syndrome with both constipation and diarrhea 07/04/2022   Vitamin B12 deficiency 07/04/2022   Racing heart beat 05/30/2022   Mixed stress and urge urinary incontinence 03/31/2022   Urinary incontinence 05/23/2019   Shortness of breath 09/20/2017   Hypertriglyceridemia 09/25/2015   Benign hematuria     PCP: Prentice HAS   REFERRING PROVIDER: Bernardo, DO  REFERRING DIAG: Coccydynia  prolapse and urinary incontinence  Rationale for Evaluation and Treatment Rehabilitation  THERAPY DIAG:  Sacrococcygeal disorders, not  elsewhere classified  Other abnormalities of gait and mobility   ONSET DATE:   SUBJECTIVE:      SUBJECTIVE STATEMENT  TODAY:   Pt reports stopped taking the hip hop step class this past week Pt has been doing her feet exercises  SUBJECTIVE STATEMENT ON EVAL 10/16/23 :   1)   Urinary leakage:  pt does not wear pads,  occurs with having to go to the bathroom or when laughing or sometimes with walking , hiking downhill    2) LBP occurs at L/S location 1/10 and does back PT HEP. Pt notices standing for 30 min and the pain starts and 3/10  Eases with walking again    3) prolapse occurs when she is constipated  Stool consistency 4 is about 10-15% of the time, Type 3 across 85-90% of the time.   PERTINENT HISTORY:   Pt tried Pelvic PT in the past last Dec 2024 for 2 visits and she kept doing her exercises. They really helped her prolapse, LBP, incontinence.    Pt also started using estrogen cream which also helped a lot.   Pt went to [place]  recently  and rode the amusement park rides and only had one episode of leakage compared to more episodes wen she went last year. Pt hardly leaks when she laughs. Pt is so happy that has improved .   Hx of Vaginal delivery with perineal tears.   Pt enjoys hiking 3-10 miles  per trip   PAIN:  Are you having pain? Yes: See above   PRECAUTIONS: No   WEIGHT BEARING RESTRICTIONS: No   FALLS:  Has patient fallen in last 6 months?  No   LIVING ENVIRONMENT: Lives with: husband, dtr, dog  Lives in: two story  Stairs:  2 STE with rail   OCCUPATION: stay home mom   PLOF: IND   PATIENT GOALS:  Learn how to get better to have no pain in the low back pain, tailbone, and for prolapse to go away and up    OBJECTIVE:    [facility] PT Assessment - 12/14/23 1111       Other:   Other/ Comments poor eccentric control of plantar fascia in forward and backward stepping against resistance at waist,      Palpation   Palpation comment hypomobile plantar  fascia/ midfoot / limited DF/EV, toe abduction R          [facility] Adult PT Treatment/Exercise - 12/14/23 1109       Self-Care   Other Self-Care Comments  explaiend alignment and anatomy for upgraded exercises to help with [facility] and prolapse      Neuro Re-ed    Neuro Re-ed Details  excessive cues for hip/ knee abd/ eccentric control of planta fascia in  new upright , loaded exericses for [facility] and pelvic stability  which is important with hiking and minimizing prolapse with load of gravity      Manual Therapy   Manual therapy comments AP/PA mob Grade III at miidfoot joints, planta fascia at R          HOME EXERCISE PROGRAM: See pt instruction section    ASSESSMENT:  CLINICAL IMPRESSION:  Pt has met  the following goals:     Pt reports  she has been able to sit on a hard rock on her hiking trip for 10-15 min  and her tailbone did not hurt  Pt has been able to stand for 2 hours without LBP until the end and it was at a lower level of pain Still better than before'   Pt has not noticed her prolapse a little but not much at all.  Pt has had Stool 4 consistency 100% of the time instead of 10-15% of the time. Pt is eating more fruit and fiber.     Improvements include:   Visit 2 :  Pt reports her tailbone is better on soft surfaces since last session  but not on hard surfaces.      Pt is practicing not crossing legs.       Vist 3 :  Pt reports her LBP resolved last week and she was able to stand talking to her neighbor for > 30 min and it did not hurt   Visit 4:   LBP continues to be resolved across 3 weeks .  Visit 4:  Pt reports tailbone seems better and she was able to kayak 2.5 hours  with minimal tailbone pain    Visit 5:  Pt reports less tailbone pain with soft chairs  Visit 6: Pt reports she hiked [employer] and ate lunch on a rock and experienced 50% less tailbone pain.  Visit 7: Pt reports she hiked [place] [facility] and ate lunch on a rock and experienced no   tailbone pain.  Visit 8: LBP continues to be resolved but prolapse more noticeable after taking hip hop step class Visit 9: improved feet mobility to help with planta fascia strength  and pliability for eccentric control ( needed with hiking and minimizing prolapse on descend)      This week, continued to apply  manual Tx to R foot to minimize supination, adducted knees, and optimize plantar fascia mobility and strength. Progressed to upright exercises to further train propioception and alignment of LKC .  Provided excessive cues for hip/ knee abd/ eccentric control of planta fascia upright position , gravity- loaded exericses for [facility] and pelvic stability  which is important with hiking and minimizing prolapse with load of gravity   Anticipate these improvements will continue to promote optimize IAP system for improved pelvic floor function, trunk stability, gait, balance, stabilization with mobility tasks.  Plan to address pelvic floor issues once [facility] has regained mobility and alignment to yield better outcomes.                                                       Pt benefits from skilled PT.    OBJECTIVE IMPAIRMENTS decreased activity tolerance, decreased coordination, decreased endurance, decreased mobility, difficulty walking, decreased ROM, decreased strength, decreased safety awareness, hypomobility, increased muscle spasms, impaired flexibility, improper body mechanics, postural dysfunction, and pain. scar restrictions   ACTIVITY LIMITATIONS  self-care, sitting, hiking   PARTICIPATION LIMITATIONS:  community   PERSONAL FACTORS   affecting patient's functional outcome:    REHAB POTENTIAL: Good   CLINICAL DECISION MAKING: Evolving/moderate complexity   EVALUATION COMPLEXITY: Moderate    PATIENT EDUCATION:    Education details: Showed pt anatomy images. Explained muscles attachments/ connection, physiology of deep core system/ spinal- thoracic-pelvis-lower kinetic chain as they  relate to pt's presentation, Sx, and past Hx. Explained what and how these areas of deficits need to be restored to balance and function    See Therapeutic activity / neuromuscular re-education section  Answered pt's questions.   Person educated: Patient Education method: Explanation, Demonstration, Tactile cues, Verbal cues, and Handouts Education comprehension: verbalized understanding, returned demonstration, verbal cues required, tactile cues required, and needs further education     PLAN: PT FREQUENCY: 1x/week   PT DURATION: 10 weeks   PLANNED INTERVENTIONS:   Gait training;Stair training;Functional mobility training;DME Instruction;Therapeutic activities;Therapeutic exercise;Balance training;Neuromuscular re-education;Patient/family education;Vestibular;Visual/perceptual remediation/compensation;Passive range of motion;Moist Heat;Cryotherapy;Traction;Canalith Repostioning;Joint Manipulations;Manual lymph drainage;Manual techniques;Scar mobilization;Energy conservation;Dry needling;ADLs/Self Care Home Management;Biofeedback;Electrical Stimulation;Taping    PLAN FOR NEXT SESSION: See clinical impression for plan     GOALS: Goals reviewed with patient? Yes  SHORT TERM GOALS: Target date: 11/13/2023    Pt will demo IND with HEP                    Baseline: Not IND            Goal status: INITIAL  LONG TERM GOALS: Target date:12/25/2023    1.Pt will demo proper deep core coordination without chest breathing and optimal excursion of diaphragm/pelvic floor  and demo proper movement of pelvic floor for upward position of pelvic organs in order to promote spinal stability and pelvic floor function  Baseline: dyscoordination Goal status: MET   2.  Pt will demo proper body mechanics in against gravity tasks and ADLs  work tasks, fitness  to minimize straining pelvic floor / back    Baseline: not IND, improper form that places strain on pelvic floor  Goal status:  MET  3.  Pt will demo increased gait speed > 1.4 m/s with reciprocal gait pattern, longer stride length  in order to ambulate safely in community and return to fitness routine  Baseline:1.28 m/s,  Pelvic sway to R, limited R thoracic posterior rotaion  Goal status: MET ( 11/30/23:  2 m/s )     4. Pt will demo levelled pelvic girdle and shoulder height in order to progress to deep core strengthening HEP and restore mobility at spine, pelvis, gait, posture minimize falls, and return to hiking  Baseline: R shoulder /  iliac crest lowered  Goal status: MET    5. Pt will improve PFDI-7 questionnaire to  pts  score change  to demo improved QOL  Baseline:    ( greater pts indicate greater negative impact on QOL)    38 pts  ( total)   ---> 16    33  pts  ( UIQ-7 )   --> 10   0 pts  ( CRAIQ-7 )  --> 0  5   pts  ( POPIQ-7 )  --> 0  Baseline: plan to adminster  Goal status: MET     6.  Pt will report: Stool type 4   > 50% of the time   Baseline:  Type 4 across 10-15 %   of the time, Type 3 - 85-90% of the time Goal status: MET  Stool Type 4 at 100% of the time   7. Pt will report able to stand 1 hour and experience no LBP  in order to attend community events  Baseline:standing for 30 min and the pain starts and 3/10  Goal status: MET ( standing 2 hour without LBP until the end and it was at a lower level of pain)         Pia Lupe Plump, PT 12/14/2023, 11:13 AM

## 2023-12-19 ENCOUNTER — Encounter: Admitting: Physical Therapy

## 2023-12-21 ENCOUNTER — Ambulatory Visit: Admitting: Physical Therapy

## 2023-12-21 DIAGNOSIS — R2689 Other abnormalities of gait and mobility: Secondary | ICD-10-CM | POA: Diagnosis not present

## 2023-12-21 DIAGNOSIS — M533 Sacrococcygeal disorders, not elsewhere classified: Secondary | ICD-10-CM

## 2023-12-21 DIAGNOSIS — M6281 Muscle weakness (generalized): Secondary | ICD-10-CM | POA: Diagnosis not present

## 2023-12-21 DIAGNOSIS — R278 Other lack of coordination: Secondary | ICD-10-CM | POA: Diagnosis not present

## 2023-12-21 DIAGNOSIS — M62838 Other muscle spasm: Secondary | ICD-10-CM

## 2023-12-21 NOTE — Therapy (Signed)
 OUTPATIENT PHYSICAL THERAPY TREATMENT / Progress Note across 10 visits / RECERT from 10/16/23 to 12/21/23    Patient Name: Tristy Udovich MRN: [id number] DOB:August 07, 1970, 53 y.o., female Today's Date: 12/21/2023   PT End of Session - 12/21/23 1107     Visit Number 10    Number of Visits 20    Date for Recertification  02/29/24    Authorization Type 20 cal yr,    PT Start Time 1020    PT Stop Time 1100    PT Time Calculation (min) 40 min    Activity Tolerance Patient tolerated treatment well    Behavior During Therapy [facility] for tasks assessed/performed          Past Medical History:  Diagnosis Date   Benign hematuria ~2005   per pt normal workup with cystoscopy ([place] )   Heartburn    IBS (irritable bowel syndrome) 09/25/2015   Seasonal allergies    Urine incontinence    Past Surgical History:  Procedure Laterality Date   COLONOSCOPY  2000   for IBS sxs, WNL per patient   COLONOSCOPY WITH PROPOFOL  N/A 05/18/2020   Procedure: COLONOSCOPY WITH PROPOFOL ;  Surgeon: Unk Corinn Skiff, MD;  Location: [facility] ENDOSCOPY;  Service: Gastroenterology;  Laterality: N/A;   DILATION AND CURETTAGE OF UTERUS  08/2007   WISDOM TOOTH EXTRACTION  1993   Patient Active Problem List   Diagnosis Date Noted   Perimenopause 05/25/2023   Gastroesophageal reflux disease 05/25/2023   Moderate mixed hyperlipidemia not requiring statin therapy 05/25/2023   Frequency of urination 11/21/2022   Bilateral tinnitus 10/31/2022   Irritable bowel syndrome with both constipation and diarrhea 07/04/2022   Vitamin B12 deficiency 07/04/2022   Racing heart beat 05/30/2022   Mixed stress and urge urinary incontinence 03/31/2022   Urinary incontinence 05/23/2019   Shortness of breath 09/20/2017   Hypertriglyceridemia 09/25/2015   Benign hematuria     PCP: Prentice HAS   REFERRING PROVIDER: Bernardo, DO  REFERRING DIAG: Coccydynia  prolapse and urinary incontinence  Rationale for Evaluation and  Treatment Rehabilitation  THERAPY DIAG:  Sacrococcygeal disorders, not elsewhere classified  Other abnormalities of gait and mobility   ONSET DATE:   SUBJECTIVE:      SUBJECTIVE STATEMENT  TODAY:   Pt reports she continues with HEP   SUBJECTIVE STATEMENT ON EVAL 10/16/23 :   1)   Urinary leakage:  pt does not wear pads,  occurs with having to go to the bathroom or when laughing or sometimes with walking , hiking downhill    2) LBP occurs at L/S location 1/10 and does back PT HEP. Pt notices standing for 30 min and the pain starts and 3/10  Eases with walking again    3) prolapse occurs when she is constipated  Stool consistency 4 is about 10-15% of the time, Type 3 across 85-90% of the time.   PERTINENT HISTORY:   Pt tried Pelvic PT in the past last Dec 2024 for 2 visits and she kept doing her exercises. They really helped her prolapse, LBP, incontinence.    Pt also started using estrogen cream which also helped a lot.   Pt went to [place]  recently  and rode the amusement park rides and only had one episode of leakage compared to more episodes wen she went last year. Pt hardly leaks when she laughs. Pt is so happy that has improved .   Hx of Vaginal delivery with perineal tears.   Pt enjoys hiking 3-10 miles  per trip   PAIN:  Are you having pain? Yes: See above   PRECAUTIONS: No   WEIGHT BEARING RESTRICTIONS: No   FALLS:  Has patient fallen in last 6 months?  No   LIVING ENVIRONMENT: Lives with: husband, dtr, dog  Lives in: two story  Stairs:  2 STE with rail   OCCUPATION: stay home mom   PLOF: IND   PATIENT GOALS:  Learn how to get better to have no pain in the low back pain, tailbone, and for prolapse to go away and up    OBJECTIVE:     Pelvic Floor Special Questions - 12/21/23 1236     Pelvic Floor Internal Exam pt consented verbally and had no contraindications    Exam Type Vaginal    Palpation tightness along 2nd / 3rd layer at 6-9 o'clock, obt  int L, 12-3 o'clock,  lowered anterior wall within introitus standing and hooklying,  poor activation of all 3 layers when cued for contraction ( more dominant posterior activation without lift of anteriorwall) ,             [facility] Adult PT Treatment/Exercise - 12/21/23 1238       Self-Care   Other Self-Care Comments  explained internal pelvic assessment , anatomy images of 3 layers of pelvic floor, objective for today's session to assess how she has been doing kegels and withhold her kegels this week and perform pelvic stretches and deep core with proprioception and coordination  awareness of 3 layers of pelvic floor      Neuro Re-ed    Neuro Re-ed Details  tactile and propioceptive cues for circumferential and sequential contraction for 3 layesr of pelvic floor with deep core coordination and anterior tilt of pelvis , LKC coactivation in standing and hooklying, cued for less chest breathing with deep core in standing position      Manual Therapy   Internal Pelvic Floor STM/MWM to minimzie tightness at deep B pelvic floor, facilitated upward movement of pelvic floor ( urethral/ bladder border) to promote more circumferential/ sequential contraction of 3 layers of pelvic floor          HOME EXERCISE PROGRAM: See pt instruction section    ASSESSMENT:  CLINICAL IMPRESSION:  Pt has met  6/7 goals and progressing well towards remaining goals  Achieved the following goals:   Pt reports  she has been able to sit on a hard rock on her hiking trip for 10-15 min  and her tailbone did not hurt  Pt has been able to stand for 2 hours without LBP until the end and it was at a lower level of pain Still better than before'   Pt has not noticed her prolapse a little but not much at all.  Pt has had Stool 4 consistency 100% of the time instead of 10-15% of the time. Pt is eating more fruit and fiber.    Improvements include:   Visit 2 :  Pt reports her tailbone is better on soft surfaces  since last session  but not on hard surfaces.      Pt is practicing not crossing legs.       Vist 3 :  Pt reports her LBP resolved last week and she was able to stand talking to her neighbor for > 30 min and it did not hurt   Visit 4:   LBP continues to be resolved across 3 weeks .  Visit 4:  Pt reports tailbone seems better and  she was able to kayak 2.5 hours  with minimal tailbone pain    Visit 5:  Pt reports less tailbone pain with soft chairs  Visit 6: Pt reports she hiked [employer] and ate lunch on a rock and experienced 50% less tailbone pain.  Visit 7: Pt reports she hiked [place] [facility] and ate lunch on a rock and experienced no  tailbone pain.  Visit 8: LBP continues to be resolved but prolapse more noticeable after taking hip hop step class Visit 9: improved feet mobility to help with planta fascia strength and pliability for eccentric control ( needed with hiking and minimizing prolapse on descend)      Pt has shown good carry with stacked posture and improved   hip/ knee abd/ eccentric control of planta fascia  which is important with hiking and minimizing prolapse with load of gravity .     Assessed internal pelvic floor today which showed increased pelvic floor tightness and poor activation of all 3 layers of pelvic floor circumferentially/ sequential manner with incorrect kegel contraction technique.  Provided manual Tx to  minimzie tightness at deep B pelvic floor, facilitated upward   movement of pelvic floor ( urethral/ bladder border) to promote more circumferential/ sequential contraction of 3 layers of pelvic floor .   Provided tactile and propioceptive cues for circumferential and sequential contraction for 3 layesr of pelvic floor with deep core coordination and anterior tilt of pelvis , LKC coactivation in standing and hooklying, cued for less chest breathing with deep core in standing position.    Anticipate these improvements will continue to promote optimize IAP  system for improved pelvic floor function, trunk stability, gait, balance, stabilization with mobility tasks and  yield long term outcomes.    Plan to reassess pelvic floor and explain kegel contraction for endurance and quick holds if appropriate at next session if pt has no more tightness to pelvic floor mm.                                                      Pt benefits from skilled PT to achieve remaining goal related to prolapse occurring with hip hop step fitness class   OBJECTIVE IMPAIRMENTS decreased activity tolerance, decreased coordination, decreased endurance, decreased mobility, difficulty walking, decreased ROM, decreased strength, decreased safety awareness, hypomobility, increased muscle spasms, impaired flexibility, improper body mechanics, postural dysfunction, and pain. scar restrictions   ACTIVITY LIMITATIONS  self-care, sitting, hiking   PARTICIPATION LIMITATIONS:  community   PERSONAL FACTORS   affecting patient's functional outcome:    REHAB POTENTIAL: Good   CLINICAL DECISION MAKING: Evolving/moderate complexity   EVALUATION COMPLEXITY: Moderate    PATIENT EDUCATION:    Education details: Showed pt anatomy images. Explained muscles attachments/ connection, physiology of deep core system/ spinal- thoracic-pelvis-lower kinetic chain as they relate to pt's presentation, Sx, and past Hx. Explained what and how these areas of deficits need to be restored to balance and function    See Therapeutic activity / neuromuscular re-education section  Answered pt's questions.   Person educated: Patient Education method: Explanation, Demonstration, Tactile cues, Verbal cues, and Handouts Education comprehension: verbalized understanding, returned demonstration, verbal cues required, tactile cues required, and needs further education     PLAN: PT FREQUENCY: 1x/week   PT DURATION: 10 weeks   PLANNED INTERVENTIONS:  Gait training;Stair training;Functional mobility  training;DME Instruction;Therapeutic activities;Therapeutic exercise;Balance training;Neuromuscular re-education;Patient/family education;Vestibular;Visual/perceptual remediation/compensation;Passive range of motion;Moist Heat;Cryotherapy;Traction;Canalith Repostioning;Joint Manipulations;Manual lymph drainage;Manual techniques;Scar mobilization;Energy conservation;Dry needling;ADLs/Self Care Home Management;Biofeedback;Electrical Stimulation;Taping    PLAN FOR NEXT SESSION: See clinical impression for plan     GOALS: Goals reviewed with patient? Yes  SHORT TERM GOALS: Target date: 11/13/2023    Pt will demo IND with HEP                    Baseline: Not IND            Goal status: MET   LONG TERM GOALS: Target date:02/29/2024      1.Pt will demo proper deep core coordination without chest breathing and optimal excursion of diaphragm/pelvic floor  and demo proper movement of pelvic floor for upward position of pelvic organs in order to promote spinal stability and pelvic floor function  Baseline: dyscoordination Goal status: MET   2.  Pt will demo proper body mechanics in against gravity tasks and ADLs  work tasks, fitness  to minimize straining pelvic floor / back    Baseline: not IND, improper form that places strain on pelvic floor  Goal status:  MET     3. Pt will demo increased gait speed > 1.4 m/s with reciprocal gait pattern, longer stride length  in order to ambulate safely in community and return to fitness routine  Baseline:1.28 m/s,  Pelvic sway to R, limited R thoracic posterior rotaion  Goal status: MET ( 11/30/23:  2 m/s )     4. Pt will demo levelled pelvic girdle and shoulder height in order to progress to deep core strengthening HEP and restore mobility at spine, pelvis, gait, posture minimize falls, and return to hiking  Baseline: R shoulder /  iliac crest lowered  Goal status: MET    5. Pt will improve PFDI-7 questionnaire to  pts  score change  to demo  improved QOL  Baseline:    ( greater pts indicate greater negative impact on QOL)    38 pts  ( total)   ---> 16    33  pts  ( UIQ-7 )   --> 10   0 pts  ( CRAIQ-7 )  --> 0  5   pts  ( POPIQ-7 )  --> 0  Baseline: plan to adminster  Goal status: MET     6.  Pt will report: Stool type 4   > 50% of the time   Baseline:  Type 4 across 10-15 %   of the time, Type 3 - 85-90% of the time Goal status: MET  Stool Type 4 at 100% of the time   7. Pt will report able to stand 1 hour and experience no LBP  in order to attend community events  Baseline:standing for 30 min and the pain starts and 3/10  Goal status: MET ( standing 2 hour without LBP until the end and it was at a lower level of pain)     8. Pt will demo correct activation of all 3 layers of pelvic floor circumferentially/ sequential manner to help promote upward position of pelvic organs and report decreased prolapse Sx with return to hip hop step fitness class  [place]: poor activation of all 3 layers of pelvic floor circumferentially/ sequential  Goal Status: INITIAL    Pia Lupe Plump, PT 12/21/2023, 12:56 PM

## 2023-12-21 NOTE — Patient Instructions (Signed)
 Stretch for pelvic floor   1) V- slides  Scoot hips to L,  move L foot forward and then   back toward buttocks and then rock knee to slight to R,  slide heel along at 11 o clock away from buttocks   10 reps   Switch to other side, scoot to the R, move R foot forward and then   back toward buttocks and then rock knee to slight to L,  slide heel along at 1 o clock away from buttocks   2) Knee circles   __  Withhold on your kegels   Practice deep core level 1-2 with awareness of pelvic floor

## 2023-12-28 ENCOUNTER — Ambulatory Visit: Admitting: Physical Therapy

## 2023-12-28 DIAGNOSIS — M533 Sacrococcygeal disorders, not elsewhere classified: Secondary | ICD-10-CM

## 2023-12-28 DIAGNOSIS — M6281 Muscle weakness (generalized): Secondary | ICD-10-CM | POA: Diagnosis not present

## 2023-12-28 DIAGNOSIS — R278 Other lack of coordination: Secondary | ICD-10-CM | POA: Diagnosis not present

## 2023-12-28 DIAGNOSIS — M62838 Other muscle spasm: Secondary | ICD-10-CM | POA: Diagnosis not present

## 2023-12-28 DIAGNOSIS — R2689 Other abnormalities of gait and mobility: Secondary | ICD-10-CM

## 2023-12-28 NOTE — Therapy (Signed)
 OUTPATIENT PHYSICAL THERAPY TREATMENT   Patient Name: Tiffany Zamora MRN: [id number] DOB:1970/08/29, 53 y.o., female Today's Date: 12/28/2023   PT End of Session - 12/28/23 1239     Visit Number 11    Number of Visits 20    Date for Recertification  02/29/24    Authorization Type 20 cal yr,    PT Start Time 1020    PT Stop Time 1055    PT Time Calculation (min) 35 min    Activity Tolerance Patient tolerated treatment well    Behavior During Therapy [facility] for tasks assessed/performed          Past Medical History:  Diagnosis Date   Benign hematuria ~2005   per pt normal workup with cystoscopy ([place] )   Heartburn    IBS (irritable bowel syndrome) 09/25/2015   Seasonal allergies    Urine incontinence    Past Surgical History:  Procedure Laterality Date   COLONOSCOPY  2000   for IBS sxs, WNL per patient   COLONOSCOPY WITH PROPOFOL  N/A 05/18/2020   Procedure: COLONOSCOPY WITH PROPOFOL ;  Surgeon: Unk Corinn Skiff, MD;  Location: [facility] ENDOSCOPY;  Service: Gastroenterology;  Laterality: N/A;   DILATION AND CURETTAGE OF UTERUS  08/2007   WISDOM TOOTH EXTRACTION  1993   Patient Active Problem List   Diagnosis Date Noted   Perimenopause 05/25/2023   Gastroesophageal reflux disease 05/25/2023   Moderate mixed hyperlipidemia not requiring statin therapy 05/25/2023   Frequency of urination 11/21/2022   Bilateral tinnitus 10/31/2022   Irritable bowel syndrome with both constipation and diarrhea 07/04/2022   Vitamin B12 deficiency 07/04/2022   Racing heart beat 05/30/2022   Mixed stress and urge urinary incontinence 03/31/2022   Urinary incontinence 05/23/2019   Shortness of breath 09/20/2017   Hypertriglyceridemia 09/25/2015   Benign hematuria     PCP: Prentice HAS   REFERRING PROVIDER: Bernardo, DO  REFERRING DIAG: Coccydynia  prolapse and urinary incontinence  Rationale for Evaluation and Treatment Rehabilitation  THERAPY DIAG:  Sacrococcygeal disorders, not  elsewhere classified  Other abnormalities of gait and mobility   ONSET DATE:   SUBJECTIVE:      SUBJECTIVE STATEMENT  TODAY:   Pt noticed  prolapse is in more upper place since last session Pt reports she feels she does not empty fully with urination  Pt also has done sit up and crunches for years but not lately   SUBJECTIVE STATEMENT ON EVAL 10/16/23 :   1)   Urinary leakage:  pt does not wear pads,  occurs with having to go to the bathroom or when laughing or sometimes with walking , hiking downhill    2) LBP occurs at L/S location 1/10 and does back PT HEP. Pt notices standing for 30 min and the pain starts and 3/10  Eases with walking again    3) prolapse occurs when she is constipated  Stool consistency 4 is about 10-15% of the time, Type 3 across 85-90% of the time.   PERTINENT HISTORY:   Pt tried Pelvic PT in the past last Dec 2024 for 2 visits and she kept doing her exercises. They really helped her prolapse, LBP, incontinence.    Pt also started using estrogen cream which also helped a lot.   Pt went to [place]  recently  and rode the amusement park rides and only had one episode of leakage compared to more episodes wen she went last year. Pt hardly leaks when she laughs. Pt is so happy that has  improved .   Hx of Vaginal delivery with perineal tears.   Pt enjoys hiking 3-10 miles per trip   PAIN:  Are you having pain? Yes: See above   PRECAUTIONS: No   WEIGHT BEARING RESTRICTIONS: No   FALLS:  Has patient fallen in last 6 months?  No   LIVING ENVIRONMENT: Lives with: husband, dtr, dog  Lives in: two story  Stairs:  2 STE with rail   OCCUPATION: stay home mom   PLOF: IND   PATIENT GOALS:  Learn how to get better to have no pain in the low back pain, tailbone, and for prolapse to go away and up    OBJECTIVE:    Pelvic Floor Special Questions - 12/28/23 1246     Pelvic Floor Internal Exam pt consented verbally and had no contraindications    Exam  Type Vaginal    Palpation tightness along 1-3 rd layers anterior mm internal and external ( pubic symphysis) to promote sequential, circumferential contraction of pelvic floor for upward position of bladder and optimal relaxation of urethral sphincter            [facility] Adult PT Treatment/Exercise - 12/28/23 1240       Neuro Re-ed    Neuro Re-ed Details  cued for propioception of anterior pelvic tilt/ LKC propioception to minimize anterior pelvic floor tightness, cued for pelvic floor stretches with alignment and technique/ propioception   Explained tightness of anterior pelvic floor mm and posterior tilt of pelvis are related to past movement patterns with sit up and crunches / slumped posture / adducted knees      Manual Therapy   Internal Pelvic Floor STM/MWM to minimzie tightness at anterior 1-3 layers of  pelvic floor, facilitated upward movement of pelvic floor ( urehtral/ bladder border) to promote more circumferential/ sequential contraction of 3 layers of pelvic floor            HOME EXERCISE PROGRAM: See pt instruction section    ASSESSMENT:  CLINICAL IMPRESSION:  Pt has met  6/7 goals and progressing well towards remaining goals  Achieved the following goals:   Pt reports  she has been able to sit on a hard rock on her hiking trip for 10-15 min  and her tailbone did not hurt  Pt has been able to stand for 2 hours without LBP until the end and it was at a lower level of pain Still better than before'   Pt has not noticed her prolapse a little but not much at all.  Pt has had Stool 4 consistency 100% of the time instead of 10-15% of the time. Pt is eating more fruit and fiber.    Improvements include:   Visit 2 :  Pt reports her tailbone is better on soft surfaces since last session  but not on hard surfaces.      Pt is practicing not crossing legs.       Vist 3 :  Pt reports her LBP resolved last week and she was able to stand talking to her neighbor for > 30  min and it did not hurt   Visit 4:   LBP continues to be resolved across 3 weeks .  Visit 4:  Pt reports tailbone seems better and she was able to kayak 2.5 hours  with minimal tailbone pain    Visit 5:  Pt reports less tailbone pain with soft chairs  Visit 6: Pt reports she hiked [employer] and ate lunch on  a rock and experienced 50% less tailbone pain.  Visit 7: Pt reports she hiked [facility] and ate lunch on a rock and experienced no  tailbone pain.  Visit 8: LBP continues to be resolved but prolapse more noticeable after taking hip hop step class Visit 9: improved feet mobility to help with planta fascia strength and pliability for eccentric control ( needed with hiking and minimizing prolapse on descend)    Pt showed good carry over with upward position of bladder and less posterior mm tightness compared to last session  Continued with  internal pelvic floor Tx to minimize anterior pelvic floor mm .   Post Tx, pt demo'd improved anterior pelvic floor tightness and achieved proper circumferential and sequential contractions. Still withholding kegel contractions due to overactivity of pelvic floor mm  Explained tightness of anterior pelvic floor mm and posterior tilt of pelvis are related to past movement patterns with sit up and crunches / slumped posture / adducted knees    Anticipate these improvements will continue to promote optimize IAP system for improved pelvic floor function, trunk stability, gait, balance, stabilization with mobility tasks and  yield long term outcomes.    Plan to reassess pelvic floor and explain kegel contraction for endurance and quick holds if appropriate at next session if pt has no more tightness to pelvic floor mm.                                                      Pt benefits from skilled PT to achieve remaining goal related to prolapse occurring with hip hop step fitness class   OBJECTIVE IMPAIRMENTS decreased activity tolerance,  decreased coordination, decreased endurance, decreased mobility, difficulty walking, decreased ROM, decreased strength, decreased safety awareness, hypomobility, increased muscle spasms, impaired flexibility, improper body mechanics, postural dysfunction, and pain. scar restrictions   ACTIVITY LIMITATIONS  self-care, sitting, hiking   PARTICIPATION LIMITATIONS:  community   PERSONAL FACTORS   affecting patient's functional outcome:    REHAB POTENTIAL: Good   CLINICAL DECISION MAKING: Evolving/moderate complexity   EVALUATION COMPLEXITY: Moderate    PATIENT EDUCATION:    Education details: Showed pt anatomy images. Explained muscles attachments/ connection, physiology of deep core system/ spinal- thoracic-pelvis-lower kinetic chain as they relate to pt's presentation, Sx, and past Hx. Explained what and how these areas of deficits need to be restored to balance and function    See Therapeutic activity / neuromuscular re-education section  Answered pt's questions.   Person educated: Patient Education method: Explanation, Demonstration, Tactile cues, Verbal cues, and Handouts Education comprehension: verbalized understanding, returned demonstration, verbal cues required, tactile cues required, and needs further education     PLAN: PT FREQUENCY: 1x/week   PT DURATION: 10 weeks   PLANNED INTERVENTIONS:   Gait training;Stair training;Functional mobility training;DME Instruction;Therapeutic activities;Therapeutic exercise;Balance training;Neuromuscular re-education;Patient/family education;Vestibular;Visual/perceptual remediation/compensation;Passive range of motion;Moist Heat;Cryotherapy;Traction;Canalith Repostioning;Joint Manipulations;Manual lymph drainage;Manual techniques;Scar mobilization;Energy conservation;Dry needling;ADLs/Self Care Home Management;Biofeedback;Electrical Stimulation;Taping    PLAN FOR NEXT SESSION: See clinical impression for plan     GOALS: Goals  reviewed with patient? Yes  SHORT TERM GOALS: Target date: 11/13/2023    Pt will demo IND with HEP                    Baseline: Not IND  Goal status: MET   LONG TERM GOALS: Target date:02/29/2024      1.Pt will demo proper deep core coordination without chest breathing and optimal excursion of diaphragm/pelvic floor  and demo proper movement of pelvic floor for upward position of pelvic organs in order to promote spinal stability and pelvic floor function  Baseline: dyscoordination Goal status: MET   2.  Pt will demo proper body mechanics in against gravity tasks and ADLs  work tasks, fitness  to minimize straining pelvic floor / back    Baseline: not IND, improper form that places strain on pelvic floor  Goal status:  MET     3. Pt will demo increased gait speed > 1.4 m/s with reciprocal gait pattern, longer stride length  in order to ambulate safely in community and return to fitness routine  Baseline:1.28 m/s,  Pelvic sway to R, limited R thoracic posterior rotaion  Goal status: MET ( 11/30/23:  2 m/s )     4. Pt will demo levelled pelvic girdle and shoulder height in order to progress to deep core strengthening HEP and restore mobility at spine, pelvis, gait, posture minimize falls, and return to hiking  Baseline: R shoulder /  iliac crest lowered  Goal status: MET    5. Pt will improve PFDI-7 questionnaire to  pts  score change  to demo improved QOL  Baseline:    ( greater pts indicate greater negative impact on QOL)    38 pts  ( total)   ---> 16    33  pts  ( UIQ-7 )   --> 10   0 pts  ( CRAIQ-7 )  --> 0  5   pts  ( POPIQ-7 )  --> 0  Baseline: plan to adminster  Goal status: MET     6.  Pt will report: Stool type 4   > 50% of the time   Baseline:  Type 4 across 10-15 %   of the time, Type 3 - 85-90% of the time Goal status: MET  Stool Type 4 at 100% of the time   7. Pt will report able to stand 1 hour and experience no LBP  in order to attend  community events  Baseline:standing for 30 min and the pain starts and 3/10  Goal status: MET ( standing 2 hour without LBP until the end and it was at a lower level of pain)     8. Pt will demo correct activation of all 3 layers of pelvic floor circumferentially/ sequential manner to help promote upward position of pelvic organs and report decreased prolapse Sx with return to hip hop step fitness class  [place]: poor activation of all 3 layers of pelvic floor circumferentially/ sequential  Goal Status: INITIAL    Pia Lupe Plump, PT 12/28/2023, 12:46 PM

## 2024-01-02 ENCOUNTER — Ambulatory Visit: Admitting: Internal Medicine

## 2024-01-02 ENCOUNTER — Encounter: Payer: Self-pay | Admitting: Internal Medicine

## 2024-01-02 ENCOUNTER — Other Ambulatory Visit: Payer: Self-pay

## 2024-01-02 VITALS — BP 124/82 | HR 82 | Temp 98.3°F | Resp 16 | Ht 66.0 in | Wt 175.6 lb

## 2024-01-02 DIAGNOSIS — N3000 Acute cystitis without hematuria: Secondary | ICD-10-CM

## 2024-01-02 DIAGNOSIS — L729 Follicular cyst of the skin and subcutaneous tissue, unspecified: Secondary | ICD-10-CM | POA: Diagnosis not present

## 2024-01-02 DIAGNOSIS — R300 Dysuria: Secondary | ICD-10-CM | POA: Diagnosis not present

## 2024-01-02 LAB — POCT URINALYSIS DIPSTICK
Bilirubin, UA: NEGATIVE
Glucose, UA: NEGATIVE
Ketones, UA: NEGATIVE
Protein, UA: POSITIVE — AB
Spec Grav, UA: 1.02 (ref 1.010–1.025)
Urobilinogen, UA: 0.2 U/dL
pH, UA: 5 (ref 5.0–8.0)

## 2024-01-02 MED ORDER — SULFAMETHOXAZOLE-TRIMETHOPRIM 800-160 MG PO TABS
1.0000 | ORAL_TABLET | Freq: Two times a day (BID) | ORAL | 0 refills | Status: AC
Start: 2024-01-02 — End: 2024-01-05

## 2024-01-02 MED ORDER — CLINDAMYCIN PHOS (TWICE-DAILY) 1 % EX GEL
Freq: Two times a day (BID) | CUTANEOUS | 0 refills | Status: AC
Start: 2024-01-02 — End: ?

## 2024-01-02 NOTE — Progress Notes (Signed)
 Acute Office Visit  Subjective:     Patient ID: Tiffany Zamora, female    DOB: 22-Jul-1970, 53 y.o.   MRN: [id number]  Chief Complaint  Patient presents with   Urinary Frequency    burning    Urinary Frequency  Associated symptoms include frequency. Pertinent negatives include no chills, hematuria or urgency.   Patient is in today for dysuria.   Discussed the use of AI scribe software for clinical note transcription with the patient, who gave verbal consent to proceed.  History of Present Illness Tiffany Zamora is a 53 year old female who presents with symptoms of a urinary tract infection.  Urinalysis shows nitrates and leukocytes, consistent with a urinary tract infection. She has no known allergies to antibiotics and is concerned about resolving the UTI before her upcoming cruise in less than two weeks.  She has a boil or cyst on the edge of her buttock, similar to a previous issue on her inner thigh that resolved with a hot compress. The current cyst appeared a week after the previous one resolved and is described as healing.  Her social history includes an upcoming cruise to the [place], which she has done multiple times over a fifteen-year span for her anniversary. She also recently completed a ten-mile hike in the mountains, during which she may not have consumed enough water, potentially contributing to her UTI symptoms.   Review of Systems  Constitutional:  Negative for chills and fever.  Genitourinary:  Positive for dysuria and frequency. Negative for hematuria and urgency.        Objective:    BP 124/82 (Cuff Size: Large)   Pulse 82   Temp 98.3 F (36.8 C) (Oral)   Resp 16   Ht 5' 6 (1.676 m)   Wt 175 lb 9.6 oz (79.7 kg)   LMP 02/02/2022 (Exact Date)   SpO2 98%   BMI 28.34 kg/m    Physical Exam Constitutional:      Appearance: Normal appearance.  HENT:     Head: Normocephalic and atraumatic.  Eyes:     Conjunctiva/sclera: Conjunctivae  normal.  Cardiovascular:     Rate and Rhythm: Normal rate and regular rhythm.  Pulmonary:     Effort: Pulmonary effort is normal.     Breath sounds: Normal breath sounds.  Skin:    General: Skin is warm and dry.     Comments: Small healing cyst under left gluteal cleft, no infection  Neurological:     General: No focal deficit present.     Mental Status: She is alert. Mental status is at baseline.  Psychiatric:        Mood and Affect: Mood normal.        Behavior: Behavior normal.     Results for orders placed or performed in visit on 01/02/24  POCT urinalysis dipstick  Result Value Ref Range   Color, UA yellow    Clarity, UA clear    Glucose, UA Negative Negative   Bilirubin, UA negative    Ketones, UA negative    Spec Grav, UA 1.020 1.010 - 1.025   Blood, UA small    pH, UA 5.0 5.0 - 8.0   Protein, UA Positive (A) Negative   Urobilinogen, UA 0.2 0.2 or 1.0 E.U./dL   Nitrite, UA small    Leukocytes, UA Small (1+) (A) Negative   Appearance clear    Odor none         Assessment & Plan:  Assessment & Plan Urinary tract infection Confirmed by urinalysis with nitrates and leukocytes. Early-stage infection suitable for treatment. Discussed antibiotic resistance and culture necessity. - Prescribe Bactrim  (sulfamethoxazole /trimethoprim ) twice daily for three days. - Send urine sample for culture to identify bacteria and check for antibiotic resistance. - Advise adequate hydration. - Discuss use of Azo for symptomatic relief, noting potential urine discoloration. - Inform that if culture shows resistance to Bactrim , an alternative antibiotic will be prescribed.  Epidermal cyst of buttock Cyst likely due to genetic predisposition, not infected, showing signs of healing. Discussed cysts vs. boils and potential for hidradenitis suppurativa. - Prescribe topical clindamycin gel for application to the cyst. - Advise on use of over-the-counter bacitracin or Neosporin if  insurance does not cover clindamycin. - Recommend wearing breathable, sweat-wicking clothing to reduce recurrence.  - POCT urinalysis dipstick - Urine Culture - sulfamethoxazole -trimethoprim  (BACTRIM  DS) 800-160 MG tablet; Take 1 tablet by mouth 2 (two) times daily for 3 days.  Dispense: 6 tablet; Refill: 0 - clindamycin (CLINDAGEL) 1 % gel; Apply topically 2 (two) times daily.  Dispense: 30 g; Refill: 0   Return for already scheduled.  Sharyle Fischer, DO

## 2024-01-04 ENCOUNTER — Ambulatory Visit: Admitting: Physical Therapy

## 2024-01-04 ENCOUNTER — Ambulatory Visit: Payer: Self-pay | Admitting: Internal Medicine

## 2024-01-04 LAB — URINE CULTURE
MICRO NUMBER:: 17157971
SPECIMEN QUALITY:: ADEQUATE

## 2024-01-29 DIAGNOSIS — R3129 Other microscopic hematuria: Secondary | ICD-10-CM | POA: Diagnosis not present

## 2024-01-29 DIAGNOSIS — Z79890 Hormone replacement therapy (postmenopausal): Secondary | ICD-10-CM | POA: Diagnosis not present

## 2024-01-29 DIAGNOSIS — Z01411 Encounter for gynecological examination (general) (routine) with abnormal findings: Secondary | ICD-10-CM | POA: Diagnosis not present

## 2024-01-29 DIAGNOSIS — Z01419 Encounter for gynecological examination (general) (routine) without abnormal findings: Secondary | ICD-10-CM | POA: Diagnosis not present

## 2024-01-29 DIAGNOSIS — N951 Menopausal and female climacteric states: Secondary | ICD-10-CM | POA: Diagnosis not present

## 2024-01-29 DIAGNOSIS — Z124 Encounter for screening for malignant neoplasm of cervix: Secondary | ICD-10-CM | POA: Diagnosis not present

## 2024-01-30 ENCOUNTER — Ambulatory Visit: Attending: Internal Medicine | Admitting: Physical Therapy

## 2024-01-30 DIAGNOSIS — M6281 Muscle weakness (generalized): Secondary | ICD-10-CM | POA: Diagnosis not present

## 2024-01-30 DIAGNOSIS — R2689 Other abnormalities of gait and mobility: Secondary | ICD-10-CM | POA: Insufficient documentation

## 2024-01-30 DIAGNOSIS — M533 Sacrococcygeal disorders, not elsewhere classified: Secondary | ICD-10-CM | POA: Insufficient documentation

## 2024-01-30 DIAGNOSIS — M62838 Other muscle spasm: Secondary | ICD-10-CM | POA: Insufficient documentation

## 2024-01-30 DIAGNOSIS — R278 Other lack of coordination: Secondary | ICD-10-CM | POA: Insufficient documentation

## 2024-01-30 NOTE — Therapy (Signed)
 OUTPATIENT PHYSICAL THERAPY TREATMENT  / Discharge Summary  across 12 visits   Patient Name: Tiffany Zamora MRN: [id number] DOB:[birth year], 53 y.o., female Today's Date: 01/30/2024   PT End of Session - 01/30/24 1554     Visit Number 12    Number of Visits 20    Date for Recertification  02/29/24    Authorization Type 20 cal yr,    PT Start Time 1550    PT Stop Time 1610    PT Time Calculation (min) 20 min    Activity Tolerance Patient tolerated treatment well    Behavior During Therapy [facility] for tasks assessed/performed           Past Medical History:  Diagnosis Date   Benign hematuria ~2005   per pt normal workup with cystoscopy ([place] )   Heartburn    IBS (irritable bowel syndrome) 09/25/2015   Seasonal allergies    Urine incontinence    Past Surgical History:  Procedure Laterality Date   COLONOSCOPY  2000   for IBS sxs, WNL per patient   COLONOSCOPY WITH PROPOFOL  N/A 05/18/2020   Procedure: COLONOSCOPY WITH PROPOFOL ;  Surgeon: Unk Corinn Skiff, MD;  Location: [facility] ENDOSCOPY;  Service: Gastroenterology;  Laterality: N/A;   DILATION AND CURETTAGE OF UTERUS  08/2007   WISDOM TOOTH EXTRACTION  1993   Patient Active Problem List   Diagnosis Date Noted   Perimenopause 05/25/2023   Gastroesophageal reflux disease 05/25/2023   Moderate mixed hyperlipidemia not requiring statin therapy 05/25/2023   Frequency of urination 11/21/2022   Bilateral tinnitus 10/31/2022   Irritable bowel syndrome with both constipation and diarrhea 07/04/2022   Vitamin B12 deficiency 07/04/2022   Racing heart beat 05/30/2022   Mixed stress and urge urinary incontinence 03/31/2022   Urinary incontinence 05/23/2019   Shortness of breath 09/20/2017   Hypertriglyceridemia 09/25/2015   Benign hematuria     PCP: Prentice HAS   REFERRING PROVIDER: Bernardo, DO  REFERRING DIAG: Coccydynia  prolapse and urinary incontinence  Rationale for Evaluation and Treatment Rehabilitation  THERAPY  DIAG:  Sacrococcygeal disorders, not elsewhere classified  Other abnormalities of gait and mobility   ONSET DATE:   SUBJECTIVE:      SUBJECTIVE STATEMENT  TODAY:   Pt declined internal pelvic floor assessment , concerned to get another UTI   Pt has noticed prolapse is further up   SUBJECTIVE STATEMENT ON EVAL 10/16/23 :   1)   Urinary leakage:  pt does not wear pads,  occurs with having to go to the bathroom or when laughing or sometimes with walking , hiking downhill    2) LBP occurs at L/S location 1/10 and does back PT HEP. Pt notices standing for 30 min and the pain starts and 3/10  Eases with walking again    3) prolapse occurs when she is constipated  Stool consistency 4 is about 10-15% of the time, Type 3 across 85-90% of the time.   PERTINENT HISTORY:   Pt tried Pelvic PT in the past last Dec 2024 for 2 visits and she kept doing her exercises. They really helped her prolapse, LBP, incontinence.    Pt also started using estrogen cream which also helped a lot.   Pt went to [place]  recently  and rode the amusement park rides and only had one episode of leakage compared to more episodes wen she went last year. Pt hardly leaks when she laughs. Pt is so happy that has improved .   Hx of Vaginal  delivery with perineal tears.   Pt enjoys hiking 3-10 miles per trip   PAIN:  Are you having pain? Yes: See above   PRECAUTIONS: No   WEIGHT BEARING RESTRICTIONS: No   FALLS:  Has patient fallen in last 6 months?  No   LIVING ENVIRONMENT: Lives with: husband, dtr, dog  Lives in: two story  Stairs:  2 STE with rail   OCCUPATION: stay home mom   PLOF: IND   PATIENT GOALS:  Learn how to get better to have no pain in the low back pain, tailbone, and for prolapse to go away and up    OBJECTIVE:     [facility] PT Assessment - 01/30/24 1626       Sit to Stand   Comments no more adducted knees,      Other:   Other/ Comments standing and talking with weight shifted to  one hip/LE          [facility] Adult PT Treatment/Exercise - 01/30/24 1627       Self-Care   Other Self-Care Comments  discussed d/c continuation of HEP, stretching post hiking,  emphasized deep core HEP helps with pelvic floor and prolapse along with LKC HEP and spinal HEP , educated if issues arise, pt can return for follow up with pelvic floor PT with new referral, encouraged pt to maintain relaxation practices to minimize tightness of mm in body which also helps with pelvic floor function, kegel training is not always appropriate with overactivity of pelvic floor      Neuro Re-ed    Neuro Re-ed Details  cued for equal weight bearing across bbLE . hips to minimzie asymmetrical posture            HOME EXERCISE PROGRAM: See pt instruction section    ASSESSMENT:  CLINICAL IMPRESSION:  Pt has met  6/7 goals and progressing well towards remaining goals  Achieved the following goals:   Pt reports  she has been able to sit on a hard rock on her hiking trip for 10-15 min  and her tailbone did not hurt  Pt has been able to stand for 2 hours without LBP until the end and it was at a lower level of pain Still better than before'   Pt has not noticed her prolapse a little but not much at all.  Pt has had Stool 4 consistency 100% of the time instead of 10-15% of the time. Pt is eating more fruit and fiber.    Improvements include:   Visit 2 :  Pt reports her tailbone is better on soft surfaces since last session  but not on hard surfaces.      Pt is practicing not crossing legs.       Vist 3 :  Pt reports her LBP resolved last week and she was able to stand talking to her neighbor for > 30 min and it did not hurt   Visit 4:   LBP continues to be resolved across 3 weeks .  Visit 4:  Pt reports tailbone seems better and she was able to kayak 2.5 hours  with minimal tailbone pain    Visit 5:  Pt reports less tailbone pain with soft chairs  Visit 6: Pt reports she hiked [employer] and  ate lunch on a rock and experienced 50% less tailbone pain.  Visit 7: Pt reports she hiked [place] [facility] and ate lunch on a rock and experienced no  tailbone pain.  Visit 8:  LBP continues to be resolved but prolapse more noticeable after taking hip hop step class Visit 9: improved feet mobility to help with planta fascia strength and pliability for eccentric control ( needed with hiking and minimizing prolapse on descend)  Visit 10   Pt declined internal pelvic floor assessment. Discussed d/c continuation of HEP, stretching post hiking, emphasized deep core HEP helps with pelvic floor and prolapse along with LKC HEP and spinal HEP , educated if issues arise, pt can return for follow up with pelvic floor PT with new referral, encouraged pt to maintain relaxation practices to minimize tightness of mm in body which also helps with pelvic floor function, kegel training is not always appropriate with overactivity of pelvic floor   Pt is ready for d/c at this as pt has made significant improvements with prolapse, tailbone , LBP issues and returned to ADLs, social activities and hiking.      OBJECTIVE IMPAIRMENTS decreased activity tolerance, decreased coordination, decreased endurance, decreased mobility, difficulty walking, decreased ROM, decreased strength, decreased safety awareness, hypomobility, increased muscle spasms, impaired flexibility, improper body mechanics, postural dysfunction, and pain. scar restrictions   ACTIVITY LIMITATIONS  self-care, sitting, hiking   PARTICIPATION LIMITATIONS:  community   PERSONAL FACTORS   affecting patient's functional outcome:    REHAB POTENTIAL: Good   CLINICAL DECISION MAKING: Evolving/moderate complexity   EVALUATION COMPLEXITY: Moderate    PATIENT EDUCATION:    Education details: Showed pt anatomy images. Explained muscles attachments/ connection, physiology of deep core system/ spinal- thoracic-pelvis-lower kinetic chain as they relate to  pt's presentation, Sx, and past Hx. Explained what and how these areas of deficits need to be restored to balance and function    See Therapeutic activity / neuromuscular re-education section  Answered pt's questions.   Person educated: Patient Education method: Explanation, Demonstration, Tactile cues, Verbal cues, and Handouts Education comprehension: verbalized understanding, returned demonstration, verbal cues required, tactile cues required, and needs further education     PLAN: PT FREQUENCY: 1x/week   PT DURATION: 10 weeks   PLANNED INTERVENTIONS:   Gait training;Stair training;Functional mobility training;DME Instruction;Therapeutic activities;Therapeutic exercise;Balance training;Neuromuscular re-education;Patient/family education;Vestibular;Visual/perceptual remediation/compensation;Passive range of motion;Moist Heat;Cryotherapy;Traction;Canalith Repostioning;Joint Manipulations;Manual lymph drainage;Manual techniques;Scar mobilization;Energy conservation;Dry needling;ADLs/Self Care Home Management;Biofeedback;Electrical Stimulation;Taping    PLAN FOR NEXT SESSION: See clinical impression for plan     GOALS: Goals reviewed with patient? Yes  SHORT TERM GOALS: Target date: 11/13/2023    Pt will demo IND with HEP                    Baseline: Not IND            Goal status: MET   LONG TERM GOALS: Target date:02/29/2024      1.Pt will demo proper deep core coordination without chest breathing and optimal excursion of diaphragm/pelvic floor  and demo proper movement of pelvic floor for upward position of pelvic organs in order to promote spinal stability and pelvic floor function  Baseline: dyscoordination Goal status: MET   2.  Pt will demo proper body mechanics in against gravity tasks and ADLs  work tasks, fitness  to minimize straining pelvic floor / back    Baseline: not IND, improper form that places strain on pelvic floor  Goal status:  MET     3. Pt will  demo increased gait speed > 1.4 m/s with reciprocal gait pattern, longer stride length  in order to ambulate safely in community and return to fitness routine  Baseline:1.28 m/s,  Pelvic sway to R, limited R thoracic posterior rotaion  Goal status: MET ( 11/30/23:  2 m/s )     4. Pt will demo levelled pelvic girdle and shoulder height in order to progress to deep core strengthening HEP and restore mobility at spine, pelvis, gait, posture minimize falls, and return to hiking  Baseline: R shoulder /  iliac crest lowered  Goal status: MET    5. Pt will improve PFDI-7 questionnaire to  pts  score change  to demo improved QOL  Baseline:    ( greater pts indicate greater negative impact on QOL)    38 pts  ( total)   ---> 16    33  pts  ( UIQ-7 )   --> 10   0 pts  ( CRAIQ-7 )  --> 0  5   pts  ( POPIQ-7 )  --> 0  Baseline: plan to adminster  Goal status: MET     6.  Pt will report: Stool type 4   > 50% of the time   Baseline:  Type 4 across 10-15 %   of the time, Type 3 - 85-90% of the time Goal status: MET  Stool Type 4 at 100% of the time   7. Pt will report able to stand 1 hour and experience no LBP  in order to attend community events  Baseline:standing for 30 min and the pain starts and 3/10  Goal status: MET ( standing 2 hour without LBP until the end and it was at a lower level of pain)     8. Pt will demo correct activation of all 3 layers of pelvic floor circumferentially/ sequential manner to help promote upward position of pelvic organs and report decreased prolapse Sx with return to hip hop step fitness class  [place]: poor activation of all 3 layers of pelvic floor circumferentially/ sequential  Goal Status: Not assessed    Pia Lupe Plump, PT 01/30/2024, 3:55 PM

## 2024-02-16 ENCOUNTER — Ambulatory Visit: Payer: Self-pay | Admitting: Physician Assistant

## 2024-02-16 ENCOUNTER — Ambulatory Visit: Payer: Self-pay

## 2024-02-16 VITALS — BP 150/92 | HR 67 | Ht 66.0 in | Wt 173.0 lb

## 2024-02-16 DIAGNOSIS — N3946 Mixed incontinence: Secondary | ICD-10-CM | POA: Diagnosis not present

## 2024-02-16 DIAGNOSIS — N390 Urinary tract infection, site not specified: Secondary | ICD-10-CM | POA: Diagnosis not present

## 2024-02-16 DIAGNOSIS — R3129 Other microscopic hematuria: Secondary | ICD-10-CM

## 2024-02-16 LAB — URINALYSIS, COMPLETE
Bilirubin, UA: NEGATIVE
Glucose, UA: NEGATIVE
Ketones, UA: NEGATIVE
Leukocytes,UA: NEGATIVE
Nitrite, UA: NEGATIVE
Protein,UA: NEGATIVE
Specific Gravity, UA: 1.02 (ref 1.005–1.030)
Urobilinogen, Ur: 0.2 mg/dL (ref 0.2–1.0)
pH, UA: 6 (ref 5.0–7.5)

## 2024-02-16 LAB — MICROSCOPIC EXAMINATION

## 2024-02-16 NOTE — Progress Notes (Signed)
 02/16/2024 9:26 AM   Tiffany Zamora 1970/05/26 [id number]  CC: Chief Complaint  Patient presents with   Follow-up   Hematuria   HPI: Tiffany Zamora is a 53 y.o. female with PMH microscopic hematuria with benign workup in 2023, recurrent UTI on estrogen cream, IBS-C, and OAB wet with mixed urge and stress incontinence who completed pelvic floor PT who presents today for annual follow-up.   Today she reports only one UTI this year, which she associates with the pelvic exam during pelvic PT.  She recently completed a round of pelvic floor PT and has had significant improvement in her urinary incontinence, back pain, and coccygeal pain since.  She feels her results have been amazing and is very pleased.  She still has occasional urgency and leakage, but is minimally bothered by these.  She denies gross hematuria, dysuria, or flank pain.  In-office UA today positive for 3+ blood; urine microscopy with 11-30 RBCs/HPF and many bacteria.   PMH: Past Medical History:  Diagnosis Date   Benign hematuria ~2005   per pt normal workup with cystoscopy ([place] )   Heartburn    IBS (irritable bowel syndrome) 09/25/2015   Seasonal allergies    Urine incontinence     Surgical History: Past Surgical History:  Procedure Laterality Date   COLONOSCOPY  2000   for IBS sxs, WNL per patient   COLONOSCOPY WITH PROPOFOL  N/A 05/18/2020   Procedure: COLONOSCOPY WITH PROPOFOL ;  Surgeon: Unk Corinn Skiff, MD;  Location: [facility] ENDOSCOPY;  Service: Gastroenterology;  Laterality: N/A;   DILATION AND CURETTAGE OF UTERUS  08/2007   WISDOM TOOTH EXTRACTION  1993    Home Medications:  Allergies as of 02/16/2024   No Known Allergies      Medication List        Accurate as of February 16, 2024  9:26 AM. If you have any questions, ask your nurse or doctor.          clindamycin  1 % gel Commonly known as: CLINDAGEL Apply topically 2 (two) times daily.   CombiPatch 0.05-0.14  MG/DAY Generic drug: estradiol-norethindrone Place 1 patch onto the skin 2 (two) times a week.   estradiol 0.1 MG/GM vaginal cream Commonly known as: ESTRACE Insert 0.5 applicatorsful twice a week by vaginal route for 84 days.   melatonin 3 MG Tabs tablet Take 3 mg by mouth at bedtime.   Vitamin D  (Ergocalciferol ) 1.25 MG (50000 UNIT) Caps capsule Commonly known as: DRISDOL  Take 1 capsule (50,000 Units total) by mouth every 7 (seven) days.        Allergies:  Allergies[1]  Family History: Family History  Problem Relation Age of Onset   Diabetes Sister    Heart attack Maternal Grandfather 66       MI   Hypertension Mother    Tongue cancer Mother        prior smoker   Hypertension Father    Dementia Maternal Grandmother 95       died at 52   Cancer Neg Hx    Stroke Neg Hx     Social History:   reports that she has never smoked. She has never been exposed to tobacco smoke. She has never used smokeless tobacco. She reports current alcohol use. She reports that she does not use drugs.  Physical Exam: BP (!) 150/92 (BP Location: Left Arm, Patient Position: Sitting, Cuff Size: Normal)   Pulse 67   Ht 5' 6 (1.676 m)   Wt 173 lb (78.5  kg)   LMP 02/02/2022   SpO2 99%   BMI 27.92 kg/m   Constitutional:  Alert and oriented, no acute distress, nontoxic appearing HEENT: [place], AT Cardiovascular: No clubbing, cyanosis, or edema Respiratory: Normal respiratory effort, no increased work of breathing Skin: No rashes, bruises or suspicious lesions Neurologic: Grossly intact, no focal deficits, moving all 4 extremities Psychiatric: Normal mood and affect  Laboratory Data: See epic  Assessment & Plan:   1. Mixed incontinence (Primary) Significant improvement after pelvic floor PT.  She is very pleased.  2. Microscopic hematuria Stable, no warning signs.  She prefers to follow-up with us  as needed, and we reiterated warning signs including gross hematuria, dysuria, and flank  pain. - Urinalysis, Complete  3. Recurrent UTI No longer meeting diagnostic criteria.  She may follow-up with us  as needed.  Return if symptoms worsen or fail to improve.  Lucie Hones, PA-C  [facility] [place] [address] [place], [place] [address] [phone]      [1] No Known Allergies

## 2024-02-16 NOTE — Telephone Encounter (Signed)
 FYI Only or Action Required?: FYI only for provider: home care advised, pt agreeable to call back if symptoms persist or worsen.  Patient was last seen in primary care on 01/02/2024 by Bernardo Fend, DO.  Called Nurse Triage reporting Ear Fullness.  Symptoms began 6 days ago.  Interventions attempted: Rest, hydration, or home remedies.  Symptoms are: stable.  Triage Disposition: Home Care  Patient/caregiver understands and will follow disposition?: Yes    Copied from CRM #[id number]. Topic: Clinical - Red Word Triage >> Feb 16, 2024  9:01 AM Adelita E wrote: [facility] that prompted transfer to Nurse Triage: Stuffy ears, and worsening cold. Patient stated that she does have a little productive cough. >> Feb 16, 2024  9:09 AM Alexandria E wrote: Patient is currently walking into another doctor's appointment, and would appreciate a call back from a nurse. Reason for Disposition  Ear congestion  Answer Assessment - Initial Assessment Questions Pt reports ears popped prior to return call and she is feeling better, declines OV at this time and will return call if symptoms worsen. Denies SOB.   1. LOCATION: Which ear is involved?       Bilateral 2. SENSATION: Describe how the ear feels. (e.g., stuffy, full, plugged).      Plugged 3. ONSET:  When did the ear symptoms start?       X 6 days 4. PAIN: Do you also have an earache? If Yes, ask: How bad is it? (Scale 0-10; none, mild, moderate or severe)     Deferred 5. CAUSE: What do you think is causing the ear congestion? (e.g., common cold, nasal allergies, recent flight, recent snorkeling)     Pt believes cold/allergies 6. OTHER SYMPTOMS: Do you have any other symptoms? (e.g., ear drainage, hay fever symptoms such as sneezing or a clear nasal discharge; cold symptoms such as a cough or runny nose)     Runny nose, sneezing  Protocols used: Ear - Congestion-A-AH

## 2024-11-28 ENCOUNTER — Encounter: Admitting: Internal Medicine
